# Patient Record
Sex: Female | Born: 1960
Health system: Southern US, Community
[De-identification: ages and names within clinical notes are randomized; demographics above are authoritative.]

## PROBLEM LIST (undated history)

## (undated) DIAGNOSIS — K635 Polyp of colon: Secondary | ICD-10-CM

## (undated) DIAGNOSIS — H269 Unspecified cataract: Secondary | ICD-10-CM

## (undated) DIAGNOSIS — M810 Age-related osteoporosis without current pathological fracture: Secondary | ICD-10-CM

## (undated) DIAGNOSIS — I10 Essential (primary) hypertension: Secondary | ICD-10-CM

## (undated) DIAGNOSIS — F32A Depression, unspecified: Secondary | ICD-10-CM

## (undated) DIAGNOSIS — K219 Gastro-esophageal reflux disease without esophagitis: Secondary | ICD-10-CM

## (undated) DIAGNOSIS — K900 Celiac disease: Secondary | ICD-10-CM

## (undated) DIAGNOSIS — M1990 Unspecified osteoarthritis, unspecified site: Secondary | ICD-10-CM

## (undated) DIAGNOSIS — F329 Major depressive disorder, single episode, unspecified: Secondary | ICD-10-CM

## (undated) DIAGNOSIS — R7303 Prediabetes: Secondary | ICD-10-CM

## (undated) DIAGNOSIS — F419 Anxiety disorder, unspecified: Secondary | ICD-10-CM

## (undated) DIAGNOSIS — G4730 Sleep apnea, unspecified: Secondary | ICD-10-CM

## (undated) HISTORY — DX: Polyp of colon: K63.5

## (undated) HISTORY — DX: Unspecified cataract: H26.9

## (undated) HISTORY — PX: ESOPHAGOGASTRODUODENOSCOPY: SHX1529

## (undated) HISTORY — PX: OTHER SURGICAL HISTORY: SHX169

## (undated) HISTORY — DX: Gastro-esophageal reflux disease without esophagitis: K21.9

## (undated) HISTORY — DX: Celiac disease: K90.0

## (undated) HISTORY — DX: Sleep apnea, unspecified: G47.30

## (undated) HISTORY — PX: EYE SURGERY: SHX253

## (undated) HISTORY — DX: Anxiety disorder, unspecified: F41.9

## (undated) HISTORY — PX: ABDOMINAL HYSTERECTOMY: SHX81

## (undated) HISTORY — PX: WRIST FRACTURE SURGERY: SHX121

## (undated) HISTORY — DX: Age-related osteoporosis without current pathological fracture: M81.0

---
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
## 1988-01-02 HISTORY — PX: BACK SURGERY: SHX140

## 1990-01-01 HISTORY — PX: ABDOMINAL HYSTERECTOMY: SHX81

## 2003-01-29 ENCOUNTER — Emergency Department (HOSPITAL_COMMUNITY): Admission: EM | Admit: 2003-01-29 | Discharge: 2003-01-29 | Payer: Self-pay | Admitting: Emergency Medicine

## 2003-02-22 ENCOUNTER — Ambulatory Visit (HOSPITAL_COMMUNITY): Admission: RE | Admit: 2003-02-22 | Discharge: 2003-02-23 | Payer: Self-pay | Admitting: Neurosurgery

## 2003-02-25 ENCOUNTER — Emergency Department (HOSPITAL_COMMUNITY): Admission: EM | Admit: 2003-02-25 | Discharge: 2003-02-25 | Payer: Self-pay | Admitting: Emergency Medicine

## 2015-04-28 DIAGNOSIS — B009 Herpesviral infection, unspecified: Secondary | ICD-10-CM | POA: Insufficient documentation

## 2015-04-28 DIAGNOSIS — N390 Urinary tract infection, site not specified: Secondary | ICD-10-CM | POA: Insufficient documentation

## 2015-04-28 DIAGNOSIS — I10 Essential (primary) hypertension: Secondary | ICD-10-CM | POA: Insufficient documentation

## 2017-04-08 ENCOUNTER — Other Ambulatory Visit: Payer: Self-pay | Admitting: Orthopedic Surgery

## 2017-05-03 ENCOUNTER — Encounter (HOSPITAL_COMMUNITY): Admission: RE | Payer: Self-pay | Source: Ambulatory Visit

## 2017-05-03 ENCOUNTER — Inpatient Hospital Stay (HOSPITAL_COMMUNITY): Admission: RE | Admit: 2017-05-03 | Payer: Self-pay | Source: Ambulatory Visit | Admitting: Orthopedic Surgery

## 2017-05-03 SURGERY — ARTHROPLASTY, KNEE, TOTAL
Anesthesia: Spinal | Site: Knee | Laterality: Right

## 2017-05-03 SURGERY — Surgical Case
Anesthesia: *Unknown

## 2017-09-10 DIAGNOSIS — R079 Chest pain, unspecified: Secondary | ICD-10-CM

## 2018-05-29 DIAGNOSIS — Z7189 Other specified counseling: Secondary | ICD-10-CM | POA: Diagnosis not present

## 2018-05-29 DIAGNOSIS — L089 Local infection of the skin and subcutaneous tissue, unspecified: Secondary | ICD-10-CM | POA: Diagnosis not present

## 2018-06-17 DIAGNOSIS — M79601 Pain in right arm: Secondary | ICD-10-CM | POA: Diagnosis not present

## 2018-06-17 DIAGNOSIS — S6991XA Unspecified injury of right wrist, hand and finger(s), initial encounter: Secondary | ICD-10-CM | POA: Diagnosis not present

## 2018-06-17 DIAGNOSIS — M79631 Pain in right forearm: Secondary | ICD-10-CM | POA: Diagnosis not present

## 2018-06-17 DIAGNOSIS — J029 Acute pharyngitis, unspecified: Secondary | ICD-10-CM | POA: Diagnosis not present

## 2018-06-17 DIAGNOSIS — S40021A Contusion of right upper arm, initial encounter: Secondary | ICD-10-CM | POA: Diagnosis not present

## 2018-06-19 DIAGNOSIS — M1711 Unilateral primary osteoarthritis, right knee: Secondary | ICD-10-CM | POA: Diagnosis not present

## 2018-06-19 DIAGNOSIS — M25562 Pain in left knee: Secondary | ICD-10-CM | POA: Diagnosis not present

## 2018-06-19 DIAGNOSIS — M25561 Pain in right knee: Secondary | ICD-10-CM | POA: Diagnosis not present

## 2018-06-19 DIAGNOSIS — M1712 Unilateral primary osteoarthritis, left knee: Secondary | ICD-10-CM | POA: Diagnosis not present

## 2018-06-24 DIAGNOSIS — S40021A Contusion of right upper arm, initial encounter: Secondary | ICD-10-CM | POA: Diagnosis not present

## 2018-06-24 DIAGNOSIS — J029 Acute pharyngitis, unspecified: Secondary | ICD-10-CM | POA: Diagnosis not present

## 2018-06-24 DIAGNOSIS — M79601 Pain in right arm: Secondary | ICD-10-CM | POA: Diagnosis not present

## 2018-07-03 ENCOUNTER — Other Ambulatory Visit: Payer: Self-pay | Admitting: Orthopedic Surgery

## 2018-07-08 DIAGNOSIS — F3341 Major depressive disorder, recurrent, in partial remission: Secondary | ICD-10-CM | POA: Diagnosis not present

## 2018-07-14 NOTE — Patient Instructions (Addendum)
YOU NEED TO HAVE A COVID 19 TEST ON 07-15-2018 AT 1105 AM. THIS TEST MUST BE DONE BEFORE SURGERY, COME TO [place] ENTRANCE. ONCE YOUR COVID TEST IS COMPLETED, PLEASE BEGIN THE QUARANTINE INSTRUCTIONS AS OUTLINED IN YOUR HANDOUT.                Connie Garrett    Your procedure is scheduled on: 07-18-2018   Report to [facility]  Entrance    Report to Admitting at 1255 PM     Call this number if you have problems the morning of surgery [phone]    Remember:  NO SOLID FOOD AFTER MIDNIGHT THE NIGHT PRIOR TO SURGERY. NOTHING BY MOUTH EXCEPT CLEAR LIQUIDS UNTIL 1220 PM. PLEASE FINISH ENSURE DRINK PER SURGEON ORDER 3 HOURS PRIOR TO SCHEDULED SURGERY TIME WHICH NEEDS TO BE COMPLETED AT 1220 PM.  CLEAR LIQUID DIET   Foods Allowed                                                                     Foods Excluded  Coffee and tea, regular and decaf                             liquids that you cannot  Plain Jell-O in any flavor                                             see through such as: Fruit ices (not with fruit pulp)                                     milk, soups, orange juice  Iced Popsicles                                    All solid food Carbonated beverages, regular and diet                                    Cranberry, grape and apple juices Sports drinks like Gatorade Lightly seasoned clear broth or consume(fat free) Sugar, honey syrup  Sample Menu Breakfast                                Lunch                                     Supper Cranberry juice                    Beef broth                            Chicken broth Jell-O  Grape juice                           Apple juice Coffee or tea                        Jell-O                                      Popsicle                                                Coffee or tea                        Coffee or  tea  _____________________________________________________________________     Take these medicines the morning of surgery with A SIP OF WATER: Bupropion (Wellbutrin), Desvenlafaxine (Pristiq), Escitalopram (Lexapro), and Omeprazole (Prilosec)  BRUSH YOUR TEETH MORNING OF SURGERY AND RINSE YOUR MOUTH OUT, NO CHEWING GUM CANDY OR MINTS.                                You may not have any metal on your body including hair pins and              piercings     Do not wear jewelry, make-up, lotions, powders or perfumes, deodorant               Do not wear nail polish.  Do not shave  48 hours prior to surgery.              Do not bring valuables to the hospital. [place].  Contacts, dentures or bridgework may not be worn into surgery.      _____________________________________________________________________             [facility] - Preparing for Surgery Before surgery, you can play an important role.  Because skin is not sterile, your skin needs to be as free of germs as possible.  You can reduce the number of germs on your skin by washing with CHG (chlorahexidine gluconate) soap before surgery.  CHG is an antiseptic cleaner which kills germs and bonds with the skin to continue killing germs even after washing. Please DO NOT use if you have an allergy to CHG or antibacterial soaps.  If your skin becomes reddened/irritated stop using the CHG and inform your nurse when you arrive at Short Stay. Do not shave (including legs and underarms) for at least 48 hours prior to the first CHG shower.  You may shave your face/neck. Please follow these instructions carefully:  1.  Shower with CHG Soap the night before surgery and the  morning of Surgery.  2.  If you choose to wash your hair, wash your hair first as usual with your  normal  shampoo.  3.  After you shampoo, rinse your hair and body thoroughly to remove the  shampoo.  4.  Use CHG as you would any other liquid soap.  You can apply chg directly  to the skin and wash                       Gently with a scrungie or clean washcloth.  5.  Apply the CHG Soap to your body ONLY FROM THE NECK DOWN.   Do not use on face/ open                           Wound or open sores. Avoid contact with eyes, ears mouth and genitals (private parts).                       Wash face,  Genitals (private parts) with your normal soap.             6.  Wash thoroughly, paying special attention to the area where your surgery  will be performed.  7.  Thoroughly rinse your body with warm water from the neck down.  8.  DO NOT shower/wash with your normal soap after using and rinsing off  the CHG Soap.                9.  Pat yourself dry with a clean towel.            10.  Wear clean pajamas.            11.  Place clean sheets on your bed the night of your first shower and do not  sleep with pets. Day of Surgery : Do not apply any lotions/deodorants the morning of surgery.  Please wear clean clothes to the hospital/surgery center.  FAILURE TO FOLLOW THESE INSTRUCTIONS MAY RESULT IN THE CANCELLATION OF YOUR SURGERY PATIENT SIGNATURE_________________________________  NURSE SIGNATURE__________________________________  ________________________________________________________________________   Connie Garrett  An incentive spirometer is a tool that can help keep your lungs clear and active. This tool measures how well you are filling your lungs with each breath. Taking long deep breaths may help reverse or decrease the chance of developing breathing (pulmonary) problems (especially infection) following:  A long period of time when you are unable to move or be active. BEFORE THE PROCEDURE   If the spirometer includes an indicator to show your best effort, your nurse or respiratory therapist will set it to a desired goal.  If possible, sit up straight or lean slightly forward. Try  not to slouch.  Hold the incentive spirometer in an upright position. INSTRUCTIONS FOR USE  1. Sit on the edge of your bed if possible, or sit up as far as you can in bed or on a chair. 2. Hold the incentive spirometer in an upright position. 3. Breathe out normally. 4. Place the mouthpiece in your mouth and seal your lips tightly around it. 5. Breathe in slowly and as deeply as possible, raising the piston or the ball toward the top of the column. 6. Hold your breath for 3-5 seconds or for as long as possible. Allow the piston or ball to fall to the bottom of the column. 7. Remove the mouthpiece from your mouth and breathe out normally. 8. Rest for a few seconds and repeat Steps 1 through 7 at least 10 times every 1-2 hours when you are awake. Take your time and take a few normal breaths between deep breaths. 9. The spirometer may include an indicator to show  your best effort. Use the indicator as a goal to work toward during each repetition. 10. After each set of 10 deep breaths, practice coughing to be sure your lungs are clear. If you have an incision (the cut made at the time of surgery), support your incision when coughing by placing a pillow or rolled up towels firmly against it. Once you are able to get out of bed, walk around indoors and cough well. You may stop using the incentive spirometer when instructed by your caregiver.  RISKS AND COMPLICATIONS  Take your time so you do not get dizzy or light-headed.  If you are in pain, you may need to take or ask for pain medication before doing incentive spirometry. It is harder to take a deep breath if you are having pain. AFTER USE  Rest and breathe slowly and easily.  It can be helpful to keep track of a log of your progress. Your caregiver can provide you with a simple table to help with this. If you are using the spirometer at home, follow these instructions: [place] IF:   You are having difficultly using the  spirometer.  You have trouble using the spirometer as often as instructed.  Your pain medication is not giving enough relief while using the spirometer.  You develop fever of 100.5 F (38.1 C) or higher. SEEK IMMEDIATE MEDICAL CARE IF:   You cough up bloody sputum that had not been present before.  You develop fever of 102 F (38.9 C) or greater.  You develop worsening pain at or near the incision site. MAKE SURE YOU:   Understand these instructions.  Will watch your condition.  Will get help right away if you are not doing well or get worse. Document Released: 04/30/2006 Document Revised: 03/12/2011 Document Reviewed: 07/01/2006 ExitCare Patient Information 2014 ExitCare, [place].   ________________________________________________________________________  WHAT IS A BLOOD TRANSFUSION? Blood Transfusion Information  A transfusion is the replacement of blood or some of its parts. Blood is made up of multiple cells which provide different functions.  Red blood cells carry oxygen and are used for blood loss replacement.  White blood cells fight against infection.  Platelets control bleeding.  Plasma helps clot blood.  Other blood products are available for specialized needs, such as hemophilia or other clotting disorders. BEFORE THE TRANSFUSION  Who gives blood for transfusions?   Healthy volunteers who are fully evaluated to make sure their blood is safe. This is blood bank blood. Transfusion therapy is the safest it has ever been in the practice of medicine. Before blood is taken from a donor, a complete history is taken to make sure that person has no history of diseases nor engages in risky social behavior (examples are intravenous drug use or sexual activity with multiple partners). The donor's travel history is screened to minimize risk of transmitting infections, such as malaria. The donated blood is tested for signs of infectious diseases, such as HIV and hepatitis.  The blood is then tested to be sure it is compatible with you in order to minimize the chance of a transfusion reaction. If you or a relative donates blood, this is often done in anticipation of surgery and is not appropriate for emergency situations. It takes many days to process the donated blood. RISKS AND COMPLICATIONS Although transfusion therapy is very safe and saves many lives, the main dangers of transfusion include:   Getting an infectious disease.  Developing a transfusion reaction. This is an allergic reaction to  something in the blood you were given. Every precaution is taken to prevent this. The decision to have a blood transfusion has been considered carefully by your caregiver before blood is given. Blood is not given unless the benefits outweigh the risks. AFTER THE TRANSFUSION  Right after receiving a blood transfusion, you will usually feel much better and more energetic. This is especially true if your red blood cells have gotten low (anemic). The transfusion raises the level of the red blood cells which carry oxygen, and this usually causes an energy increase.  The nurse administering the transfusion will monitor you carefully for complications. HOME CARE INSTRUCTIONS  No special instructions are needed after a transfusion. You may find your energy is better. Speak with your caregiver about any limitations on activity for underlying diseases you may have. SEEK MEDICAL CARE IF:   Your condition is not improving after your transfusion.  You develop redness or irritation at the intravenous (IV) site. SEEK IMMEDIATE MEDICAL CARE IF:  Any of the following symptoms occur over the next 12 hours:  Shaking chills.  You have a temperature by mouth above 102 F (38.9 C), not controlled by medicine.  Chest, back, or muscle pain.  People around you feel you are not acting correctly or are confused.  Shortness of breath or difficulty breathing.  Dizziness and fainting.  You  get a rash or develop hives.  You have a decrease in urine output.  Your urine turns a dark color or changes to pink, red, or brown. Any of the following symptoms occur over the next 10 days:  You have a temperature by mouth above 102 F (38.9 C), not controlled by medicine.  Shortness of breath.  Weakness after normal activity.  The white part of the eye turns yellow (jaundice).  You have a decrease in the amount of urine or are urinating less often.  Your urine turns a dark color or changes to pink, red, or brown. Document Released: 12/16/1999 Document Revised: 03/12/2011 Document Reviewed: 08/04/2007 [facility] Patient Information 2014 [place], [place].  _______________________________________________________________________

## 2018-07-15 ENCOUNTER — Other Ambulatory Visit (HOSPITAL_COMMUNITY)
Admission: RE | Admit: 2018-07-15 | Discharge: 2018-07-15 | Disposition: A | Discharge status: Home / Self Care | Payer: Medicare Other | Source: Ambulatory Visit

## 2018-07-15 ENCOUNTER — Ambulatory Visit (HOSPITAL_COMMUNITY)
Admission: RE | Admit: 2018-07-15 | Discharge: 2018-07-15 | Disposition: A | Discharge status: Home / Self Care | Payer: Medicare Other | Source: Ambulatory Visit | Attending: Orthopedic Surgery | Admitting: Orthopedic Surgery

## 2018-07-15 ENCOUNTER — Other Ambulatory Visit: Payer: Self-pay

## 2018-07-15 ENCOUNTER — Encounter (HOSPITAL_COMMUNITY): Payer: Self-pay

## 2018-07-15 ENCOUNTER — Encounter (HOSPITAL_COMMUNITY)
Admission: RE | Admit: 2018-07-15 | Discharge: 2018-07-15 | Disposition: A | Discharge status: Home / Self Care | Payer: Medicare Other | Source: Ambulatory Visit | Attending: Orthopedic Surgery | Admitting: Orthopedic Surgery

## 2018-07-15 DIAGNOSIS — Z01818 Encounter for other preprocedural examination: Secondary | ICD-10-CM | POA: Diagnosis not present

## 2018-07-15 DIAGNOSIS — M1711 Unilateral primary osteoarthritis, right knee: Secondary | ICD-10-CM | POA: Diagnosis not present

## 2018-07-15 DIAGNOSIS — Z96659 Presence of unspecified artificial knee joint: Secondary | ICD-10-CM | POA: Diagnosis not present

## 2018-07-15 DIAGNOSIS — Z01811 Encounter for preprocedural respiratory examination: Secondary | ICD-10-CM

## 2018-07-15 DIAGNOSIS — Z1159 Encounter for screening for other viral diseases: Secondary | ICD-10-CM | POA: Diagnosis not present

## 2018-07-15 HISTORY — DX: Essential (primary) hypertension: I10

## 2018-07-15 HISTORY — DX: Depression, unspecified: F32.A

## 2018-07-15 HISTORY — DX: Unspecified osteoarthritis, unspecified site: M19.90

## 2018-07-15 LAB — CBC WITH DIFFERENTIAL/PLATELET
Abs Immature Granulocytes: 0.02 10*3/uL (ref 0.00–0.07)
Basophils Absolute: 0 10*3/uL (ref 0.0–0.1)
Basophils Relative: 1 %
Eosinophils Absolute: 0.2 10*3/uL (ref 0.0–0.5)
Eosinophils Relative: 3 %
HCT: 38.6 % (ref 36.0–46.0)
Hemoglobin: 12.2 g/dL (ref 12.0–15.0)
Immature Granulocytes: 0 %
Lymphocytes Relative: 39 %
Lymphs Abs: 2.7 10*3/uL (ref 0.7–4.0)
MCH: 29.8 pg (ref 26.0–34.0)
MCHC: 31.6 g/dL (ref 30.0–36.0)
MCV: 94.1 fL (ref 80.0–100.0)
Monocytes Absolute: 0.7 10*3/uL (ref 0.1–1.0)
Monocytes Relative: 10 %
Neutro Abs: 3.4 10*3/uL (ref 1.7–7.7)
Neutrophils Relative %: 47 %
Platelets: 275 10*3/uL (ref 150–400)
RBC: 4.1 MIL/uL (ref 3.87–5.11)
RDW: 13.6 % (ref 11.5–15.5)
WBC: 7.1 10*3/uL (ref 4.0–10.5)
nRBC: 0 % (ref 0.0–0.2)

## 2018-07-15 LAB — COMPREHENSIVE METABOLIC PANEL
ALT: 15 U/L (ref 0–44)
AST: 16 U/L (ref 15–41)
Albumin: 4.2 g/dL (ref 3.5–5.0)
Alkaline Phosphatase: 67 U/L (ref 38–126)
Anion gap: 11 (ref 5–15)
BUN: 25 mg/dL — ABNORMAL HIGH (ref 6–20)
CO2: 27 mmol/L (ref 22–32)
Calcium: 9.6 mg/dL (ref 8.9–10.3)
Chloride: 102 mmol/L (ref 98–111)
Creatinine, Ser: 0.86 mg/dL (ref 0.44–1.00)
GFR calc Af Amer: >60 mL/min (ref >60)
GFR calc non Af Amer: >60 mL/min (ref >60)
Glucose, Bld: 110 mg/dL — ABNORMAL HIGH (ref 70–99)
Potassium: 4.7 mmol/L (ref 3.5–5.1)
Sodium: 140 mmol/L (ref 135–145)
Total Bilirubin: 0.8 mg/dL (ref 0.3–1.2)
Total Protein: 7.5 g/dL (ref 6.5–8.1)

## 2018-07-15 LAB — URINALYSIS, ROUTINE W REFLEX MICROSCOPIC
Bilirubin Urine: NEGATIVE
Glucose, UA: NEGATIVE mg/dL
Hgb urine dipstick: NEGATIVE
Ketones, ur: NEGATIVE mg/dL
Nitrite: NEGATIVE
Protein, ur: NEGATIVE mg/dL
Specific Gravity, Urine: 1.006 (ref 1.005–1.030)
pH: 7 (ref 5.0–8.0)

## 2018-07-15 LAB — ABO/RH: ABO/RH(D): A POS

## 2018-07-15 LAB — PROTIME-INR
INR: 1 (ref 0.8–1.2)
Prothrombin Time: 13.4 seconds (ref 11.4–15.2)

## 2018-07-15 LAB — SURGICAL PCR SCREEN
MRSA, PCR: NEGATIVE
Staphylococcus aureus: POSITIVE — AB

## 2018-07-15 LAB — APTT: aPTT: 28 seconds (ref 24–36)

## 2018-07-15 LAB — SARS CORONAVIRUS 2 (TAT 6-24 HRS): SARS Coronavirus 2: NEGATIVE

## 2018-07-15 NOTE — Progress Notes (Signed)
07-15-18 CMP and UA routed to Dr. Berenice Primas for review

## 2018-07-17 DIAGNOSIS — M25561 Pain in right knee: Secondary | ICD-10-CM | POA: Diagnosis not present

## 2018-07-17 DIAGNOSIS — Z6830 Body mass index (BMI) 30.0-30.9, adult: Secondary | ICD-10-CM | POA: Diagnosis not present

## 2018-07-17 DIAGNOSIS — Z01818 Encounter for other preprocedural examination: Secondary | ICD-10-CM | POA: Diagnosis not present

## 2018-07-17 MED ORDER — BUPIVACAINE LIPOSOME 1.3 % IJ SUSP
20.0000 mL | INTRAMUSCULAR | Status: DC
  Filled 2018-07-17: qty 20

## 2018-07-17 NOTE — Progress Notes (Signed)
Pt advised that surgery time for her 07-18-18 procedure is now 1:15 PM in lieu of 3:24 PM. Pt to arrived to admitting at 10:45 AM, and to consume ERAS drink by 10:15 AM. Pt verbalized understanding.

## 2018-07-17 NOTE — H&P (Addendum)
TOTAL KNEE ADMISSION H&P  Patient is being admitted for right total knee arthroplasty.  Subjective:  Chief Complaint:right knee pain.  HPI: Connie Garrett, 58 y.o. female, has a history of pain and functional disability in the right knee due to arthritis and has failed non-surgical conservative treatments for greater than 12 weeks to includeNSAID's and/or analgesics, corticosteriod injections, viscosupplementation injections and activity modification.  Onset of symptoms was gradual, starting 3 years ago with gradually worsening course since that time. The patient noted no past surgery on the right knee(s).  Patient currently rates pain in the right knee(s) at 7 out of 10 with activity. Patient has night pain, worsening of pain with activity and weight bearing, pain that interferes with activities of daily living and joint swelling.  Patient has evidence of subchondral sclerosis, periarticular osteophytes and joint space narrowing by imaging studies. This patient has had failure of all reasonable conservative care. There is no active infection.  There are no active problems to display for this patient.  Past Medical History:  Diagnosis Date  . Arthritis   . Depression   . Hypertension     Past Surgical History:  Procedure Laterality Date  . ABDOMINAL HYSTERECTOMY    . BACK SURGERY  1990  . EYE SURGERY      Current Facility-Administered Medications  Medication Dose Route Frequency Provider Last Rate Last Dose  . [START ON 07/18/2018] bupivacaine liposome (EXPAREL) 1.3 % injection 266 mg  20 mL Other On Call to OR Dorna Leitz, MD       Current Outpatient Medications  Medication Sig Dispense Refill Last Dose  . acetaminophen (TYLENOL) 325 MG tablet Take 650 mg by mouth every 6 (six) hours as needed (for pain.).     Marland Kitchen buPROPion (WELLBUTRIN) 100 MG tablet Take 100 mg by mouth 2 (two) times daily.     . celecoxib (CELEBREX) 200 MG capsule Take 200 mg by mouth daily with breakfast.     .  desvenlafaxine (PRISTIQ) 50 MG 24 hr tablet Take 100 mg by mouth daily.     Marland Kitchen escitalopram (LEXAPRO) 20 MG tablet Take 20 mg by mouth daily.     . metoprolol succinate (TOPROL-XL) 25 MG 24 hr tablet Take 25 mg by mouth daily.     . Multiple Vitamin (MULTIVITAMIN WITH MINERALS) TABS tablet Take 1 tablet by mouth daily.     . naproxen (NAPROSYN) 500 MG tablet Take 500 mg by mouth 2 (two) times a day.     Marland Kitchen omeprazole (PRILOSEC) 20 MG capsule Take 20 mg by mouth daily.     Marland Kitchen topiramate (TOPAMAX) 25 MG tablet Take 25 mg by mouth 2 (two) times daily.      No Known Allergies  Social History   Tobacco Use  . Smoking status: Never Smoker  . Smokeless tobacco: Never Used  Substance Use Topics  . Alcohol use: Never    Frequency: Never    No family history on file.   ROS ROS: I have reviewed the patient's review of systems thoroughly and there are no positive responses as relates to the HPI. Objective:  Physical Exam  Vital signs in last 24 hours:    Vitals:   07/18/18 1034  BP: (!) 148/77  Pulse: 91  Resp: 16  Temp: 98.8 F (37.1 C)  SpO2: 99%   Well-developed well-nourished patient in no acute distress. Alert and oriented x3 HEENT:within normal limits Cardiac: Regular rate and rhythm Pulmonary: Lungs clear to auscultation Abdomen: Soft and  nontender.  Normal active bowel sounds  Musculoskeletal: (right knee: Painful range of motion.  Limited range of motion.  No stability.  Trace effusion.  Mild varus malalignment. Labs: Recent Results (from the past 2160 hour(s))  Urinalysis, Routine w reflex microscopic     Status: Abnormal   Collection Time: 07/15/18 10:03 AM  Result Value Ref Range   Color, Urine YELLOW YELLOW   APPearance HAZY (A) CLEAR   Specific Gravity, Urine 1.006 1.005 - 1.030   pH 7.0 5.0 - 8.0   Glucose, UA NEGATIVE NEGATIVE mg/dL   Hgb urine dipstick NEGATIVE NEGATIVE   Bilirubin Urine NEGATIVE NEGATIVE   Ketones, ur NEGATIVE NEGATIVE mg/dL   Protein, ur  NEGATIVE NEGATIVE mg/dL   Nitrite NEGATIVE NEGATIVE   Leukocytes,Ua LARGE (A) NEGATIVE   RBC / HPF 6-10 0 - 5 RBC/hpf   WBC, UA 21-50 0 - 5 WBC/hpf   Bacteria, UA FEW (A) NONE SEEN   Squamous Epithelial / LPF 6-10 0 - 5   Mucus PRESENT     Comment: Performed at [facility], [place] [address] [place], [place] [address]  Surgical pcr screen     Status: Abnormal   Collection Time: 07/15/18 10:04 AM   Specimen: Nasal Swab  Result Value Ref Range   MRSA, PCR NEGATIVE NEGATIVE   Staphylococcus aureus POSITIVE (A) NEGATIVE    Comment: (NOTE) The Xpert SA Assay (FDA approved for NASAL specimens in patients 45 years of age and older), is one component of a comprehensive surveillance program. It is not intended to diagnose infection nor to guide or monitor treatment. Performed at [facility], [place] [address] [place], [place] [address]   APTT     Status: None   Collection Time: 07/15/18 10:47 AM  Result Value Ref Range   aPTT 28 24 - 36 seconds    Comment: Performed at [facility], [place] [address] [place], [place] [address]  CBC WITH DIFFERENTIAL     Status: None   Collection Time: 07/15/18 10:47 AM  Result Value Ref Range   WBC 7.1 4.0 - 10.5 K/uL   RBC 4.10 3.87 - 5.11 MIL/uL   Hemoglobin 12.2 12.0 - 15.0 g/dL   HCT 38.6 36.0 - 46.0 %   MCV 94.1 80.0 - 100.0 fL   MCH 29.8 26.0 - 34.0 pg   MCHC 31.6 30.0 - 36.0 g/dL   RDW 13.6 11.5 - 15.5 %   Platelets 275 150 - 400 K/uL   nRBC 0.0 0.0 - 0.2 %   Neutrophils Relative % 47 %   Neutro Abs 3.4 1.7 - 7.7 K/uL   Lymphocytes Relative 39 %   Lymphs Abs 2.7 0.7 - 4.0 K/uL   Monocytes Relative 10 %   Monocytes Absolute 0.7 0.1 - 1.0 K/uL   Eosinophils Relative 3 %   Eosinophils Absolute 0.2 0.0 - 0.5 K/uL   Basophils Relative 1 %   Basophils Absolute 0.0 0.0 - 0.1 K/uL   Immature Granulocytes 0 %   Abs Immature Granulocytes 0.02 0.00 - 0.07 K/uL    Comment: Performed at [facility], [place] [address], [place], [place] [address]  Comprehensive metabolic panel     Status: Abnormal   Collection Time: 07/15/18 10:47 AM  Result Value Ref Range   Sodium 140 135 - 145 mmol/L   Potassium 4.7 3.5 - 5.1 mmol/L   Chloride 102 98 - 111 mmol/L   CO2 27 22 -  32 mmol/L   Glucose, Bld 110 (H) 70 - 99 mg/dL   BUN 25 (H) 6 - 20 mg/dL   Creatinine, Ser 0.86 0.44 - 1.00 mg/dL   Calcium 9.6 8.9 - 10.3 mg/dL   Total Protein 7.5 6.5 - 8.1 g/dL   Albumin 4.2 3.5 - 5.0 g/dL   AST 16 15 - 41 U/L   ALT 15 0 - 44 U/L   Alkaline Phosphatase 67 38 - 126 U/L   Total Bilirubin 0.8 0.3 - 1.2 mg/dL   GFR calc non Af Amer >60 >60 mL/min   GFR calc Af Amer >60 >60 mL/min   Anion gap 11 5 - 15    Comment: Performed at [facility], [place] [address], [place], [place] [address]  Protime-INR     Status: None   Collection Time: 07/15/18 10:47 AM  Result Value Ref Range   Prothrombin Time 13.4 11.4 - 15.2 seconds   INR 1.0 0.8 - 1.2    Comment: (NOTE) INR goal varies based on device and disease states. Performed at [facility], [place] [address] [place], [place] [address]   Type and screen Order type and screen if day of surgery is less than 15 days from draw of preadmission visit or order morning of surgery if day of surgery is greater than 6 days from preadmission visit.     Status: None   Collection Time: 07/15/18 10:47 AM  Result Value Ref Range   ABO/RH(D) A POS    Antibody Screen NEG    Sample Expiration 07/29/2018,2359    Extend sample reason      NO TRANSFUSIONS OR PREGNANCY IN THE PAST 3 MONTHS Performed at [place] [address] [place], [place] [address]   ABO/Rh     Status: None   Collection Time: 07/15/18 10:47 AM  Result Value Ref Range   ABO/RH(D)      A POS Performed at [facility], [place] [address] [place], [place] [address]   SARS Coronavirus 2 (Performed in [facility] hospital lab)     Status: None   Collection Time: 07/15/18 12:06 PM   Specimen: Nasal Swab  Result Value Ref Range   SARS Coronavirus 2 NEGATIVE NEGATIVE    Comment: (NOTE) SARS-CoV-2 target nucleic acids are NOT DETECTED. The SARS-CoV-2 RNA is generally detectable in upper and lower respiratory specimens during the acute phase of infection. Negative results do not preclude SARS-CoV-2 infection, do not rule out co-infections with other pathogens, and should not be used as the sole basis for treatment or other patient management decisions. Negative results must be combined with clinical observations, patient history, and epidemiological information. The expected result is Negative. Fact Sheet for Patients: SugarRoll.be Fact Sheet for Healthcare Providers: https://www.woods-mathews.com/ This test is not yet approved or cleared by the [place] FDA and  has been authorized for detection and/or diagnosis of SARS-CoV-2 by FDA under an Emergency Use Authorization (EUA). This EUA will remain  in effect (meaning this test can be used) for the duration of the COVID-19 declaration under Section 56 4(b)(1) of the Act, 21 U.S.C. section 360bbb-3(b)(1), unless the authorization is terminated or revoked sooner. Performed at [facility], [place] [address] [place], [place] [address]     Estimated body mass index is 31.05 kg/m as calculated from the following:   Height as of 07/15/18: 5' 5"  (1.651 m).   Weight as of 07/15/18: 84.6 kg.  Imaging Review Plain radiographs demonstrate severe degenerative joint disease of the right knee(s). The overall alignment ismild varus. The bone quality appears to be fair for age and reported activity level.      Assessment/Plan:  End stage arthritis, right knee   The patient history, physical examination, clinical judgment of the provider and imaging studies are consistent with end stage  degenerative joint disease of the right knee(s) and total knee arthroplasty is deemed medically necessary. The treatment options including medical management, injection therapy arthroscopy and arthroplasty were discussed at length. The risks and benefits of total knee arthroplasty were presented and reviewed. The risks due to aseptic loosening, infection, stiffness, patella tracking problems, thromboembolic complications and other imponderables were discussed. The patient acknowledged the explanation, agreed to proceed with the plan and consent was signed. Patient is being admitted for inpatient treatment for surgery, pain control, PT, OT, prophylactic antibiotics, VTE prophylaxis, progressive ambulation and ADL's and discharge planning. The patient is planning to be discharged home with home health services     Patient's anticipated LOS is less than 2 midnights, meeting these requirements: - Younger than 91 - Lives within 1 hour of care - Has a competent adult at home to recover with post-op recover - NO history of  - Chronic pain requiring opiods  - Diabetes  - Coronary Artery Disease  - Heart failure  - Heart attack  - Stroke  - DVT/VTE  - Cardiac arrhythmia  - Respiratory Failure/COPD  - Renal failure  - Anemia  - Advanced Liver disease

## 2018-07-18 ENCOUNTER — Ambulatory Visit (HOSPITAL_COMMUNITY)
Admission: RE | Admit: 2018-07-18 | Discharge: 2018-07-19 | Disposition: A | Discharge status: Home Health | Payer: Medicare Other | Attending: Orthopedic Surgery | Admitting: Orthopedic Surgery

## 2018-07-18 ENCOUNTER — Encounter (HOSPITAL_COMMUNITY)
Admission: RE | Disposition: A | Discharge status: Home Health | Payer: Self-pay | Source: Home / Self Care | Attending: Orthopedic Surgery

## 2018-07-18 ENCOUNTER — Other Ambulatory Visit: Payer: Self-pay

## 2018-07-18 ENCOUNTER — Ambulatory Visit (HOSPITAL_COMMUNITY): Payer: Medicare Other | Admitting: Emergency Medicine

## 2018-07-18 ENCOUNTER — Ambulatory Visit (HOSPITAL_COMMUNITY): Payer: Medicare Other | Admitting: Certified Registered Nurse Anesthetist

## 2018-07-18 ENCOUNTER — Encounter (HOSPITAL_COMMUNITY): Payer: Self-pay | Admitting: Certified Registered Nurse Anesthetist

## 2018-07-18 DIAGNOSIS — M25761 Osteophyte, right knee: Secondary | ICD-10-CM | POA: Diagnosis not present

## 2018-07-18 DIAGNOSIS — I10 Essential (primary) hypertension: Secondary | ICD-10-CM | POA: Diagnosis not present

## 2018-07-18 DIAGNOSIS — Z96651 Presence of right artificial knee joint: Secondary | ICD-10-CM | POA: Diagnosis not present

## 2018-07-18 DIAGNOSIS — M1711 Unilateral primary osteoarthritis, right knee: Secondary | ICD-10-CM | POA: Diagnosis present

## 2018-07-18 DIAGNOSIS — Z79899 Other long term (current) drug therapy: Secondary | ICD-10-CM | POA: Insufficient documentation

## 2018-07-18 DIAGNOSIS — Z791 Long term (current) use of non-steroidal anti-inflammatories (NSAID): Secondary | ICD-10-CM | POA: Insufficient documentation

## 2018-07-18 DIAGNOSIS — F329 Major depressive disorder, single episode, unspecified: Secondary | ICD-10-CM | POA: Diagnosis not present

## 2018-07-18 DIAGNOSIS — G8918 Other acute postprocedural pain: Secondary | ICD-10-CM | POA: Diagnosis not present

## 2018-07-18 DIAGNOSIS — M25561 Pain in right knee: Secondary | ICD-10-CM | POA: Diagnosis present

## 2018-07-18 HISTORY — PX: TOTAL KNEE ARTHROPLASTY: SHX125

## 2018-07-18 LAB — TYPE AND SCREEN
ABO/RH(D): A POS
Antibody Screen: NEGATIVE

## 2018-07-18 SURGERY — ARTHROPLASTY, KNEE, TOTAL
Anesthesia: Spinal | Site: Knee | Laterality: Right

## 2018-07-18 MED ORDER — DEXAMETHASONE SODIUM PHOSPHATE 10 MG/ML IJ SOLN
10.0000 mg | Freq: Two times a day (BID) | INTRAMUSCULAR | Status: DC
  Administered 2018-07-19: 11:00:00 10 mg via INTRAVENOUS
  Filled 2018-07-18: qty 1

## 2018-07-18 MED ORDER — BUPIVACAINE-EPINEPHRINE 0.5% -1:200000 IJ SOLN
INTRAMUSCULAR | Status: DC | PRN
  Administered 2018-07-18: 30 mL

## 2018-07-18 MED ORDER — BUPROPION HCL 100 MG PO TABS
100.0000 mg | ORAL_TABLET | Freq: Two times a day (BID) | ORAL | Status: DC
  Administered 2018-07-18 – 2018-07-19 (×2): 100 mg via ORAL
  Filled 2018-07-18 (×2): qty 1

## 2018-07-18 MED ORDER — GABAPENTIN 300 MG PO CAPS
300.0000 mg | ORAL_CAPSULE | Freq: Two times a day (BID) | ORAL | Status: DC
  Administered 2018-07-18 – 2018-07-19 (×2): 300 mg via ORAL
  Filled 2018-07-18 (×2): qty 1

## 2018-07-18 MED ORDER — CEFAZOLIN SODIUM-DEXTROSE 2-4 GM/100ML-% IV SOLN
2.0000 g | Freq: Four times a day (QID) | INTRAVENOUS | Status: AC
  Administered 2018-07-18 – 2018-07-19 (×2): 2 g via INTRAVENOUS
  Filled 2018-07-18 (×2): qty 100

## 2018-07-18 MED ORDER — TRANEXAMIC ACID-NACL 1000-0.7 MG/100ML-% IV SOLN
1000.0000 mg | Freq: Once | INTRAVENOUS | Status: AC
  Administered 2018-07-18: 1000 mg via INTRAVENOUS
  Filled 2018-07-18: qty 100

## 2018-07-18 MED ORDER — PROPOFOL 10 MG/ML IV BOLUS
INTRAVENOUS | Status: AC
  Filled 2018-07-18: qty 60

## 2018-07-18 MED ORDER — ONDANSETRON HCL 4 MG/2ML IJ SOLN
INTRAMUSCULAR | Status: AC
  Filled 2018-07-18: qty 2

## 2018-07-18 MED ORDER — DEXAMETHASONE SODIUM PHOSPHATE 10 MG/ML IJ SOLN
INTRAMUSCULAR | Status: AC
  Filled 2018-07-18: qty 1

## 2018-07-18 MED ORDER — POLYETHYLENE GLYCOL 3350 17 G PO PACK: 17.0000 g | PACK | Freq: Every day | ORAL | Status: DC | PRN

## 2018-07-18 MED ORDER — ROPIVACAINE HCL 7.5 MG/ML IJ SOLN
INTRAMUSCULAR | Status: DC | PRN
  Administered 2018-07-18: 20 mL via PERINEURAL

## 2018-07-18 MED ORDER — DOCUSATE SODIUM 100 MG PO CAPS
100.0000 mg | ORAL_CAPSULE | Freq: Two times a day (BID) | ORAL | Status: DC
  Administered 2018-07-18 – 2018-07-19 (×2): 100 mg via ORAL
  Filled 2018-07-18 (×2): qty 1

## 2018-07-18 MED ORDER — PHENYLEPHRINE 40 MCG/ML (10ML) SYRINGE FOR IV PUSH (FOR BLOOD PRESSURE SUPPORT)
PREFILLED_SYRINGE | INTRAVENOUS | Status: AC
  Filled 2018-07-18: qty 10

## 2018-07-18 MED ORDER — MAGNESIUM CITRATE PO SOLN: 1.0000 | Freq: Once | ORAL | Status: DC | PRN

## 2018-07-18 MED ORDER — PROPOFOL 500 MG/50ML IV EMUL
INTRAVENOUS | Status: DC | PRN
  Administered 2018-07-18: 100 ug/kg/min via INTRAVENOUS

## 2018-07-18 MED ORDER — MIDAZOLAM HCL 2 MG/2ML IJ SOLN
INTRAMUSCULAR | Status: AC
  Administered 2018-07-18: 2 mg
  Filled 2018-07-18: qty 2

## 2018-07-18 MED ORDER — SODIUM CHLORIDE 0.9% FLUSH
INTRAVENOUS | Status: DC | PRN
  Administered 2018-07-18: 50 mL

## 2018-07-18 MED ORDER — CEFAZOLIN SODIUM-DEXTROSE 2-4 GM/100ML-% IV SOLN
2.0000 g | INTRAVENOUS | Status: AC
  Administered 2018-07-18: 2 g via INTRAVENOUS
  Filled 2018-07-18: qty 100

## 2018-07-18 MED ORDER — CLONIDINE HCL (ANALGESIA) 100 MCG/ML EP SOLN
EPIDURAL | Status: DC | PRN
  Administered 2018-07-18: 80 ug

## 2018-07-18 MED ORDER — ONDANSETRON HCL 4 MG/2ML IJ SOLN: 4.0000 mg | Freq: Four times a day (QID) | INTRAMUSCULAR | Status: DC | PRN

## 2018-07-18 MED ORDER — POVIDONE-IODINE 10 % EX SWAB
2.0000 "application " | Freq: Once | CUTANEOUS | Status: AC
  Administered 2018-07-18: 2 via TOPICAL

## 2018-07-18 MED ORDER — SODIUM CHLORIDE 0.9 % IV SOLN
INTRAVENOUS | Status: DC
  Administered 2018-07-18 – 2018-07-19 (×2): via INTRAVENOUS

## 2018-07-18 MED ORDER — SODIUM CHLORIDE 0.9 % IV SOLN
INTRAVENOUS | Status: DC | PRN
  Administered 2018-07-18: 13:00:00 30 ug/min via INTRAVENOUS

## 2018-07-18 MED ORDER — 0.9 % SODIUM CHLORIDE (POUR BTL) OPTIME
TOPICAL | Status: DC | PRN
  Administered 2018-07-18: 13:00:00 1000 mL

## 2018-07-18 MED ORDER — HYDROMORPHONE HCL 1 MG/ML IJ SOLN: 0.2500 mg | INTRAMUSCULAR | Status: DC | PRN

## 2018-07-18 MED ORDER — ONDANSETRON HCL 4 MG PO TABS: 4.0000 mg | ORAL_TABLET | Freq: Four times a day (QID) | ORAL | Status: DC | PRN

## 2018-07-18 MED ORDER — SODIUM CHLORIDE 0.9 % IR SOLN
Status: DC | PRN
  Administered 2018-07-18: 1000 mL

## 2018-07-18 MED ORDER — CHLORHEXIDINE GLUCONATE 4 % EX LIQD: 60.0000 mL | Freq: Once | CUTANEOUS | Status: DC

## 2018-07-18 MED ORDER — OXYCODONE-ACETAMINOPHEN 5-325 MG PO TABS: 1.0000 | ORAL_TABLET | Freq: Four times a day (QID) | ORAL | 0 refills | Status: DC | PRN

## 2018-07-18 MED ORDER — PANTOPRAZOLE SODIUM 40 MG PO TBEC
40.0000 mg | DELAYED_RELEASE_TABLET | Freq: Every day | ORAL | Status: DC
  Administered 2018-07-19: 40 mg via ORAL
  Filled 2018-07-18: qty 1

## 2018-07-18 MED ORDER — DOCUSATE SODIUM 100 MG PO CAPS: 100.0000 mg | ORAL_CAPSULE | Freq: Two times a day (BID) | ORAL | 0 refills | Status: DC

## 2018-07-18 MED ORDER — METHOCARBAMOL 500 MG PO TABS
500.0000 mg | ORAL_TABLET | Freq: Four times a day (QID) | ORAL | Status: DC | PRN
  Administered 2018-07-18 – 2018-07-19 (×4): 500 mg via ORAL
  Filled 2018-07-18 (×4): qty 1

## 2018-07-18 MED ORDER — WATER FOR IRRIGATION, STERILE IR SOLN
Status: DC | PRN
  Administered 2018-07-18 (×2): 1000 mL

## 2018-07-18 MED ORDER — PROMETHAZINE HCL 25 MG/ML IJ SOLN: 6.2500 mg | INTRAMUSCULAR | Status: DC | PRN

## 2018-07-18 MED ORDER — TRANEXAMIC ACID-NACL 1000-0.7 MG/100ML-% IV SOLN
1000.0000 mg | INTRAVENOUS | Status: DC
  Filled 2018-07-18: qty 100

## 2018-07-18 MED ORDER — LACTATED RINGERS IV SOLN
INTRAVENOUS | Status: DC
  Administered 2018-07-18 (×2): via INTRAVENOUS

## 2018-07-18 MED ORDER — PROPOFOL 10 MG/ML IV BOLUS
INTRAVENOUS | Status: AC
  Filled 2018-07-18: qty 20

## 2018-07-18 MED ORDER — HYDROMORPHONE HCL 1 MG/ML IJ SOLN: 0.5000 mg | INTRAMUSCULAR | Status: DC | PRN

## 2018-07-18 MED ORDER — CELECOXIB 200 MG PO CAPS
200.0000 mg | ORAL_CAPSULE | Freq: Two times a day (BID) | ORAL | Status: DC
  Administered 2018-07-18 – 2018-07-19 (×2): 200 mg via ORAL
  Filled 2018-07-18 (×2): qty 1

## 2018-07-18 MED ORDER — BISACODYL 5 MG PO TBEC: 5.0000 mg | DELAYED_RELEASE_TABLET | Freq: Every day | ORAL | Status: DC | PRN

## 2018-07-18 MED ORDER — ASPIRIN EC 325 MG PO TBEC
325.0000 mg | DELAYED_RELEASE_TABLET | Freq: Two times a day (BID) | ORAL | Status: DC
  Administered 2018-07-19: 11:00:00 325 mg via ORAL
  Filled 2018-07-18: qty 1

## 2018-07-18 MED ORDER — ONDANSETRON HCL 4 MG/2ML IJ SOLN
INTRAMUSCULAR | Status: DC | PRN
  Administered 2018-07-18: 4 mg via INTRAVENOUS

## 2018-07-18 MED ORDER — ESCITALOPRAM OXALATE 20 MG PO TABS
20.0000 mg | ORAL_TABLET | Freq: Every day | ORAL | Status: DC
  Administered 2018-07-19: 20 mg via ORAL
  Filled 2018-07-18: qty 1

## 2018-07-18 MED ORDER — DESVENLAFAXINE SUCCINATE ER 50 MG PO TB24
100.0000 mg | ORAL_TABLET | Freq: Every day | ORAL | Status: DC
  Administered 2018-07-19: 100 mg via ORAL
  Filled 2018-07-18: qty 2

## 2018-07-18 MED ORDER — DIPHENHYDRAMINE HCL 12.5 MG/5ML PO ELIX: 12.5000 mg | ORAL_SOLUTION | ORAL | Status: DC | PRN

## 2018-07-18 MED ORDER — TIZANIDINE HCL 2 MG PO TABS: 2.0000 mg | ORAL_TABLET | Freq: Three times a day (TID) | ORAL | 0 refills | Status: DC | PRN

## 2018-07-18 MED ORDER — ASPIRIN EC 325 MG PO TBEC: 325.0000 mg | DELAYED_RELEASE_TABLET | Freq: Two times a day (BID) | ORAL | 0 refills | Status: DC

## 2018-07-18 MED ORDER — PROPOFOL 10 MG/ML IV BOLUS
INTRAVENOUS | Status: DC | PRN
  Administered 2018-07-18 (×2): 30 mg via INTRAVENOUS

## 2018-07-18 MED ORDER — METHOCARBAMOL 1000 MG/10ML IJ SOLN
500.0000 mg | Freq: Four times a day (QID) | INTRAVENOUS | Status: DC | PRN
  Filled 2018-07-18: qty 5

## 2018-07-18 MED ORDER — BUPIVACAINE LIPOSOME 1.3 % IJ SUSP
INTRAMUSCULAR | Status: DC | PRN
  Administered 2018-07-18: 20 mL

## 2018-07-18 MED ORDER — MEPERIDINE HCL 50 MG/ML IJ SOLN: 6.2500 mg | INTRAMUSCULAR | Status: DC | PRN

## 2018-07-18 MED ORDER — DEXAMETHASONE SODIUM PHOSPHATE 10 MG/ML IJ SOLN
INTRAMUSCULAR | Status: DC | PRN
  Administered 2018-07-18: 5 mg via INTRAVENOUS

## 2018-07-18 MED ORDER — FENTANYL CITRATE (PF) 100 MCG/2ML IJ SOLN
INTRAMUSCULAR | Status: AC
  Administered 2018-07-18: 50 ug
  Filled 2018-07-18: qty 2

## 2018-07-18 MED ORDER — OXYCODONE HCL 5 MG PO TABS
5.0000 mg | ORAL_TABLET | ORAL | Status: DC | PRN
  Administered 2018-07-18 (×2): 5 mg via ORAL
  Administered 2018-07-19 (×3): 10 mg via ORAL
  Filled 2018-07-18: qty 2
  Filled 2018-07-18: qty 1
  Filled 2018-07-18 (×2): qty 2
  Filled 2018-07-18: qty 1

## 2018-07-18 MED ORDER — ALUM & MAG HYDROXIDE-SIMETH 200-200-20 MG/5ML PO SUSP: 30.0000 mL | ORAL | Status: DC | PRN

## 2018-07-18 MED ORDER — ACETAMINOPHEN 325 MG PO TABS: 325.0000 mg | ORAL_TABLET | Freq: Four times a day (QID) | ORAL | Status: DC | PRN

## 2018-07-18 SURGICAL SUPPLY — 54 items
APL SKNCLS STERI-STRIP NONHPOA (GAUZE/BANDAGES/DRESSINGS) ×1
ATTUNE PSFEM RTSZ5 NARCEM KNEE (Femur) ×2 IMPLANT
ATTUNE PSRP INSR SZ5 8 KNEE (Insert) ×1 IMPLANT
ATTUNE PSRP INSR SZ5 8MM KNEE (Insert) ×1 IMPLANT
BAG SPEC THK2 15X12 ZIP CLS (MISCELLANEOUS) ×1
BAG ZIPLOCK 12X15 (MISCELLANEOUS) ×3 IMPLANT
BASEPLATE TIBIAL ROTATING SZ 4 (Knees) ×2 IMPLANT
BENZOIN TINCTURE PRP APPL 2/3 (GAUZE/BANDAGES/DRESSINGS) ×3 IMPLANT
BLADE SAGITTAL 25.0X1.19X90 (BLADE) ×2 IMPLANT
BLADE SAGITTAL 25.0X1.19X90MM (BLADE) ×1
BLADE SAW SGTL 11.0X1.19X90.0M (BLADE) ×3 IMPLANT
BLADE SURG SZ10 CARB STEEL (BLADE) ×6 IMPLANT
BNDG ELASTIC 6X5.8 VLCR STR LF (GAUZE/BANDAGES/DRESSINGS) ×2 IMPLANT
BOOTIES KNEE HIGH SLOAN (MISCELLANEOUS) ×3 IMPLANT
BOWL SMART MIX CTS (DISPOSABLE) ×3 IMPLANT
BSPLAT TIB 4 CMNT ROT PLAT STR (Knees) ×1 IMPLANT
CEMENT HV SMART SET (Cement) ×6 IMPLANT
CLOSURE WOUND 1/2 X4 (GAUZE/BANDAGES/DRESSINGS)
COVER SURGICAL LIGHT HANDLE (MISCELLANEOUS) ×3 IMPLANT
COVER WAND RF STERILE (DRAPES) IMPLANT
CUFF TOURN SGL QUICK 34 (TOURNIQUET CUFF) ×3
CUFF TRNQT CYL 34X4.125X (TOURNIQUET CUFF) ×1 IMPLANT
DECANTER SPIKE VIAL GLASS SM (MISCELLANEOUS) ×6 IMPLANT
DRAPE U-SHAPE 47X51 STRL (DRAPES) ×3 IMPLANT
DRSG AQUACEL AG ADV 3.5X10 (GAUZE/BANDAGES/DRESSINGS) ×3 IMPLANT
DURAPREP 26ML APPLICATOR (WOUND CARE) ×3 IMPLANT
ELECT REM PT RETURN 15FT ADLT (MISCELLANEOUS) ×3 IMPLANT
GLOVE BIOGEL PI IND STRL 8 (GLOVE) ×2 IMPLANT
GLOVE BIOGEL PI INDICATOR 8 (GLOVE) ×4
GLOVE ECLIPSE 7.5 STRL STRAW (GLOVE) ×6 IMPLANT
GOWN STRL REUS W/TWL XL LVL3 (GOWN DISPOSABLE) ×6 IMPLANT
HANDPIECE INTERPULSE COAX TIP (DISPOSABLE) ×3
HOLDER FOLEY CATH W/STRAP (MISCELLANEOUS) IMPLANT
HOOD PEEL AWAY FLYTE STAYCOOL (MISCELLANEOUS) ×9 IMPLANT
KIT TURNOVER KIT A (KITS) IMPLANT
MANIFOLD NEPTUNE II (INSTRUMENTS) ×3 IMPLANT
NEEDLE HYPO 22GX1.5 SAFETY (NEEDLE) ×3 IMPLANT
NS IRRIG 1000ML POUR BTL (IV SOLUTION) ×3 IMPLANT
PACK ICE MAXI GEL EZY WRAP (MISCELLANEOUS) ×3 IMPLANT
PACK TOTAL KNEE CUSTOM (KITS) ×3 IMPLANT
PADDING CAST COTTON 6X4 STRL (CAST SUPPLIES) ×3 IMPLANT
PATELLA MEDIAL ATTUN 35MM KNEE (Knees) ×2 IMPLANT
PIN STEINMAN FIXATION KNEE (PIN) ×2 IMPLANT
PIN THREADED HEADED SIGMA (PIN) ×2 IMPLANT
PROTECTOR NERVE ULNAR (MISCELLANEOUS) ×3 IMPLANT
SET HNDPC FAN SPRY TIP SCT (DISPOSABLE) ×1 IMPLANT
STRIP CLOSURE SKIN 1/2X4 (GAUZE/BANDAGES/DRESSINGS) IMPLANT
SUT MNCRL AB 3-0 PS2 18 (SUTURE) ×3 IMPLANT
SUT VIC AB 0 CT1 36 (SUTURE) ×3 IMPLANT
SUT VIC AB 1 CT1 36 (SUTURE) ×6 IMPLANT
SYR CONTROL 10ML LL (SYRINGE) ×6 IMPLANT
TRAY FOLEY MTR SLVR 16FR STAT (SET/KITS/TRAYS/PACK) ×3 IMPLANT
WATER STERILE IRR 1000ML POUR (IV SOLUTION) ×6 IMPLANT
YANKAUER SUCT BULB TIP 10FT TU (MISCELLANEOUS) ×3 IMPLANT

## 2018-07-18 NOTE — Discharge Instructions (Signed)

## 2018-07-18 NOTE — Op Note (Signed)
PATIENT ID:      Connie Garrett  MRN:     [id number] DOB/AGE:    [birth year] / 56 y.o.       OPERATIVE REPORT    DATE OF PROCEDURE:  07/18/2018       PREOPERATIVE DIAGNOSIS:   RIGHT KNEE OSTEOARTHRITIS      Estimated body mass index is 31.05 kg/m as calculated from the following:   Height as of 07/15/18: 5' 5"  (1.651 m).   Weight as of 07/15/18: 84.6 kg.                                                        POSTOPERATIVE DIAGNOSIS:   RIGHT KNEE OSTEOARTHRITIS                                                                      PROCEDURE:  Procedure(s): TOTAL KNEE ARTHROPLASTY Using DepuyAttune RP implants #5N Femur, #4Tibia, 8 mm Attune RP bearing, 35 Patella     SURGEON: [name]    ASSISTANT:   Gaspar Skeeters PA-C   (Present and scrubbed throughout the case, critical for assistance with exposure, retraction, instrumentation, and closure.)         ANESTHESIA: spinal, 20cc Exparel, 50cc 0.25% Marcaine  EBL: minimal cc  FLUID REPLACEMENT: per anesthesia cc crystaloid  Tourniquet Time: None  Drains: None  Tranexamic Acid: 1gm IV, 2gm topical  Exparel: 510CH   COMPLICATIONS:  None         INDICATIONS FOR PROCEDURE: The patient has  RIGHT KNEE OSTEOARTHRITIS, mild varus deformities, XR shows bone on bone arthritis, lateral subluxation of tibia. Patient has failed all conservative measures including anti-inflammatory medicines, narcotics, attempts at exercise and weight loss, cortisone injections and viscosupplementation.  Risks and benefits of surgery have been discussed, questions answered.   DESCRIPTION OF PROCEDURE: The patient identified by armband, received  IV antibiotics, in the holding area at [facility]. Patient taken to the operating room, appropriate anesthetic monitors were attached, and spinal anesthesia was  induced. IV Tranexamic acid was given.Tourniquet applied high to the operative thigh. Lateral post and foot positioner applied to the table, the lower extremity  was then prepped and draped in usual sterile fashion from the toes to the tourniquet. Time-out procedure was performed. The skin and subcutaneous tissue along the incision was injected with 20 cc of a mixture of Exparel and Marcaine solution, using a 20-gauge by 1-1/2 inch needle. We began the operation, with the knee flexed 130 degrees, by making the anterior midline incision starting at handbreadth above the patella going over the patella 1 cm medial to and 4 cm distal to the tibial tubercle. Small bleeders in the skin and the subcutaneous tissue identified and cauterized. Transverse retinaculum was incised and reflected medially and a medial parapatellar arthrotomy was accomplished. the patella was everted and theprepatellar fat pad resected. The superficial medial collateral ligament was then elevated from anterior to posterior along the proximal flare of the tibia and anterior half of the menisci resected. The knee was hyperflexed exposing bone on bone  arthritis. Peripheral and notch osteophytes as well as the cruciate ligaments were then resected. We continued to work our way around posteriorly along the proximal tibia, and externally rotated the tibia subluxing it out from underneath the femur. A McHale retractor was placed through the notch and a lateral Hohmann retractor placed, and we then drilled through the proximal tibia in line with the axis of the tibia followed by an intramedullary guide rod and 2-degree posterior slope cutting guide. The tibial cutting guide, 4 degree posterior sloped, was pinned into place allowing resection of 2 mm of bone medially and 10 mm of bone laterally. Satisfied with the tibial resection, we then entered the distal femur 2 mm anterior to the PCL origin with the intramedullary guide rod and applied the distal femoral cutting guide set at 9 mm, with 5 degrees of valgus. This was pinned along the epicondylar axis. At this point, the distal femoral cut was accomplished without  difficulty. We then sized for a #5N femoral component and pinned the guide in 3 degrees of external rotation. The chamfer cutting guide was pinned into place. The anterior, posterior, and chamfer cuts were accomplished without difficulty followed by the Attune RP box cutting guide and the box cut. We also removed posterior osteophytes from the posterior femoral condyles. The posterior capsule was injected with Exparel solution. The knee was brought into full extension. We checked our extension gap and fit a 8 mm bearing. Distracting in extension with a lamina spreader,  bleeders in the posterior capsule, Posterior medial and posterior lateral right down and cauterized.  The transexamic acid-soaked sponge was then placed in the gap of the knee and extension. The knee was flexed 30. The posterior patella cut was accomplished with the 9.5 mm Attune cutting guide, sized for a 33m dome, and the fixation pegs drilled.The knee was then once again hyperflexed exposing the proximal tibia. We sized for a # 4 tibial base plate, applied the smokestack and the conical reamer followed by the the Delta fin keel punch. We then hammered into place the Attune RP trial femoral component, drilled the lugs, inserted a  8 mm trial bearing, trial patellar button, and took the knee through range of motion from 0-130 degrees. Medial and lateral ligamentous stability was checked. No thumb pressure was required for patellar Tracking. The tourniquet was less than 1 hr. All trial components were removed, mating surfaces irrigated with pulse lavage, and dried with suction and sponges. 10 cc of the Exparel solution was applied to the cancellus bone of the patella distal femur and proximal tibia.  After waiting 30 seconds, the bony surfaces were again, dried with sponges. A double batch of DePuy HV cement was mixed and applied to all bony metallic mating surfaces except for the posterior condyles of the femur itself. In order, we hammered into  place the tibial tray and removed excess cement, the femoral component and removed excess cement. The final Attune RP bearing was inserted, and the knee brought to full extension with compression. The patellar button was clamped into place, and excess cement removed. The knee was held at 30 flexion with compression, while the cement cured. The wound was irrigated out with normal saline solution pulse lavage. The rest of the Exparel was injected into the parapatellar arthrotomy, subcutaneous tissues, and periosteal tissues. The parapatellar arthrotomy was closed with running #1 Vicryl suture. The subcutaneous tissue with 0 and 2-0 undyed Vicryl suture, and the skin with running 3-0 SQ vicryl. An Aquacil and  Ace wrap were applied. The patient was taken to recovery room without difficulty.   [name] 07/18/2018, 3:44 PM

## 2018-07-18 NOTE — Anesthesia Preprocedure Evaluation (Signed)
Anesthesia Evaluation  Patient identified by MRN, date of birth, ID band Patient awake    Reviewed: Allergy & Precautions, NPO status , Patient's Chart, lab work & pertinent test results, reviewed documented beta blocker date and time   Airway Mallampati: II  TM Distance: >3 FB Neck ROM: Full    Dental no notable dental hx.    Pulmonary neg pulmonary ROS,    Pulmonary exam normal breath sounds clear to auscultation       Cardiovascular hypertension, Pt. on medications and Pt. on home beta blockers Normal cardiovascular exam Rhythm:Regular Rate:Normal     Neuro/Psych negative neurological ROS  negative psych ROS   GI/Hepatic Neg liver ROS, Medicated,  Endo/Other  negative endocrine ROS  Renal/GU negative Renal ROS     Musculoskeletal  (+) Arthritis ,   Abdominal   Peds  Hematology negative hematology ROS (+)   Anesthesia Other Findings   Reproductive/Obstetrics                             Anesthesia Physical Anesthesia Plan  ASA: II  Anesthesia Plan: Spinal   Post-op Pain Management:  Regional for Post-op pain   Induction: Intravenous  PONV Risk Score and Plan: 3 and Ondansetron, Dexamethasone, Midazolam, Treatment may vary due to age or medical condition and Propofol infusion  Airway Management Planned:   Additional Equipment:   Intra-op Plan:   Post-operative Plan:   Informed Consent: I have reviewed the patients History and Physical, chart, labs and discussed the procedure including the risks, benefits and alternatives for the proposed anesthesia with the patient or authorized representative who has indicated his/her understanding and acceptance.     Dental advisory given  Plan Discussed with: CRNA  Anesthesia Plan Comments:         Anesthesia Quick Evaluation

## 2018-07-18 NOTE — Progress Notes (Signed)
Assisted Dr. Germeroth with right, ultrasound guided, adductor canal block. Side rails up, monitors on throughout procedure. See vital signs in flow sheet. Tolerated Procedure well. 

## 2018-07-18 NOTE — Anesthesia Procedure Notes (Signed)
Anesthesia Regional Block: Adductor canal block   Pre-Anesthetic Checklist: ,, timeout performed, Correct Patient, Correct Site, Correct Laterality, Correct Procedure, Correct Position, site marked, Risks and benefits discussed,  Surgical consent,  Pre-op evaluation,  At surgeon's request and post-op pain management  Laterality: Right  Prep: chloraprep       Needles:  Injection technique: Single-shot  Needle Type: Stimiplex     Needle Length: 9cm  Needle Gauge: 21     Additional Needles:   Procedures:,,,, ultrasound used (permanent image in chart),,,,  Narrative:  Start time: 07/18/2018 12:00 PM End time: 07/18/2018 12:06 PM Injection made incrementally with aspirations every 5 mL.  Performed by: Personally  Anesthesiologist: Nolon Nations, MD  Additional Notes: BP cuff, EKG monitors applied. Sedation begun. Artery and nerve location verified with U/S and anesthetic injected incrementally, slowly, and after negative aspirations under direct u/s guidance. Good fascial /perineural spread. Tolerated well.

## 2018-07-18 NOTE — Interval H&P Note (Signed)
History and Physical Interval Note:  07/18/2018 11:37 AM  Connie Garrett  has presented today for surgery, with the diagnosis of RIGHT KNEE OSTEOARTHRITIS.  The various methods of treatment have been discussed with the patient and family. After consideration of risks, benefits and other options for treatment, the patient has consented to  Procedure(s): TOTAL KNEE ARTHROPLASTY (Right) as a surgical intervention.  The patient's history has been reviewed, patient examined, no change in status, stable for surgery.  I have reviewed the patient's chart and labs.  Questions were answered to the patient's satisfaction.     [name]

## 2018-07-18 NOTE — Transfer of Care (Signed)
Immediate Anesthesia Transfer of Care Note  Patient: Kariss Longmire  Procedure(s) Performed: TOTAL KNEE ARTHROPLASTY (Right Knee)  Patient Location: PACU  Anesthesia Type:Spinal  Level of Consciousness: awake and patient cooperative  Airway & Oxygen Therapy: Patient Spontanous Breathing and Patient connected to face mask  Post-op Assessment: Report given to RN and Post -op Vital signs reviewed and stable  Post vital signs: Reviewed and stable  Last Vitals:  Vitals Value Taken Time  BP 97/70 07/18/18 1435  Temp    Pulse 93 07/18/18 1437  Resp 22 07/18/18 1437  SpO2 97 % 07/18/18 1437  Vitals shown include unvalidated device data.  Last Pain:  Vitals:   07/18/18 1034  TempSrc: Oral         Complications: No apparent anesthesia complications

## 2018-07-18 NOTE — Anesthesia Postprocedure Evaluation (Signed)
Anesthesia Post Note  Patient: Connie Garrett  Procedure(s) Performed: TOTAL KNEE ARTHROPLASTY (Right Knee)     Patient location during evaluation: PACU Anesthesia Type: Spinal Level of consciousness: awake and alert Pain management: pain level controlled Vital Signs Assessment: post-procedure vital signs reviewed and stable Respiratory status: spontaneous breathing Cardiovascular status: stable Anesthetic complications: no    Last Vitals:  Vitals:   07/18/18 1600 07/18/18 1615  BP: (!) 96/54 (!) 97/57  Pulse: 77 82  Resp: 14 13  Temp:  36.5 C  SpO2: 99% 100%    Last Pain:  Vitals:   07/18/18 1545  TempSrc:   PainSc: 0-No pain                 Nolon Nations

## 2018-07-18 NOTE — Anesthesia Procedure Notes (Signed)
Spinal  Patient location during procedure: OR Start time: 07/18/2018 12:50 PM End time: 07/18/2018 12:54 PM Staffing Anesthesiologist: Nolon Nations, MD Performed: anesthesiologist  Preanesthetic Checklist Completed: patient identified, site marked, surgical consent, pre-op evaluation, timeout performed, IV checked, risks and benefits discussed and monitors and equipment checked Spinal Block Patient position: sitting Prep: DuraPrep Patient monitoring: heart rate, cardiac monitor, continuous pulse ox and blood pressure Approach: right paramedian Location: L3-4 Injection technique: single-shot Needle Needle type: Sprotte  Needle gauge: 24 G Needle length: 9 cm Assessment Sensory level: T4 Additional Notes Attempt initially by CRNA, midline.

## 2018-07-19 DIAGNOSIS — Z791 Long term (current) use of non-steroidal anti-inflammatories (NSAID): Secondary | ICD-10-CM | POA: Diagnosis not present

## 2018-07-19 DIAGNOSIS — M1711 Unilateral primary osteoarthritis, right knee: Secondary | ICD-10-CM | POA: Diagnosis not present

## 2018-07-19 DIAGNOSIS — F329 Major depressive disorder, single episode, unspecified: Secondary | ICD-10-CM | POA: Diagnosis not present

## 2018-07-19 DIAGNOSIS — M25761 Osteophyte, right knee: Secondary | ICD-10-CM | POA: Diagnosis not present

## 2018-07-19 DIAGNOSIS — I10 Essential (primary) hypertension: Secondary | ICD-10-CM | POA: Diagnosis not present

## 2018-07-19 DIAGNOSIS — Z96651 Presence of right artificial knee joint: Secondary | ICD-10-CM | POA: Diagnosis not present

## 2018-07-19 LAB — CBC
HCT: 31.2 % — ABNORMAL LOW (ref 36.0–46.0)
Hemoglobin: 9.7 g/dL — ABNORMAL LOW (ref 12.0–15.0)
MCH: 29.2 pg (ref 26.0–34.0)
MCHC: 31.1 g/dL (ref 30.0–36.0)
MCV: 94 fL (ref 80.0–100.0)
Platelets: 233 10*3/uL (ref 150–400)
RBC: 3.32 MIL/uL — ABNORMAL LOW (ref 3.87–5.11)
RDW: 13.5 % (ref 11.5–15.5)
WBC: 12.6 10*3/uL — ABNORMAL HIGH (ref 4.0–10.5)
nRBC: 0 % (ref 0.0–0.2)

## 2018-07-19 MED ORDER — TIZANIDINE HCL 2 MG PO TABS: 2.0000 mg | ORAL_TABLET | Freq: Three times a day (TID) | ORAL | 0 refills | Status: DC | PRN

## 2018-07-19 MED ORDER — ASPIRIN EC 325 MG PO TBEC: 325.0000 mg | DELAYED_RELEASE_TABLET | Freq: Two times a day (BID) | ORAL | 0 refills | Status: DC

## 2018-07-19 MED ORDER — DOCUSATE SODIUM 100 MG PO CAPS: 100.0000 mg | ORAL_CAPSULE | Freq: Two times a day (BID) | ORAL | 0 refills | Status: DC

## 2018-07-19 MED ORDER — OXYCODONE-ACETAMINOPHEN 5-325 MG PO TABS: 1.0000 | ORAL_TABLET | Freq: Four times a day (QID) | ORAL | 0 refills | Status: DC | PRN

## 2018-07-19 NOTE — Progress Notes (Addendum)
   PATIENT ID: Rudi Coco   1 Day Post-Op Procedure(s) (LRB): TOTAL KNEE ARTHROPLASTY (Right)  Subjective: Doing well, min discomfort, hopes to go home today.   Objective:  Vitals:   07/19/18 0108 07/19/18 0548  BP: (!) 91/54 139/68  Pulse: 78 80  Resp: 20 16  Temp: 98.3 F (36.8 C) 98.2 F (36.8 C)  SpO2: 96% 97%     R knee dressing c/d/i Wiggles toes, distally NVI  Labs:  Recent Labs    07/19/18 0429  HGB 9.7*   Recent Labs    07/19/18 0429  WBC 12.6*  RBC 3.32*  HCT 31.2*  PLT 233  No results for input(s): NA, K, CL, CO2, BUN, CREATININE, GLUCOSE, CALCIUM in the last 72 hours.  Assessment and Plan: 1 day s/p R TKA ABLA-9.7, expected PO, asymptomatic Up with PT. CPM Dc home when cleared by PT  VTE proph: asa, scds

## 2018-07-19 NOTE — Evaluation (Signed)
Physical Therapy Evaluation Patient Details Name: Connie Garrett MRN: [id number] DOB: [birth year] Today's Date: 07/19/2018   History of Present Illness  58 yo female s/p R TKA 7/17  Clinical Impression  On eval, pt was Min guard assist for mobility. She walked ~60 feet with a RW. Distance was limited by pain-moderately rated pain with activity. Discussed d/c plan-pt plans to return home with HHPT f/u. She would like to be able to go today. Will return for a 2nd session to continue working towards meeting PT goals.     Follow Up Recommendations Follow surgeon's recommendation for DC plan and follow-up therapies    Equipment Recommendations  None recommended by PT    Recommendations for Other Services       Precautions / Restrictions Precautions Precautions: Fall;Knee Restrictions Weight Bearing Restrictions: No Other Position/Activity Restrictions: WBAT      Mobility  Bed Mobility Overal bed mobility: Needs Assistance Bed Mobility: Supine to Sit     Supine to sit: Min guard;HOB elevated     General bed mobility comments: Increased time. Close guard for safety.  Transfers Overall transfer level: Needs assistance Equipment used: Rolling walker (2 wheeled) Transfers: Sit to/from Stand Sit to Stand: Min guard         General transfer comment: Close guard for safety. VCs safety, technique, hand/LE placement.  Ambulation/Gait Ambulation/Gait assistance: Min guard Gait Distance (Feet): 60 Feet Assistive device: Rolling walker (2 wheeled) Gait Pattern/deviations: Step-to pattern;Step-through pattern;Decreased stride length     General Gait Details: Close guard for safety. Slow gait speed. Distance limited by pain.  Stairs            Wheelchair Mobility    Modified Rankin (Stroke Patients Only)       Balance Overall balance assessment: Needs assistance         Standing balance support: Bilateral upper extremity supported Standing balance-Leahy  Scale: Poor                               Pertinent Vitals/Pain Pain Assessment: 0-10 Pain Score: 8  Pain Location: R knee Pain Descriptors / Indicators: Aching;Sore Pain Intervention(s): Monitored during session;Ice applied;Limited activity within patient's tolerance    Home Living Family/patient expects to be discharged to:: Private residence Living Arrangements: Children;Non-relatives/Friends Available Help at Discharge: Family Type of Home: Apartment Home Access: Stairs to enter Entrance Stairs-Rails: Right;Left;Can reach both Entrance Stairs-Number of Steps: 4 Home Layout: One level Home Equipment: Environmental consultant - 2 wheels;Bedside commode      Prior Function Level of Independence: Independent               Hand Dominance        Extremity/Trunk Assessment   Upper Extremity Assessment Upper Extremity Assessment: Overall WFL for tasks assessed    Lower Extremity Assessment Lower Extremity Assessment: (post op weakness 2* TKA)    Cervical / Trunk Assessment Cervical / Trunk Assessment: Normal  Communication   Communication: No difficulties  Cognition Arousal/Alertness: Awake/alert Behavior During Therapy: WFL for tasks assessed/performed;Flat affect Overall Cognitive Status: Within Functional Limits for tasks assessed                                        General Comments      Exercises Total Joint Exercises Ankle Circles/Pumps: AROM;Both;10 reps;Supine Quad Sets: AROM;Both;10 reps;Supine Hip ABduction/ADduction: AROM;AAROM;Right;10 reps;Supine  Straight Leg Raises: AROM;AAROM;Right;10 reps;Supine Knee Flexion: AAROM;Right;10 reps;Seated Goniometric ROM: ~10-70 degrees   Assessment/Plan    PT Assessment Patient needs continued PT services  PT Problem List Decreased strength;Decreased range of motion;Decreased mobility;Decreased balance;Decreased knowledge of use of DME;Decreased activity tolerance;Pain       PT Treatment  Interventions DME instruction;Gait training;Therapeutic activities;Therapeutic exercise;Patient/family education;Balance training;Stair training;Functional mobility training    PT Goals (Current goals can be found in the Care Plan section)  Acute Rehab PT Goals Patient Stated Goal: home PT Goal Formulation: With patient Time For Goal Achievement: 08/02/18 Potential to Achieve Goals: Good    Frequency 7X/week   Barriers to discharge        Co-evaluation               AM-PAC PT "6 Clicks" Mobility  Outcome Measure Help needed turning from your back to your side while in a flat bed without using bedrails?: A Little Help needed moving from lying on your back to sitting on the side of a flat bed without using bedrails?: A Little Help needed moving to and from a bed to a chair (including a wheelchair)?: A Little Help needed standing up from a chair using your arms (e.g., wheelchair or bedside chair)?: A Little Help needed to walk in hospital room?: A Little Help needed climbing 3-5 steps with a railing? : A Little 6 Click Score: 18    End of Session Equipment Utilized During Treatment: Gait belt Activity Tolerance: Patient tolerated treatment well Patient left: in chair;with call bell/phone within reach   PT Visit Diagnosis: Unsteadiness on feet (R26.81);Pain;Other abnormalities of gait and mobility (R26.89) Pain - Right/Left: Right Pain - part of body: Knee    Time: 4696-2952 PT Time Calculation (min) (ACUTE ONLY): 23 min   Charges:   PT Evaluation $PT Eval Low Complexity: 1 Low PT Treatments $Gait Training: 8-22 mins         Weston [name], PT Acute Rehabilitation Services Pager: [phone] Office: [phone]

## 2018-07-19 NOTE — TOC Initial Note (Signed)
Transition of Care [facility]) - Initial/Assessment Note    Patient Details  Name: Connie Garrett MRN: [id number] Date of Birth: 1960-09-09  Transition of Care [facility]) CM/SW Contact:    Joaquin Courts, RN Phone Number: 07/19/2018, 11:38 AM  Clinical Narrative:       CM spoke with patient at bedside. [facility] with [facility] for HHPT. Patient reports she has rolling walker and 3-in-1 at home.             Expected Discharge Plan: [place] Barriers to Discharge: No Barriers Identified   Patient Goals and CMS Choice Patient states their goals for this hospitalization and ongoing recovery are:: to go home CMS Medicare.gov Compare Post Acute Care list provided to:: Patient Choice offered to / list presented to : Patient  Expected Discharge Plan and Services Expected Discharge Plan: [place]   Discharge Planning Services: CM Consult   Living arrangements for the past 2 months: Single Family Home Expected Discharge Date: 07/19/18               DME Arranged: N/A DME Agency: NA       HH Arranged: PT [place] Agency: [place] ([place]) Date [place]: 07/19/18 Time [place]: 1138 Representative spoke with at [place]: Corene Cornea  Prior Living Arrangements/Services Living arrangements for the past 2 months: [place] Lives with:: Adult Children Patient language and need for interpreter reviewed:: Yes Do you feel safe going back to the place where you live?: Yes      Need for Family Participation in Patient Care: Yes (Comment) Care giver support system in place?: Yes (comment)   Criminal Activity/Legal Involvement Pertinent to Current Situation/Hospitalization: No - Comment as needed  Activities of Daily Living Home Assistive Devices/Equipment: Eyeglasses, Bedside commode/3-in-1, Dentures (specify type) ADL Screening (condition at time of admission) Patient's cognitive ability  adequate to safely complete daily activities?: Yes Is the patient deaf or have difficulty hearing?: No Does the patient have difficulty seeing, even when wearing glasses/contacts?: No Does the patient have difficulty concentrating, remembering, or making decisions?: No Patient able to express need for assistance with ADLs?: Yes Does the patient have difficulty dressing or bathing?: No Independently performs ADLs?: Yes (appropriate for developmental age) Does the patient have difficulty walking or climbing stairs?: Yes Weakness of Legs: None Weakness of Arms/Hands: None  Permission Sought/Granted                  Emotional Assessment Appearance:: Appears stated age Attitude/Demeanor/Rapport: Engaged Affect (typically observed): Accepting Orientation: : Oriented to Place, Oriented to  Time, Oriented to Situation, Oriented to Self   Psych Involvement: No (comment)  Admission diagnosis:  RIGHT KNEE OSTEOARTHRITIS Patient Active Problem List   Diagnosis Date Noted  . Primary osteoarthritis of right knee 07/18/2018   PCP:  Marco Collie, MD Pharmacy:   [facility] [place] [address] Phone: [phone] Fax: [phone]     Social Determinants of Health (SDOH) Interventions    Readmission Risk Interventions No flowsheet data found.

## 2018-07-19 NOTE — Progress Notes (Signed)
    Home health agencies that serve [phone].        [place] Quality of Patient Care Rating Patient Survey Summary Rating  [facility] [phone] 3 out of 5 stars 4 out of [place] [phone] 4 out of 5 stars 4 out of [place] [phone] 4 out of 5 stars 4 out of 5 stars  [place] [phone] 4 out of 5 stars 4 out of [place] [phone] 4 out of 5 stars 4 out of [place] [phone] 4 out of 5 stars 4 out of [place] [phone] 3 out of 5 stars 4 out of 5 stars  [facility] [phone] 4 out of 5 stars Not [place] [phone] 3 out of 5 stars 4 out of 5 stars  [facility] [phone] 3  out of 5 stars 3 out of [place] [phone] 4  out of 5 stars 3 out of [place] [phone] 4 out of 5 stars 5 out of [place] [phone] 4  out of 5 stars 3 out of [place] number Footnote as displayed on [place]  1 This agency provides services under a federal waiver program to non-traditional, chronic long term population.  2 This agency provides services to a special needs population.  3 Not Available.  4 The number of patient episodes for this measure is too small to report.  5 This measure currently does not have data or provider has been certified/recertified for less than 6 months.  6 The national average for this measure is not provided because of state-to-state differences in data collection.  7 Medicare is not displaying rates for this measure for any home health agency, because of an issue with the data.  8 There were problems  with the data and they are being corrected.  9 Zero, or very few, patients met the survey's rules for inclusion. The scores shown, if any, reflect a very small number of surveys and may not accurately tell how an agency is doing.  10 Survey results are based on less than 12 months of data.  11 Fewer than 70 patients completed the survey. Use the scores shown, if any, with caution as the number of surveys may be too low to accurately tell how an agency is doing.  12 No survey results are available for this period.  13 Data suppressed by CMS for one or more quarters.

## 2018-07-19 NOTE — Progress Notes (Signed)
Physical Therapy Treatment Patient Details Name: Connie Garrett MRN: [id number] DOB: 08-24-1960 Today's Date: 07/19/2018    History of Present Illness 58 yo female s/p R TKA 7/17    PT Comments    Progressing with mobility. Reviewed/practiced gait and stair training. All education completed. Okay to d/c from PT standpoint.    Follow Up Recommendations  Follow surgeon's recommendation for DC plan and follow-up therapies(HHPT)     Equipment Recommendations  None recommended by PT    Recommendations for Other Services       Precautions / Restrictions Precautions Precautions: Fall;Knee Restrictions Weight Bearing Restrictions: No Other Position/Activity Restrictions: WBAT    Mobility  Bed Mobility               General bed mobility comments: oob in recliner  Transfers Overall transfer level: Needs assistance Equipment used: Rolling walker (2 wheeled) Transfers: Sit to/from Stand Sit to Stand: Min guard         General transfer comment: Close guard for safety. VCs safety, technique, hand/LE placement.  Ambulation/Gait Ambulation/Gait assistance: Min guard Gait Distance (Feet): 110 Feet Assistive device: Rolling walker (2 wheeled) Gait Pattern/deviations: Step-through pattern;Decreased stride length     General Gait Details: Close guard for safety. Slow gait speed.   Stairs Stairs: Yes Stairs assistance: Min guard Stair Management: Forwards;Two rails;Step to pattern Number of Stairs: 2 General stair comments: up and over portable steps. VCs safety, technique, sequence.   Wheelchair Mobility    Modified Rankin (Stroke Patients Only)       Balance                                            Cognition Arousal/Alertness: Awake/alert Behavior During Therapy: WFL for tasks assessed/performed Overall Cognitive Status: Within Functional Limits for tasks assessed                                        Exercises       General Comments        Pertinent Vitals/Pain Pain Assessment: 0-10 Pain Score: 7  Pain Location: R knee Pain Descriptors / Indicators: Aching;Sore Pain Intervention(s): Monitored during session    Home Living                      Prior Function            PT Goals (current goals can now be found in the care plan section) Progress towards PT goals: Progressing toward goals    Frequency    7X/week      PT Plan Current plan remains appropriate    Co-evaluation              AM-PAC PT "6 Clicks" Mobility   Outcome Measure  Help needed turning from your back to your side while in a flat bed without using bedrails?: A Little Help needed moving from lying on your back to sitting on the side of a flat bed without using bedrails?: A Little Help needed moving to and from a bed to a chair (including a wheelchair)?: A Little Help needed standing up from a chair using your arms (e.g., wheelchair or bedside chair)?: A Little Help needed to walk in hospital room?: A Little Help needed climbing 3-5 steps with  a railing? : A Little 6 Click Score: 18    End of Session Equipment Utilized During Treatment: Gait belt Activity Tolerance: Patient tolerated treatment well Patient left: in chair;with call bell/phone within reach   PT Visit Diagnosis: Unsteadiness on feet (R26.81);Pain;Other abnormalities of gait and mobility (R26.89) Pain - Right/Left: Right Pain - part of body: Knee     Time: 0813-8871 PT Time Calculation (min) (ACUTE ONLY): 15 min  Charges:  $Gait Training: 8-22 mins                        [place]on [name], [place] Pager: [phone] Office: [phone]

## 2018-07-19 NOTE — Discharge Summary (Signed)
Patient ID: Connie Garrett MRN: [id number] DOB/AGE: 1960-09-08 58 y.o.  Admit date: 07/18/2018 Discharge date: 07/19/2018  Admission Diagnoses:  Principal Problem:   Primary osteoarthritis of right knee   Discharge Diagnoses:  Same  Past Medical History:  Diagnosis Date  . Arthritis   . Depression   . Hypertension     Surgeries: Procedure(s): TOTAL KNEE ARTHROPLASTY on 07/18/2018   Consultants:   Discharged Condition: Improved  Hospital Course: Connie Garrett is an 58 y.o. female who was admitted 07/18/2018 for operative treatment ofPrimary osteoarthritis of right knee. Patient has severe unremitting pain that affects sleep, daily activities, and work/hobbies. After pre-op clearance the patient was taken to the operating room on 07/18/2018 and underwent  Procedure(s): TOTAL KNEE ARTHROPLASTY.    Patient was given perioperative antibiotics:  Anti-infectives (From admission, onward)   Start     Dose/Rate Route Frequency Ordered Stop   07/18/18 1830  ceFAZolin (ANCEF) IVPB 2g/100 mL premix     2 g 200 mL/hr over 30 Minutes Intravenous Every 6 hours 07/18/18 1634 07/19/18 0248   07/18/18 1030  ceFAZolin (ANCEF) IVPB 2g/100 mL premix     2 g 200 mL/hr over 30 Minutes Intravenous On call to O.R. 07/18/18 1016 07/18/18 1301       Patient was given sequential compression devices, early ambulation, and chemoprophylaxis to prevent DVT.  Patient benefited maximally from hospital stay and there were no complications.    Recent vital signs:  Patient Vitals for the past 24 hrs:  BP Temp Temp src Pulse Resp SpO2 Height Weight  07/19/18 0548 139/68 98.2 F (36.8 C) Oral 80 16 97 % - -  07/19/18 0108 (!) 91/54 98.3 F (36.8 C) Oral 78 20 96 % - -  07/18/18 2241 (!) 94/49 98.5 F (36.9 C) Oral 84 20 97 % - -  07/18/18 1940 (!) 102/49 98.3 F (36.8 C) Oral 90 20 97 % - -  07/18/18 1837 (!) 90/52 98.2 F (36.8 C) Oral 85 16 100 % - -  07/18/18 1755 (!) 95/52 98.9 F (37.2 C) Oral  86 16 98 % - -  07/18/18 1641 - - - - - - 5\' 5"  (1.651 m) 84.6 kg  07/18/18 1640 (!) 111/58 98.1 F (36.7 C) Oral 80 14 100 % - -  07/18/18 1615 (!) 97/57 97.7 F (36.5 C) - 82 13 100 % - -  07/18/18 1600 (!) 96/54 - - 77 14 99 % - -  07/18/18 1545 96/61 - - 77 16 100 % - -  07/18/18 1530 (!) 97/57 - - 83 12 99 % - -  07/18/18 1515 (!) 101/57 - - 81 13 99 % - -  07/18/18 1500 (!) 100/44 - - 86 12 97 % - -  07/18/18 1445 105/64 - - 85 16 100 % - -  07/18/18 1435 97/70 (!) 97.5 F (36.4 C) - 87 14 100 % - -  07/18/18 1216 - - - 81 13 99 % - -  07/18/18 1214 - - - 84 12 99 % - -  07/18/18 1212 - - - 80 13 99 % - -  07/18/18 1210 - - - 81 10 97 % - -  07/18/18 1208 120/60 - - 86 13 97 % - -  07/18/18 1206 - - - 79 14 99 % - -  07/18/18 1204 - - - 82 14 94 % - -  07/18/18 1202 - - - 79 15 98 % - -  07/18/18 1200 - - - 86 16 100 % - -  07/18/18 1158 - - - 83 14 100 % - -  07/18/18 1034 (!) 148/77 98.8 F (37.1 C) Oral 91 16 99 % - -     Recent laboratory studies:  Recent Labs    07/19/18 0429  WBC 12.6*  HGB 9.7*  HCT 31.2*  PLT 233     Discharge Medications:   Allergies as of 07/19/2018   No Known Allergies     Medication List    STOP taking these medications   acetaminophen 325 MG tablet Commonly known as: TYLENOL   naproxen 500 MG tablet Commonly known as: NAPROSYN   topiramate 25 MG tablet Commonly known as: TOPAMAX     TAKE these medications   aspirin EC 325 MG tablet Take 1 tablet (325 mg total) by mouth 2 (two) times daily after a meal. Take x 1 month post op to decrease risk of blood clots.   buPROPion 100 MG tablet Commonly known as: WELLBUTRIN Take 100 mg by mouth 2 (two) times daily.   celecoxib 200 MG capsule Commonly known as: CELEBREX Take 200 mg by mouth daily with breakfast.   desvenlafaxine 50 MG 24 hr tablet Commonly known as: PRISTIQ Take 100 mg by mouth daily.   docusate sodium 100 MG capsule Commonly known as: Colace Take 1  capsule (100 mg total) by mouth 2 (two) times daily.   escitalopram 20 MG tablet Commonly known as: LEXAPRO Take 20 mg by mouth daily.   metoprolol succinate 25 MG 24 hr tablet Commonly known as: TOPROL-XL Take 25 mg by mouth daily.   multivitamin with minerals Tabs tablet Take 1 tablet by mouth daily.   omeprazole 20 MG capsule Commonly known as: PRILOSEC Take 20 mg by mouth daily.   oxyCODONE-acetaminophen 5-325 MG tablet Commonly known as: PERCOCET/ROXICET Take 1-2 tablets by mouth every 6 (six) hours as needed for severe pain.   tiZANidine 2 MG tablet Commonly known as: ZANAFLEX Take 1 tablet (2 mg total) by mouth every 8 (eight) hours as needed for muscle spasms.       Diagnostic Studies: Dg Chest 2 View  Result Date: 07/15/2018 CLINICAL DATA:  Preoperative evaluation for upcoming knee replacement EXAM: CHEST - 2 VIEW COMPARISON:  02/10/2018 FINDINGS: The heart size and mediastinal contours are within normal limits. Both lungs are clear. The visualized skeletal structures show degenerative changes of the thoracic spine. IMPRESSION: No active cardiopulmonary disease. Electronically Signed   By: Inez Catalina M.D.   On: 07/15/2018 16:02    Disposition: Discharge disposition: 01-Home or Self Care       Discharge Instructions    Call MD / Call 911   Complete by: As directed    If you experience chest pain or shortness of breath, CALL 911 and be transported to the hospital emergency room.  If you develope a fever above 101 F, pus (white drainage) or increased drainage or redness at the wound, or calf pain, call your surgeon's office.   Constipation Prevention   Complete by: As directed    Drink plenty of fluids.  Prune juice may be helpful.  You may use a stool softener, such as Colace (over the counter) 100 mg twice a day.  Use MiraLax (over the counter) for constipation as needed.   Diet - low sodium heart healthy   Complete by: As directed    Increase activity  slowly as tolerated   Complete by: As directed  Follow-up Information    Dorna Leitz, MD. Schedule an appointment as soon as possible for a visit in 2 weeks.   Specialty: Orthopedic Surgery Contact information: [place] [address] [phone]            Signed: Grier Mitts 07/19/2018, 8:54 AM

## 2018-07-21 DIAGNOSIS — Z79891 Long term (current) use of opiate analgesic: Secondary | ICD-10-CM | POA: Diagnosis not present

## 2018-07-21 DIAGNOSIS — I10 Essential (primary) hypertension: Secondary | ICD-10-CM | POA: Diagnosis not present

## 2018-07-21 DIAGNOSIS — Z9181 History of falling: Secondary | ICD-10-CM | POA: Diagnosis not present

## 2018-07-21 DIAGNOSIS — Z7982 Long term (current) use of aspirin: Secondary | ICD-10-CM | POA: Diagnosis not present

## 2018-07-21 DIAGNOSIS — F329 Major depressive disorder, single episode, unspecified: Secondary | ICD-10-CM | POA: Diagnosis not present

## 2018-07-21 DIAGNOSIS — Z471 Aftercare following joint replacement surgery: Secondary | ICD-10-CM | POA: Diagnosis not present

## 2018-07-21 DIAGNOSIS — Z96651 Presence of right artificial knee joint: Secondary | ICD-10-CM | POA: Diagnosis not present

## 2018-07-21 DIAGNOSIS — Z79899 Other long term (current) drug therapy: Secondary | ICD-10-CM | POA: Diagnosis not present

## 2018-07-21 DIAGNOSIS — Z791 Long term (current) use of non-steroidal anti-inflammatories (NSAID): Secondary | ICD-10-CM | POA: Diagnosis not present

## 2018-07-22 ENCOUNTER — Encounter (HOSPITAL_COMMUNITY): Payer: Self-pay | Admitting: Orthopedic Surgery

## 2018-07-23 DIAGNOSIS — Z7982 Long term (current) use of aspirin: Secondary | ICD-10-CM | POA: Diagnosis not present

## 2018-07-23 DIAGNOSIS — Z79899 Other long term (current) drug therapy: Secondary | ICD-10-CM | POA: Diagnosis not present

## 2018-07-23 DIAGNOSIS — Z96651 Presence of right artificial knee joint: Secondary | ICD-10-CM | POA: Diagnosis not present

## 2018-07-23 DIAGNOSIS — F329 Major depressive disorder, single episode, unspecified: Secondary | ICD-10-CM | POA: Diagnosis not present

## 2018-07-23 DIAGNOSIS — Z471 Aftercare following joint replacement surgery: Secondary | ICD-10-CM | POA: Diagnosis not present

## 2018-07-23 DIAGNOSIS — I10 Essential (primary) hypertension: Secondary | ICD-10-CM | POA: Diagnosis not present

## 2018-07-25 DIAGNOSIS — I10 Essential (primary) hypertension: Secondary | ICD-10-CM | POA: Diagnosis not present

## 2018-07-25 DIAGNOSIS — F329 Major depressive disorder, single episode, unspecified: Secondary | ICD-10-CM | POA: Diagnosis not present

## 2018-07-25 DIAGNOSIS — Z96651 Presence of right artificial knee joint: Secondary | ICD-10-CM | POA: Diagnosis not present

## 2018-07-25 DIAGNOSIS — Z79899 Other long term (current) drug therapy: Secondary | ICD-10-CM | POA: Diagnosis not present

## 2018-07-25 DIAGNOSIS — Z7982 Long term (current) use of aspirin: Secondary | ICD-10-CM | POA: Diagnosis not present

## 2018-07-25 DIAGNOSIS — Z471 Aftercare following joint replacement surgery: Secondary | ICD-10-CM | POA: Diagnosis not present

## 2018-07-28 DIAGNOSIS — Z471 Aftercare following joint replacement surgery: Secondary | ICD-10-CM | POA: Diagnosis not present

## 2018-07-28 DIAGNOSIS — M1711 Unilateral primary osteoarthritis, right knee: Secondary | ICD-10-CM | POA: Diagnosis not present

## 2018-07-30 DIAGNOSIS — Z96651 Presence of right artificial knee joint: Secondary | ICD-10-CM | POA: Diagnosis not present

## 2018-07-30 DIAGNOSIS — I10 Essential (primary) hypertension: Secondary | ICD-10-CM | POA: Diagnosis not present

## 2018-07-30 DIAGNOSIS — F329 Major depressive disorder, single episode, unspecified: Secondary | ICD-10-CM | POA: Diagnosis not present

## 2018-07-30 DIAGNOSIS — Z7982 Long term (current) use of aspirin: Secondary | ICD-10-CM | POA: Diagnosis not present

## 2018-07-30 DIAGNOSIS — Z79899 Other long term (current) drug therapy: Secondary | ICD-10-CM | POA: Diagnosis not present

## 2018-07-30 DIAGNOSIS — Z471 Aftercare following joint replacement surgery: Secondary | ICD-10-CM | POA: Diagnosis not present

## 2018-07-31 DIAGNOSIS — Z96651 Presence of right artificial knee joint: Secondary | ICD-10-CM | POA: Diagnosis not present

## 2018-07-31 DIAGNOSIS — Z79899 Other long term (current) drug therapy: Secondary | ICD-10-CM | POA: Diagnosis not present

## 2018-07-31 DIAGNOSIS — Z471 Aftercare following joint replacement surgery: Secondary | ICD-10-CM | POA: Diagnosis not present

## 2018-07-31 DIAGNOSIS — I10 Essential (primary) hypertension: Secondary | ICD-10-CM | POA: Diagnosis not present

## 2018-07-31 DIAGNOSIS — F329 Major depressive disorder, single episode, unspecified: Secondary | ICD-10-CM | POA: Diagnosis not present

## 2018-07-31 DIAGNOSIS — Z7982 Long term (current) use of aspirin: Secondary | ICD-10-CM | POA: Diagnosis not present

## 2018-08-01 DIAGNOSIS — Z79899 Other long term (current) drug therapy: Secondary | ICD-10-CM | POA: Diagnosis not present

## 2018-08-01 DIAGNOSIS — Z471 Aftercare following joint replacement surgery: Secondary | ICD-10-CM | POA: Diagnosis not present

## 2018-08-01 DIAGNOSIS — Z96651 Presence of right artificial knee joint: Secondary | ICD-10-CM | POA: Diagnosis not present

## 2018-08-01 DIAGNOSIS — I10 Essential (primary) hypertension: Secondary | ICD-10-CM | POA: Diagnosis not present

## 2018-08-01 DIAGNOSIS — Z7982 Long term (current) use of aspirin: Secondary | ICD-10-CM | POA: Diagnosis not present

## 2018-08-01 DIAGNOSIS — F329 Major depressive disorder, single episode, unspecified: Secondary | ICD-10-CM | POA: Diagnosis not present

## 2018-08-05 DIAGNOSIS — M6281 Muscle weakness (generalized): Secondary | ICD-10-CM | POA: Diagnosis not present

## 2018-08-05 DIAGNOSIS — M25661 Stiffness of right knee, not elsewhere classified: Secondary | ICD-10-CM | POA: Diagnosis not present

## 2018-08-05 DIAGNOSIS — M25561 Pain in right knee: Secondary | ICD-10-CM | POA: Diagnosis not present

## 2018-08-08 DIAGNOSIS — M6281 Muscle weakness (generalized): Secondary | ICD-10-CM | POA: Diagnosis not present

## 2018-08-08 DIAGNOSIS — M25561 Pain in right knee: Secondary | ICD-10-CM | POA: Diagnosis not present

## 2018-08-08 DIAGNOSIS — M25661 Stiffness of right knee, not elsewhere classified: Secondary | ICD-10-CM | POA: Diagnosis not present

## 2018-08-12 DIAGNOSIS — M25661 Stiffness of right knee, not elsewhere classified: Secondary | ICD-10-CM | POA: Diagnosis not present

## 2018-08-12 DIAGNOSIS — M25561 Pain in right knee: Secondary | ICD-10-CM | POA: Diagnosis not present

## 2018-08-12 DIAGNOSIS — M6281 Muscle weakness (generalized): Secondary | ICD-10-CM | POA: Diagnosis not present

## 2018-08-14 DIAGNOSIS — M6281 Muscle weakness (generalized): Secondary | ICD-10-CM | POA: Diagnosis not present

## 2018-08-14 DIAGNOSIS — M25561 Pain in right knee: Secondary | ICD-10-CM | POA: Diagnosis not present

## 2018-08-14 DIAGNOSIS — M25661 Stiffness of right knee, not elsewhere classified: Secondary | ICD-10-CM | POA: Diagnosis not present

## 2018-08-15 DIAGNOSIS — K1120 Sialoadenitis, unspecified: Secondary | ICD-10-CM | POA: Diagnosis not present

## 2018-08-18 DIAGNOSIS — Z9889 Other specified postprocedural states: Secondary | ICD-10-CM | POA: Diagnosis not present

## 2018-08-19 DIAGNOSIS — M6281 Muscle weakness (generalized): Secondary | ICD-10-CM | POA: Diagnosis not present

## 2018-08-19 DIAGNOSIS — M25561 Pain in right knee: Secondary | ICD-10-CM | POA: Diagnosis not present

## 2018-08-19 DIAGNOSIS — M25661 Stiffness of right knee, not elsewhere classified: Secondary | ICD-10-CM | POA: Diagnosis not present

## 2018-08-21 DIAGNOSIS — M6281 Muscle weakness (generalized): Secondary | ICD-10-CM | POA: Diagnosis not present

## 2018-08-21 DIAGNOSIS — M25561 Pain in right knee: Secondary | ICD-10-CM | POA: Diagnosis not present

## 2018-08-21 DIAGNOSIS — M25661 Stiffness of right knee, not elsewhere classified: Secondary | ICD-10-CM | POA: Diagnosis not present

## 2018-08-26 DIAGNOSIS — Z01419 Encounter for gynecological examination (general) (routine) without abnormal findings: Secondary | ICD-10-CM | POA: Diagnosis not present

## 2018-08-26 DIAGNOSIS — Z1231 Encounter for screening mammogram for malignant neoplasm of breast: Secondary | ICD-10-CM | POA: Diagnosis not present

## 2018-08-26 DIAGNOSIS — Z1211 Encounter for screening for malignant neoplasm of colon: Secondary | ICD-10-CM | POA: Diagnosis not present

## 2018-08-26 DIAGNOSIS — Z96651 Presence of right artificial knee joint: Secondary | ICD-10-CM | POA: Diagnosis not present

## 2018-08-26 DIAGNOSIS — Z0000 Encounter for general adult medical examination without abnormal findings: Secondary | ICD-10-CM | POA: Diagnosis not present

## 2018-08-27 DIAGNOSIS — M25561 Pain in right knee: Secondary | ICD-10-CM | POA: Diagnosis not present

## 2018-08-27 DIAGNOSIS — M6281 Muscle weakness (generalized): Secondary | ICD-10-CM | POA: Diagnosis not present

## 2018-08-27 DIAGNOSIS — M25661 Stiffness of right knee, not elsewhere classified: Secondary | ICD-10-CM | POA: Diagnosis not present

## 2018-08-29 DIAGNOSIS — M25561 Pain in right knee: Secondary | ICD-10-CM | POA: Diagnosis not present

## 2018-08-29 DIAGNOSIS — M6281 Muscle weakness (generalized): Secondary | ICD-10-CM | POA: Diagnosis not present

## 2018-08-29 DIAGNOSIS — M25661 Stiffness of right knee, not elsewhere classified: Secondary | ICD-10-CM | POA: Diagnosis not present

## 2018-09-02 DIAGNOSIS — M6281 Muscle weakness (generalized): Secondary | ICD-10-CM | POA: Diagnosis not present

## 2018-09-02 DIAGNOSIS — M25561 Pain in right knee: Secondary | ICD-10-CM | POA: Diagnosis not present

## 2018-09-02 DIAGNOSIS — M25661 Stiffness of right knee, not elsewhere classified: Secondary | ICD-10-CM | POA: Diagnosis not present

## 2018-09-09 DIAGNOSIS — M6281 Muscle weakness (generalized): Secondary | ICD-10-CM | POA: Diagnosis not present

## 2018-09-09 DIAGNOSIS — M25561 Pain in right knee: Secondary | ICD-10-CM | POA: Diagnosis not present

## 2018-09-09 DIAGNOSIS — M25661 Stiffness of right knee, not elsewhere classified: Secondary | ICD-10-CM | POA: Diagnosis not present

## 2018-09-11 DIAGNOSIS — M25561 Pain in right knee: Secondary | ICD-10-CM | POA: Diagnosis not present

## 2018-09-11 DIAGNOSIS — M6281 Muscle weakness (generalized): Secondary | ICD-10-CM | POA: Diagnosis not present

## 2018-09-11 DIAGNOSIS — M25661 Stiffness of right knee, not elsewhere classified: Secondary | ICD-10-CM | POA: Diagnosis not present

## 2018-10-07 DIAGNOSIS — J329 Chronic sinusitis, unspecified: Secondary | ICD-10-CM | POA: Diagnosis not present

## 2018-10-07 DIAGNOSIS — Z6831 Body mass index (BMI) 31.0-31.9, adult: Secondary | ICD-10-CM | POA: Diagnosis not present

## 2018-10-30 DIAGNOSIS — Z13220 Encounter for screening for lipoid disorders: Secondary | ICD-10-CM | POA: Diagnosis not present

## 2018-10-30 DIAGNOSIS — Z1329 Encounter for screening for other suspected endocrine disorder: Secondary | ICD-10-CM | POA: Diagnosis not present

## 2018-10-30 DIAGNOSIS — K5909 Other constipation: Secondary | ICD-10-CM | POA: Diagnosis not present

## 2018-10-30 DIAGNOSIS — R109 Unspecified abdominal pain: Secondary | ICD-10-CM | POA: Diagnosis not present

## 2018-10-30 DIAGNOSIS — I10 Essential (primary) hypertension: Secondary | ICD-10-CM | POA: Diagnosis not present

## 2018-11-06 DIAGNOSIS — I10 Essential (primary) hypertension: Secondary | ICD-10-CM | POA: Diagnosis not present

## 2018-11-06 DIAGNOSIS — E785 Hyperlipidemia, unspecified: Secondary | ICD-10-CM | POA: Diagnosis not present

## 2018-11-06 DIAGNOSIS — R079 Chest pain, unspecified: Secondary | ICD-10-CM | POA: Diagnosis not present

## 2018-11-06 DIAGNOSIS — K5909 Other constipation: Secondary | ICD-10-CM | POA: Diagnosis not present

## 2018-11-11 DIAGNOSIS — Z09 Encounter for follow-up examination after completed treatment for conditions other than malignant neoplasm: Secondary | ICD-10-CM | POA: Diagnosis not present

## 2018-11-11 DIAGNOSIS — Z96651 Presence of right artificial knee joint: Secondary | ICD-10-CM | POA: Diagnosis not present

## 2018-11-26 ENCOUNTER — Ambulatory Visit: Payer: Medicare Other | Admitting: Orthopaedic Surgery

## 2018-11-30 DIAGNOSIS — I10 Essential (primary) hypertension: Secondary | ICD-10-CM | POA: Diagnosis not present

## 2018-12-03 ENCOUNTER — Other Ambulatory Visit: Payer: Self-pay

## 2018-12-03 ENCOUNTER — Ambulatory Visit (INDEPENDENT_AMBULATORY_CARE_PROVIDER_SITE_OTHER): Payer: Medicare Other | Admitting: Orthopaedic Surgery

## 2018-12-03 ENCOUNTER — Ambulatory Visit: Payer: Self-pay

## 2018-12-03 VITALS — Ht 65.0 in | Wt 185.0 lb

## 2018-12-03 DIAGNOSIS — G8929 Other chronic pain: Secondary | ICD-10-CM | POA: Diagnosis not present

## 2018-12-03 DIAGNOSIS — M1712 Unilateral primary osteoarthritis, left knee: Secondary | ICD-10-CM

## 2018-12-03 DIAGNOSIS — M25562 Pain in left knee: Secondary | ICD-10-CM

## 2018-12-03 NOTE — Progress Notes (Signed)
Office Visit Note   Patient: Connie Garrett           Date of Birth: 05-13-1960           MRN: [id number] Visit Date: 12/03/2018              Requested by: Marco Collie, MD [address] [place],  [place] [address] PCP: Marco Collie, MD   Assessment & Plan: Visit Diagnoses:  1. Chronic pain of left knee   2. Unilateral primary osteoarthritis, left knee     Plan: Given the severity of her left knee arthritis and the detrimental effect it is having on her quality of life, activities daily living and her mobility, she does wish to proceed with a total knee arthroplasty with the left knee sometime after the first of the year.  I showed her knee model and explained in detail what the surgery involves from her interoperative from the postoperative course.  We talked about the risk and benefits of surgery.  Having had this before with the right knee she is fully aware of this and does wish to proceed with surgery.  We had a long and thorough discussion about this.  I did tell her that her orthopedic surgeon who replaced her right knee did an excellent job and I would be happy for her to see him for her left knee but she states again that she would rather have knee replaced her left knee.  We will work on getting this scheduled per her request.  I agree with this being medically necessary at this point given the severity of arthritis and the failure conservative treatment with all modalities for over a year.  Follow-Up Instructions: Return for 2 weeks post-op.   Orders:  Orders Placed This Encounter  Procedures  . XR Knee 1-2 Views Left   No orders of the defined types were placed in this encounter.     Procedures: No procedures performed   Clinical Data: No additional findings.   Subjective: Chief Complaint  Patient presents with  . Left Knee - Pain  Patient is a very pleasant 58 year old female who comes for second opinion as it relates to her left knee.  She  actually had a very successful right total knee arthroplasty done in July this year and has done quite well with that knee.  It was done by one of my colleagues in town.  She said she would just rather see somebody else for her other knee.  She does report good range of motion of her right knee.  Her left knee hurts on a daily basis.  She points to the medial joint line of the patellofemoral joint as a source of her pain with her left knee.  She has had multiple injections of that knee in the past.  She is worked on activity modification as well as quad strengthening exercises and physical therapy.  This is worsened for over a year now.  Again she reports good success with her right total knee arthroplasty.  HPI  Review of Systems She currently denies any headache, chest pain, shortness of breath, fever, chills, nausea, vomiting  Objective: Vital Signs: Ht 5' 5"  (1.651 m)   Wt 185 lb (83.9 kg)   BMI 30.79 kg/m   Physical Exam She is alert and orient x3 and in no acute distress Ortho Exam Examination of her right operative knee shows a well healed midline surgical incision.  There is no significant effusion but  just some mild swelling.  She has full extension and full flexion and that knee is ligamentously stable.  Examination of her left knee does show varus malalignment that is not correctable.  There is no flexion contracture but there is significant patellofemoral grind and crepitation with the knee through flexion and extension.  She has good range of motion of the knee.  There is significant medial joint line tenderness as well. Specialty Comments:  No specialty comments available.  Imaging: Xr Knee 1-2 Views Left  Result Date: 12/03/2018 2 views of the left knee show severe end-stage arthritis.  There is varus malalignment.  The medial joint line is completely lost and gone with severe narrowing.  There are large para-articular osteophytes in the medial and patellofemoral compartments.     PMFS History: Patient Active Problem List   Diagnosis Date Noted  . Unilateral primary osteoarthritis, left knee 12/03/2018  . Primary osteoarthritis of right knee 07/18/2018   Past Medical History:  Diagnosis Date  . Arthritis   . Depression   . Hypertension     No family history on file.  Past Surgical History:  Procedure Laterality Date  . ABDOMINAL HYSTERECTOMY    . BACK SURGERY  1990  . EYE SURGERY    . TOTAL KNEE ARTHROPLASTY Right 07/18/2018   Procedure: TOTAL KNEE ARTHROPLASTY;  Surgeon: Dorna Leitz, MD;  Location: [facility];  Service: Orthopedics;  Laterality: Right;   Social History   Occupational History  . Not on file  Tobacco Use  . Smoking status: Never Smoker  . Smokeless tobacco: Never Used  Substance and Sexual Activity  . Alcohol use: Never    Frequency: Never  . Drug use: Not Currently  . Sexual activity: Not on file

## 2019-01-01 DIAGNOSIS — I10 Essential (primary) hypertension: Secondary | ICD-10-CM | POA: Diagnosis not present

## 2019-01-07 DIAGNOSIS — R519 Headache, unspecified: Secondary | ICD-10-CM | POA: Diagnosis not present

## 2019-01-07 DIAGNOSIS — Z20822 Contact with and (suspected) exposure to covid-19: Secondary | ICD-10-CM | POA: Diagnosis not present

## 2019-01-07 DIAGNOSIS — R197 Diarrhea, unspecified: Secondary | ICD-10-CM | POA: Diagnosis not present

## 2019-01-30 ENCOUNTER — Ambulatory Visit: Admit: 2019-01-30 | Payer: Medicare HMO | Admitting: Orthopaedic Surgery

## 2019-01-30 SURGERY — ARTHROPLASTY, KNEE, TOTAL
Anesthesia: Spinal | Site: Knee | Laterality: Left

## 2019-02-02 ENCOUNTER — Inpatient Hospital Stay: Payer: Medicare Other | Admitting: Orthopaedic Surgery

## 2019-02-23 DIAGNOSIS — R69 Illness, unspecified: Secondary | ICD-10-CM | POA: Diagnosis not present

## 2019-02-27 DIAGNOSIS — H50112 Monocular exotropia, left eye: Secondary | ICD-10-CM | POA: Diagnosis not present

## 2019-02-27 DIAGNOSIS — H25041 Posterior subcapsular polar age-related cataract, right eye: Secondary | ICD-10-CM | POA: Diagnosis not present

## 2019-02-27 DIAGNOSIS — H53022 Refractive amblyopia, left eye: Secondary | ICD-10-CM | POA: Diagnosis not present

## 2019-02-27 DIAGNOSIS — H53032 Strabismic amblyopia, left eye: Secondary | ICD-10-CM | POA: Diagnosis not present

## 2019-02-27 DIAGNOSIS — I10 Essential (primary) hypertension: Secondary | ICD-10-CM | POA: Diagnosis not present

## 2019-02-28 DIAGNOSIS — I10 Essential (primary) hypertension: Secondary | ICD-10-CM | POA: Diagnosis not present

## 2019-03-31 DIAGNOSIS — I10 Essential (primary) hypertension: Secondary | ICD-10-CM | POA: Diagnosis not present

## 2019-04-07 DIAGNOSIS — I10 Essential (primary) hypertension: Secondary | ICD-10-CM | POA: Diagnosis not present

## 2019-04-07 DIAGNOSIS — E785 Hyperlipidemia, unspecified: Secondary | ICD-10-CM | POA: Diagnosis not present

## 2019-04-07 DIAGNOSIS — K581 Irritable bowel syndrome with constipation: Secondary | ICD-10-CM | POA: Diagnosis not present

## 2019-04-07 DIAGNOSIS — R69 Illness, unspecified: Secondary | ICD-10-CM | POA: Diagnosis not present

## 2019-04-07 DIAGNOSIS — K219 Gastro-esophageal reflux disease without esophagitis: Secondary | ICD-10-CM | POA: Diagnosis not present

## 2019-04-07 DIAGNOSIS — F419 Anxiety disorder, unspecified: Secondary | ICD-10-CM | POA: Diagnosis not present

## 2019-04-07 DIAGNOSIS — G8929 Other chronic pain: Secondary | ICD-10-CM | POA: Diagnosis not present

## 2019-04-07 DIAGNOSIS — Z791 Long term (current) use of non-steroidal anti-inflammatories (NSAID): Secondary | ICD-10-CM | POA: Diagnosis not present

## 2019-04-20 DIAGNOSIS — R69 Illness, unspecified: Secondary | ICD-10-CM | POA: Diagnosis not present

## 2019-04-24 DIAGNOSIS — H25813 Combined forms of age-related cataract, bilateral: Secondary | ICD-10-CM | POA: Diagnosis not present

## 2019-04-30 DIAGNOSIS — I10 Essential (primary) hypertension: Secondary | ICD-10-CM | POA: Diagnosis not present

## 2019-05-05 DIAGNOSIS — H9209 Otalgia, unspecified ear: Secondary | ICD-10-CM | POA: Diagnosis not present

## 2019-05-05 DIAGNOSIS — R300 Dysuria: Secondary | ICD-10-CM | POA: Diagnosis not present

## 2019-05-05 DIAGNOSIS — R82998 Other abnormal findings in urine: Secondary | ICD-10-CM | POA: Diagnosis not present

## 2019-05-05 DIAGNOSIS — Z6833 Body mass index (BMI) 33.0-33.9, adult: Secondary | ICD-10-CM | POA: Diagnosis not present

## 2019-05-14 DIAGNOSIS — Z01818 Encounter for other preprocedural examination: Secondary | ICD-10-CM | POA: Diagnosis not present

## 2019-05-14 DIAGNOSIS — H25811 Combined forms of age-related cataract, right eye: Secondary | ICD-10-CM | POA: Diagnosis not present

## 2019-05-14 DIAGNOSIS — H2512 Age-related nuclear cataract, left eye: Secondary | ICD-10-CM | POA: Diagnosis not present

## 2019-05-14 DIAGNOSIS — H2511 Age-related nuclear cataract, right eye: Secondary | ICD-10-CM | POA: Diagnosis not present

## 2019-05-21 DIAGNOSIS — H25811 Combined forms of age-related cataract, right eye: Secondary | ICD-10-CM | POA: Diagnosis not present

## 2019-05-28 DIAGNOSIS — J0110 Acute frontal sinusitis, unspecified: Secondary | ICD-10-CM | POA: Diagnosis not present

## 2019-05-28 DIAGNOSIS — Z6833 Body mass index (BMI) 33.0-33.9, adult: Secondary | ICD-10-CM | POA: Diagnosis not present

## 2019-05-28 DIAGNOSIS — R69 Illness, unspecified: Secondary | ICD-10-CM | POA: Diagnosis not present

## 2019-06-03 DIAGNOSIS — E785 Hyperlipidemia, unspecified: Secondary | ICD-10-CM | POA: Diagnosis not present

## 2019-06-04 DIAGNOSIS — H25812 Combined forms of age-related cataract, left eye: Secondary | ICD-10-CM | POA: Diagnosis not present

## 2019-06-04 DIAGNOSIS — H2512 Age-related nuclear cataract, left eye: Secondary | ICD-10-CM | POA: Diagnosis not present

## 2019-06-11 DIAGNOSIS — R7301 Impaired fasting glucose: Secondary | ICD-10-CM | POA: Diagnosis not present

## 2019-06-11 DIAGNOSIS — I10 Essential (primary) hypertension: Secondary | ICD-10-CM | POA: Diagnosis not present

## 2019-06-11 DIAGNOSIS — E785 Hyperlipidemia, unspecified: Secondary | ICD-10-CM | POA: Diagnosis not present

## 2019-06-11 DIAGNOSIS — Z6833 Body mass index (BMI) 33.0-33.9, adult: Secondary | ICD-10-CM | POA: Diagnosis not present

## 2019-06-16 DIAGNOSIS — H25812 Combined forms of age-related cataract, left eye: Secondary | ICD-10-CM | POA: Diagnosis not present

## 2019-06-18 DIAGNOSIS — Z6833 Body mass index (BMI) 33.0-33.9, adult: Secondary | ICD-10-CM | POA: Diagnosis not present

## 2019-06-18 DIAGNOSIS — R7303 Prediabetes: Secondary | ICD-10-CM | POA: Diagnosis not present

## 2019-06-18 DIAGNOSIS — Z23 Encounter for immunization: Secondary | ICD-10-CM | POA: Diagnosis not present

## 2019-06-26 DIAGNOSIS — Z961 Presence of intraocular lens: Secondary | ICD-10-CM | POA: Diagnosis not present

## 2019-07-03 DIAGNOSIS — Z1231 Encounter for screening mammogram for malignant neoplasm of breast: Secondary | ICD-10-CM | POA: Diagnosis not present

## 2019-07-20 DIAGNOSIS — Z6833 Body mass index (BMI) 33.0-33.9, adult: Secondary | ICD-10-CM | POA: Diagnosis not present

## 2019-07-20 DIAGNOSIS — E669 Obesity, unspecified: Secondary | ICD-10-CM | POA: Diagnosis not present

## 2019-07-22 DIAGNOSIS — R69 Illness, unspecified: Secondary | ICD-10-CM | POA: Diagnosis not present

## 2019-08-12 DIAGNOSIS — H524 Presbyopia: Secondary | ICD-10-CM | POA: Diagnosis not present

## 2019-08-28 DIAGNOSIS — H524 Presbyopia: Secondary | ICD-10-CM | POA: Diagnosis not present

## 2019-08-28 DIAGNOSIS — H5213 Myopia, bilateral: Secondary | ICD-10-CM | POA: Diagnosis not present

## 2019-09-12 DIAGNOSIS — R112 Nausea with vomiting, unspecified: Secondary | ICD-10-CM | POA: Diagnosis not present

## 2019-09-12 DIAGNOSIS — R1084 Generalized abdominal pain: Secondary | ICD-10-CM | POA: Diagnosis not present

## 2019-09-12 DIAGNOSIS — R252 Cramp and spasm: Secondary | ICD-10-CM | POA: Diagnosis not present

## 2019-09-12 DIAGNOSIS — M79643 Pain in unspecified hand: Secondary | ICD-10-CM | POA: Diagnosis not present

## 2019-09-12 DIAGNOSIS — R109 Unspecified abdominal pain: Secondary | ICD-10-CM | POA: Diagnosis not present

## 2019-09-12 DIAGNOSIS — R519 Headache, unspecified: Secondary | ICD-10-CM | POA: Diagnosis not present

## 2019-09-12 DIAGNOSIS — I700 Atherosclerosis of aorta: Secondary | ICD-10-CM | POA: Diagnosis not present

## 2019-09-12 DIAGNOSIS — K5730 Diverticulosis of large intestine without perforation or abscess without bleeding: Secondary | ICD-10-CM | POA: Diagnosis not present

## 2019-09-12 DIAGNOSIS — R200 Anesthesia of skin: Secondary | ICD-10-CM | POA: Diagnosis not present

## 2019-09-12 DIAGNOSIS — I10 Essential (primary) hypertension: Secondary | ICD-10-CM | POA: Diagnosis not present

## 2019-09-12 DIAGNOSIS — E119 Type 2 diabetes mellitus without complications: Secondary | ICD-10-CM | POA: Diagnosis not present

## 2019-09-12 DIAGNOSIS — R1110 Vomiting, unspecified: Secondary | ICD-10-CM | POA: Diagnosis not present

## 2019-09-12 DIAGNOSIS — Z79899 Other long term (current) drug therapy: Secondary | ICD-10-CM | POA: Diagnosis not present

## 2019-09-12 DIAGNOSIS — R52 Pain, unspecified: Secondary | ICD-10-CM | POA: Diagnosis not present

## 2019-09-12 DIAGNOSIS — K449 Diaphragmatic hernia without obstruction or gangrene: Secondary | ICD-10-CM | POA: Diagnosis not present

## 2019-09-12 DIAGNOSIS — G4489 Other headache syndrome: Secondary | ICD-10-CM | POA: Diagnosis not present

## 2019-09-12 DIAGNOSIS — K219 Gastro-esophageal reflux disease without esophagitis: Secondary | ICD-10-CM | POA: Diagnosis not present

## 2019-09-22 DIAGNOSIS — Z7689 Persons encountering health services in other specified circumstances: Secondary | ICD-10-CM | POA: Diagnosis not present

## 2019-09-22 DIAGNOSIS — R82998 Other abnormal findings in urine: Secondary | ICD-10-CM | POA: Diagnosis not present

## 2019-09-22 DIAGNOSIS — R42 Dizziness and giddiness: Secondary | ICD-10-CM | POA: Diagnosis not present

## 2019-09-22 DIAGNOSIS — G5603 Carpal tunnel syndrome, bilateral upper limbs: Secondary | ICD-10-CM | POA: Diagnosis not present

## 2019-10-14 DIAGNOSIS — R7303 Prediabetes: Secondary | ICD-10-CM | POA: Diagnosis not present

## 2019-10-14 DIAGNOSIS — E785 Hyperlipidemia, unspecified: Secondary | ICD-10-CM | POA: Diagnosis not present

## 2019-10-21 DIAGNOSIS — R69 Illness, unspecified: Secondary | ICD-10-CM | POA: Diagnosis not present

## 2019-10-22 DIAGNOSIS — R69 Illness, unspecified: Secondary | ICD-10-CM | POA: Diagnosis not present

## 2019-10-22 DIAGNOSIS — R5383 Other fatigue: Secondary | ICD-10-CM | POA: Diagnosis not present

## 2019-10-22 DIAGNOSIS — Z23 Encounter for immunization: Secondary | ICD-10-CM | POA: Diagnosis not present

## 2019-10-22 DIAGNOSIS — Z1339 Encounter for screening examination for other mental health and behavioral disorders: Secondary | ICD-10-CM | POA: Diagnosis not present

## 2019-10-22 DIAGNOSIS — Z0000 Encounter for general adult medical examination without abnormal findings: Secondary | ICD-10-CM | POA: Diagnosis not present

## 2019-10-22 DIAGNOSIS — E1169 Type 2 diabetes mellitus with other specified complication: Secondary | ICD-10-CM | POA: Diagnosis not present

## 2019-10-22 DIAGNOSIS — E663 Overweight: Secondary | ICD-10-CM | POA: Diagnosis not present

## 2019-10-22 DIAGNOSIS — Z136 Encounter for screening for cardiovascular disorders: Secondary | ICD-10-CM | POA: Diagnosis not present

## 2019-10-22 DIAGNOSIS — E785 Hyperlipidemia, unspecified: Secondary | ICD-10-CM | POA: Diagnosis not present

## 2019-10-22 DIAGNOSIS — Z139 Encounter for screening, unspecified: Secondary | ICD-10-CM | POA: Diagnosis not present

## 2019-10-22 DIAGNOSIS — Z1211 Encounter for screening for malignant neoplasm of colon: Secondary | ICD-10-CM | POA: Diagnosis not present

## 2019-10-27 DIAGNOSIS — Z6833 Body mass index (BMI) 33.0-33.9, adult: Secondary | ICD-10-CM | POA: Diagnosis not present

## 2019-10-27 DIAGNOSIS — E663 Overweight: Secondary | ICD-10-CM | POA: Diagnosis not present

## 2019-11-02 DIAGNOSIS — R69 Illness, unspecified: Secondary | ICD-10-CM | POA: Diagnosis not present

## 2019-11-02 DIAGNOSIS — E785 Hyperlipidemia, unspecified: Secondary | ICD-10-CM | POA: Diagnosis not present

## 2019-11-02 DIAGNOSIS — E1169 Type 2 diabetes mellitus with other specified complication: Secondary | ICD-10-CM | POA: Diagnosis not present

## 2019-11-03 DIAGNOSIS — Z6833 Body mass index (BMI) 33.0-33.9, adult: Secondary | ICD-10-CM | POA: Diagnosis not present

## 2019-11-03 DIAGNOSIS — E663 Overweight: Secondary | ICD-10-CM | POA: Diagnosis not present

## 2019-11-10 DIAGNOSIS — E663 Overweight: Secondary | ICD-10-CM | POA: Diagnosis not present

## 2019-11-10 DIAGNOSIS — Z6833 Body mass index (BMI) 33.0-33.9, adult: Secondary | ICD-10-CM | POA: Diagnosis not present

## 2019-12-02 DIAGNOSIS — R69 Illness, unspecified: Secondary | ICD-10-CM | POA: Diagnosis not present

## 2019-12-02 DIAGNOSIS — E1169 Type 2 diabetes mellitus with other specified complication: Secondary | ICD-10-CM | POA: Diagnosis not present

## 2019-12-02 DIAGNOSIS — E785 Hyperlipidemia, unspecified: Secondary | ICD-10-CM | POA: Diagnosis not present

## 2019-12-16 DIAGNOSIS — Z6834 Body mass index (BMI) 34.0-34.9, adult: Secondary | ICD-10-CM | POA: Diagnosis not present

## 2019-12-16 DIAGNOSIS — M545 Low back pain, unspecified: Secondary | ICD-10-CM | POA: Diagnosis not present

## 2019-12-16 DIAGNOSIS — R82998 Other abnormal findings in urine: Secondary | ICD-10-CM | POA: Diagnosis not present

## 2020-01-02 DIAGNOSIS — E1169 Type 2 diabetes mellitus with other specified complication: Secondary | ICD-10-CM | POA: Diagnosis not present

## 2020-01-02 DIAGNOSIS — E785 Hyperlipidemia, unspecified: Secondary | ICD-10-CM | POA: Diagnosis not present

## 2020-01-02 DIAGNOSIS — R69 Illness, unspecified: Secondary | ICD-10-CM | POA: Diagnosis not present

## 2020-01-11 DIAGNOSIS — E785 Hyperlipidemia, unspecified: Secondary | ICD-10-CM | POA: Diagnosis not present

## 2020-01-11 DIAGNOSIS — R69 Illness, unspecified: Secondary | ICD-10-CM | POA: Diagnosis not present

## 2020-01-11 DIAGNOSIS — I700 Atherosclerosis of aorta: Secondary | ICD-10-CM | POA: Diagnosis not present

## 2020-01-11 DIAGNOSIS — G8929 Other chronic pain: Secondary | ICD-10-CM | POA: Diagnosis not present

## 2020-01-11 DIAGNOSIS — J309 Allergic rhinitis, unspecified: Secondary | ICD-10-CM | POA: Diagnosis not present

## 2020-01-11 DIAGNOSIS — K219 Gastro-esophageal reflux disease without esophagitis: Secondary | ICD-10-CM | POA: Diagnosis not present

## 2020-01-11 DIAGNOSIS — E119 Type 2 diabetes mellitus without complications: Secondary | ICD-10-CM | POA: Diagnosis not present

## 2020-01-11 DIAGNOSIS — E669 Obesity, unspecified: Secondary | ICD-10-CM | POA: Diagnosis not present

## 2020-01-11 DIAGNOSIS — I10 Essential (primary) hypertension: Secondary | ICD-10-CM | POA: Diagnosis not present

## 2020-01-11 DIAGNOSIS — Z008 Encounter for other general examination: Secondary | ICD-10-CM | POA: Diagnosis not present

## 2020-01-12 DIAGNOSIS — Z20822 Contact with and (suspected) exposure to covid-19: Secondary | ICD-10-CM | POA: Diagnosis not present

## 2020-01-12 DIAGNOSIS — J029 Acute pharyngitis, unspecified: Secondary | ICD-10-CM | POA: Diagnosis not present

## 2020-01-12 DIAGNOSIS — Z1152 Encounter for screening for COVID-19: Secondary | ICD-10-CM | POA: Diagnosis not present

## 2020-01-12 DIAGNOSIS — R059 Cough, unspecified: Secondary | ICD-10-CM | POA: Diagnosis not present

## 2020-01-12 DIAGNOSIS — J020 Streptococcal pharyngitis: Secondary | ICD-10-CM | POA: Diagnosis not present

## 2020-01-13 DIAGNOSIS — R69 Illness, unspecified: Secondary | ICD-10-CM | POA: Diagnosis not present

## 2020-02-02 DIAGNOSIS — E1169 Type 2 diabetes mellitus with other specified complication: Secondary | ICD-10-CM | POA: Diagnosis not present

## 2020-02-02 DIAGNOSIS — E785 Hyperlipidemia, unspecified: Secondary | ICD-10-CM | POA: Diagnosis not present

## 2020-02-02 DIAGNOSIS — R69 Illness, unspecified: Secondary | ICD-10-CM | POA: Diagnosis not present

## 2020-02-10 DIAGNOSIS — R69 Illness, unspecified: Secondary | ICD-10-CM | POA: Diagnosis not present

## 2020-02-16 DIAGNOSIS — R82998 Other abnormal findings in urine: Secondary | ICD-10-CM | POA: Diagnosis not present

## 2020-02-16 DIAGNOSIS — R42 Dizziness and giddiness: Secondary | ICD-10-CM | POA: Diagnosis not present

## 2020-02-16 DIAGNOSIS — R7303 Prediabetes: Secondary | ICD-10-CM | POA: Diagnosis not present

## 2020-02-16 DIAGNOSIS — E785 Hyperlipidemia, unspecified: Secondary | ICD-10-CM | POA: Diagnosis not present

## 2020-02-16 DIAGNOSIS — Z6835 Body mass index (BMI) 35.0-35.9, adult: Secondary | ICD-10-CM | POA: Diagnosis not present

## 2020-02-23 DIAGNOSIS — R69 Illness, unspecified: Secondary | ICD-10-CM | POA: Diagnosis not present

## 2020-02-23 DIAGNOSIS — I10 Essential (primary) hypertension: Secondary | ICD-10-CM | POA: Diagnosis not present

## 2020-02-23 DIAGNOSIS — E785 Hyperlipidemia, unspecified: Secondary | ICD-10-CM | POA: Diagnosis not present

## 2020-02-23 DIAGNOSIS — E1169 Type 2 diabetes mellitus with other specified complication: Secondary | ICD-10-CM | POA: Diagnosis not present

## 2020-03-01 DIAGNOSIS — I10 Essential (primary) hypertension: Secondary | ICD-10-CM | POA: Diagnosis not present

## 2020-03-01 DIAGNOSIS — R69 Illness, unspecified: Secondary | ICD-10-CM | POA: Diagnosis not present

## 2020-03-01 DIAGNOSIS — E785 Hyperlipidemia, unspecified: Secondary | ICD-10-CM | POA: Diagnosis not present

## 2020-03-01 DIAGNOSIS — E1169 Type 2 diabetes mellitus with other specified complication: Secondary | ICD-10-CM | POA: Diagnosis not present

## 2020-03-22 DIAGNOSIS — Z23 Encounter for immunization: Secondary | ICD-10-CM | POA: Diagnosis not present

## 2020-03-22 DIAGNOSIS — K5909 Other constipation: Secondary | ICD-10-CM | POA: Diagnosis not present

## 2020-03-22 DIAGNOSIS — Z6834 Body mass index (BMI) 34.0-34.9, adult: Secondary | ICD-10-CM | POA: Diagnosis not present

## 2020-04-01 DIAGNOSIS — E785 Hyperlipidemia, unspecified: Secondary | ICD-10-CM | POA: Diagnosis not present

## 2020-04-01 DIAGNOSIS — E1169 Type 2 diabetes mellitus with other specified complication: Secondary | ICD-10-CM | POA: Diagnosis not present

## 2020-04-01 DIAGNOSIS — I10 Essential (primary) hypertension: Secondary | ICD-10-CM | POA: Diagnosis not present

## 2020-04-05 DIAGNOSIS — Z1211 Encounter for screening for malignant neoplasm of colon: Secondary | ICD-10-CM | POA: Diagnosis not present

## 2020-04-05 DIAGNOSIS — Z01419 Encounter for gynecological examination (general) (routine) without abnormal findings: Secondary | ICD-10-CM | POA: Diagnosis not present

## 2020-04-05 DIAGNOSIS — Z0000 Encounter for general adult medical examination without abnormal findings: Secondary | ICD-10-CM | POA: Diagnosis not present

## 2020-04-05 DIAGNOSIS — Z6834 Body mass index (BMI) 34.0-34.9, adult: Secondary | ICD-10-CM | POA: Diagnosis not present

## 2020-04-12 DIAGNOSIS — R69 Illness, unspecified: Secondary | ICD-10-CM | POA: Diagnosis not present

## 2020-04-12 DIAGNOSIS — S60922A Unspecified superficial injury of left hand, initial encounter: Secondary | ICD-10-CM | POA: Diagnosis not present

## 2020-04-12 DIAGNOSIS — M79642 Pain in left hand: Secondary | ICD-10-CM | POA: Diagnosis not present

## 2020-04-12 DIAGNOSIS — S62617A Displaced fracture of proximal phalanx of left little finger, initial encounter for closed fracture: Secondary | ICD-10-CM | POA: Diagnosis not present

## 2020-04-13 DIAGNOSIS — M79642 Pain in left hand: Secondary | ICD-10-CM | POA: Diagnosis not present

## 2020-04-19 DIAGNOSIS — S62617D Displaced fracture of proximal phalanx of left little finger, subsequent encounter for fracture with routine healing: Secondary | ICD-10-CM | POA: Diagnosis not present

## 2020-04-20 DIAGNOSIS — K5909 Other constipation: Secondary | ICD-10-CM | POA: Diagnosis not present

## 2020-04-20 DIAGNOSIS — Z6835 Body mass index (BMI) 35.0-35.9, adult: Secondary | ICD-10-CM | POA: Diagnosis not present

## 2020-04-25 DIAGNOSIS — X58XXXA Exposure to other specified factors, initial encounter: Secondary | ICD-10-CM | POA: Diagnosis not present

## 2020-04-25 DIAGNOSIS — G8918 Other acute postprocedural pain: Secondary | ICD-10-CM | POA: Diagnosis not present

## 2020-04-25 DIAGNOSIS — Y999 Unspecified external cause status: Secondary | ICD-10-CM | POA: Diagnosis not present

## 2020-04-25 DIAGNOSIS — S62617A Displaced fracture of proximal phalanx of left little finger, initial encounter for closed fracture: Secondary | ICD-10-CM | POA: Diagnosis not present

## 2020-05-01 DIAGNOSIS — I10 Essential (primary) hypertension: Secondary | ICD-10-CM | POA: Diagnosis not present

## 2020-05-01 DIAGNOSIS — E1169 Type 2 diabetes mellitus with other specified complication: Secondary | ICD-10-CM | POA: Diagnosis not present

## 2020-05-05 DIAGNOSIS — M25642 Stiffness of left hand, not elsewhere classified: Secondary | ICD-10-CM | POA: Diagnosis not present

## 2020-05-05 DIAGNOSIS — S62617D Displaced fracture of proximal phalanx of left little finger, subsequent encounter for fracture with routine healing: Secondary | ICD-10-CM | POA: Diagnosis not present

## 2020-05-10 DIAGNOSIS — M25642 Stiffness of left hand, not elsewhere classified: Secondary | ICD-10-CM | POA: Diagnosis not present

## 2020-05-17 DIAGNOSIS — Z6835 Body mass index (BMI) 35.0-35.9, adult: Secondary | ICD-10-CM | POA: Diagnosis not present

## 2020-05-17 DIAGNOSIS — M25562 Pain in left knee: Secondary | ICD-10-CM | POA: Diagnosis not present

## 2020-05-17 DIAGNOSIS — K5909 Other constipation: Secondary | ICD-10-CM | POA: Diagnosis not present

## 2020-05-17 DIAGNOSIS — M25642 Stiffness of left hand, not elsewhere classified: Secondary | ICD-10-CM | POA: Diagnosis not present

## 2020-05-19 DIAGNOSIS — M25642 Stiffness of left hand, not elsewhere classified: Secondary | ICD-10-CM | POA: Diagnosis not present

## 2020-05-26 DIAGNOSIS — S62617D Displaced fracture of proximal phalanx of left little finger, subsequent encounter for fracture with routine healing: Secondary | ICD-10-CM | POA: Diagnosis not present

## 2020-05-26 DIAGNOSIS — Z4789 Encounter for other orthopedic aftercare: Secondary | ICD-10-CM | POA: Diagnosis not present

## 2020-05-27 DIAGNOSIS — M25642 Stiffness of left hand, not elsewhere classified: Secondary | ICD-10-CM | POA: Diagnosis not present

## 2020-06-01 DIAGNOSIS — I10 Essential (primary) hypertension: Secondary | ICD-10-CM | POA: Diagnosis not present

## 2020-06-01 DIAGNOSIS — E785 Hyperlipidemia, unspecified: Secondary | ICD-10-CM | POA: Diagnosis not present

## 2020-06-01 DIAGNOSIS — E1169 Type 2 diabetes mellitus with other specified complication: Secondary | ICD-10-CM | POA: Diagnosis not present

## 2020-06-14 ENCOUNTER — Encounter: Payer: Medicare HMO | Admitting: Gastroenterology

## 2020-06-14 DIAGNOSIS — R69 Illness, unspecified: Secondary | ICD-10-CM | POA: Diagnosis not present

## 2020-06-23 DIAGNOSIS — R0683 Snoring: Secondary | ICD-10-CM | POA: Diagnosis not present

## 2020-06-23 DIAGNOSIS — R0600 Dyspnea, unspecified: Secondary | ICD-10-CM | POA: Diagnosis not present

## 2020-06-23 DIAGNOSIS — Z1331 Encounter for screening for depression: Secondary | ICD-10-CM | POA: Diagnosis not present

## 2020-06-23 DIAGNOSIS — E785 Hyperlipidemia, unspecified: Secondary | ICD-10-CM | POA: Diagnosis not present

## 2020-06-23 DIAGNOSIS — E1169 Type 2 diabetes mellitus with other specified complication: Secondary | ICD-10-CM | POA: Diagnosis not present

## 2020-06-28 DIAGNOSIS — R0602 Shortness of breath: Secondary | ICD-10-CM | POA: Diagnosis not present

## 2020-06-28 DIAGNOSIS — R0600 Dyspnea, unspecified: Secondary | ICD-10-CM

## 2020-06-29 ENCOUNTER — Other Ambulatory Visit: Payer: Medicare HMO | Admitting: *Deleted

## 2020-06-29 NOTE — Patient Outreach (Signed)
[place] [facility]) Care Management  06/29/2020  Connie Garrett Oct 04, 1960 [id number]  Unsuccessful outreach attempt made to patient. RN Health Coach left HIPAA compliant voicemail message along with her contact information.  Plan: RN Health Coach will call patient within the next several business days and will send patient an unsuccessful letter.  Connie Loron RN, BSN [place] [phone] [name]

## 2020-06-30 ENCOUNTER — Other Ambulatory Visit: Payer: Self-pay | Admitting: *Deleted

## 2020-06-30 NOTE — Patient Outreach (Signed)
[place] [facility]) Care Management  06/30/2020  Artemis Koller June 28, 1960 [id number]  Successful telephone outreach call to patient for screening. HIPAA identifiers obtained. Nurse reviewed [facility] program and services with patient. Patient verbally agrees to participate in the [facility] program and to disease management outreaches. Patient explained that she would not have time to do the initial assessment today and scheduled an appointment on 07/15/20 to complete the assessment.  Plan: RN Health Coach will send Welcome letter, welcome packet and will call patient on 07/15/20 to complete the initial assessment.  Emelia Loron RN, BSN [place] [phone] [email or web address]

## 2020-07-01 DIAGNOSIS — R42 Dizziness and giddiness: Secondary | ICD-10-CM | POA: Diagnosis not present

## 2020-07-01 DIAGNOSIS — I10 Essential (primary) hypertension: Secondary | ICD-10-CM | POA: Diagnosis not present

## 2020-07-01 DIAGNOSIS — R0789 Other chest pain: Secondary | ICD-10-CM | POA: Diagnosis not present

## 2020-07-01 DIAGNOSIS — S20219A Contusion of unspecified front wall of thorax, initial encounter: Secondary | ICD-10-CM | POA: Diagnosis not present

## 2020-07-01 DIAGNOSIS — R072 Precordial pain: Secondary | ICD-10-CM | POA: Diagnosis not present

## 2020-07-01 DIAGNOSIS — R079 Chest pain, unspecified: Secondary | ICD-10-CM | POA: Diagnosis not present

## 2020-07-01 DIAGNOSIS — E785 Hyperlipidemia, unspecified: Secondary | ICD-10-CM | POA: Diagnosis not present

## 2020-07-01 DIAGNOSIS — E1169 Type 2 diabetes mellitus with other specified complication: Secondary | ICD-10-CM | POA: Diagnosis not present

## 2020-07-01 DIAGNOSIS — Z6836 Body mass index (BMI) 36.0-36.9, adult: Secondary | ICD-10-CM | POA: Diagnosis not present

## 2020-07-01 DIAGNOSIS — Z743 Need for continuous supervision: Secondary | ICD-10-CM | POA: Diagnosis not present

## 2020-07-05 DIAGNOSIS — Z9189 Other specified personal risk factors, not elsewhere classified: Secondary | ICD-10-CM | POA: Diagnosis not present

## 2020-07-05 DIAGNOSIS — Z6836 Body mass index (BMI) 36.0-36.9, adult: Secondary | ICD-10-CM | POA: Diagnosis not present

## 2020-07-05 DIAGNOSIS — R0600 Dyspnea, unspecified: Secondary | ICD-10-CM | POA: Diagnosis not present

## 2020-07-15 ENCOUNTER — Encounter: Payer: Self-pay | Admitting: *Deleted

## 2020-07-15 ENCOUNTER — Other Ambulatory Visit: Payer: Self-pay | Admitting: *Deleted

## 2020-07-15 NOTE — Patient Outreach (Signed)
[facility]) Care Management  [place]  07/15/2020   Connie Garrett [birth year] [id number]  Subjective: Successful telephone outreach call to patient. HIPAA identifiers obtained. Patient reports that she does have a B/P monitor and a glucometer but she has not been taking her B/P or blood sugar at home. Nurse provided education and the patient states she will begin to take her B/P and blood sugar 2 times a week and record her values. Nurse will send patient hypertension, diabetes education, and a calendar booklet for her to record her B/P and blood sugar  values. Patient states that she does want to lose as much weight as possible. She reports that she does drink about 6 Pepsi's a day and feels she can decrease this to 4 a day. Patient has already decreased the amount of candy and chips she eats. She states that she will begin to drink 2 flavored waters a day and will do chair exercises 2-3 times a week. Nurse will send patient a planning healthy meals booklet, weight loss tips, and chair exercise printouts. Patient states that her home environment is safe and denies any needs for DME. She explains that her friend Vicente Males is supportive and does assist her with reading documents and education information. Patient did not have any further questions or concerns today and did confirm that she has this nurse's contact number to call her if needed.   Encounter Medications:  Outpatient Encounter Medications as of 07/15/2020  Medication Sig Note   aspirin EC 325 MG tablet Take 1 tablet (325 mg total) by mouth 2 (two) times daily after a meal. Take x 1 month post op to decrease risk of blood clots.    buPROPion (WELLBUTRIN) 100 MG tablet Take 100 mg by mouth 2 (two) times daily.    celecoxib (CELEBREX) 200 MG capsule Take 200 mg by mouth daily with breakfast.    desvenlafaxine (PRISTIQ) 50 MG 24 hr tablet Take 100 mg by mouth daily.    docusate sodium (COLACE) 100 MG capsule Take 1  capsule (100 mg total) by mouth 2 (two) times daily.    escitalopram (LEXAPRO) 20 MG tablet Take 20 mg by mouth daily.    metoprolol succinate (TOPROL-XL) 25 MG 24 hr tablet Take 25 mg by mouth daily. 07/15/2018: Not taking   Multiple Vitamin (MULTIVITAMIN WITH MINERALS) TABS tablet Take 1 tablet by mouth daily.    omeprazole (PRILOSEC) 20 MG capsule Take 20 mg by mouth daily.    tiZANidine (ZANAFLEX) 2 MG tablet Take 1 tablet (2 mg total) by mouth every 8 (eight) hours as needed for muscle spasms.    metoprolol succinate (TOPROL-XL) 25 MG 24 hr tablet Take by mouth. (Patient not taking: Reported on 0/93/8182) 9/93/7169: duplicate   oxyCODONE-acetaminophen (PERCOCET/ROXICET) 5-325 MG tablet Take 1-2 tablets by mouth every 6 (six) hours as needed for severe pain. (Patient not taking: Reported on 07/15/2020) 07/15/2020: completed   No facility-administered encounter medications on file as of 07/15/2020.    Functional Status:  No flowsheet data found.  Fall/Depression Screening: Fall Risk  07/15/2020 07/15/2020  Falls in the past year? 0 0  Number falls in past yr: 0 0  Injury with Fall? 0 0  Risk for fall due to : Impaired balance/gait Impaired mobility  Follow up Falls prevention discussed;Education provided;Falls evaluation completed Falls prevention discussed;Education provided;Falls evaluation completed   PHQ 2/9 Scores 07/15/2020  PHQ - 2 Score 4  PHQ- 9 Score 12    Assessment:  Care Plan Care Plan : Hypertension (Adult)  Updates made by Michiel Cowboy, RN since 07/15/2020 12:00 AM     Problem: Hypertension (Hypertension)   Priority: Medium     Long-Range Goal: Hypertension Monitored   Start Date: 07/15/2020  Expected End Date: 07/30/2021  Note:   Evidence-based guidance:  Promote initial use of ambulatory blood pressure measurements (for 3 days) to rule out "white-coat" effect; identify masked hypertension and presence or absence of nocturnal "dipping" of blood pressure.    Encourage continued use of home blood pressure monitoring and recording in blood pressure log; include symptoms of hypotension or potential medication side effects in log.  Review blood pressure measurements taken inside and outside of the provider office; establish baseline and monitor trends; compare to target ranges or patient goal.  Share overall cardiovascular risk with patient; encourage changes to lifestyle risk factors, including alcohol consumption, smoking, inadequate exercise, poor dietary habits and stress.   Notes:     Task: Identify and Monitor Blood Pressure Elevation   Due Date: 07/30/2021  Note:   Care Management Activities:    - blood pressure trends reviewed - depression screen reviewed - home or ambulatory blood pressure monitoring encouraged    Notes:     Problem: Disease Progression (Hypertension)   Priority: Medium     Long-Range Goal: Disease Progression Prevented or Minimized   Start Date: 07/15/2020  Expected End Date: 07/30/2021  Note:   Evidence-based guidance:  Tailor lifestyle advice to individual; review progress regularly; give frequent encouragement and respond positively to incremental successes.  Assess for and promote awareness of worsening disease or development of comorbidity.  Prepare patient for laboratory and diagnostic exams based on risk and presentation.  Prepare patient for use of pharmacologic therapy that may include diuretic, beta-blocker, beta-blocker/thiazide combination, angiotensin-converting enzyme inhibitor, renin-angiotensin blocker or calcium-channel blocker.  Expect periodic adjustments to pharmacologic therapy; manage side effects.  Promote a healthy diet that includes primarily plant-based foods, such as fruits, vegetables, whole grains, beans and legumes, low-fat dairy and lean meats.   Consider moderate reduction in sodium intake by avoiding the addition of salt to prepared foods and limiting processed meats, canned soup,  frozen meals and salty snacks.   Promote a regular, daily exercise goal of 150 minutes per week of moderate exercise based on tolerance, ability and patient choice; consider referral to physical therapist, community wellness and/or activity program.  Encourage the avoidance of no more than 2 hours per day of sedentary activity, such as recreational screen time.  Review sources of stress; explore current coping strategies and encourage use of mindfulness, yoga, meditation or exercise to manage stress.   Notes:     Task: Alleviate Barriers to Hypertension Treatment   Due Date: 07/30/2021  Note:   Care Management Activities:    - healthy diet promoted - medical nutrition therapy provided - pain assessed and managed - reduction of dietary sodium encouraged - reduction in sedentary activities encouraged    Notes:       Goals Addressed             This Visit's Progress    [facility]) Patient will verbalize losing 2-3 pounds within the next 90 days       Timeframe:  Long-Range Goal Priority:  Medium Start Date:  07/15/20                           Expected End Date:  07/23/21  Follow Up Date: 10/30/20  -Discussed drinking 4 Pepsi's a day instead of 6 -Encouraged continuation of decreasing the amount of candy and chips eaten -Discussed drinking 2 bottles of flavored water a day to stay hydrated -Discussed doing chair exercises 2-3 times a week -Encouraged patient to make healthier food choices  Notes: 07/15/20: Patient states that she does want to lose as much weight as possible. She reports that she does drink about 6 Pepsi's a day and feels she can decrease this to 4 a day. Patient has already decreased the amount of candy and chips she eats. She states that she will begin to drink 2 flavored waters a day and will do chair exercises 2-3 times a week. Nurse will send patient a planning healthy meals booklet,weight loss tips,  and chair exercise printouts.      [facility])  Patient will verbalize monitoring her B/P 2 times a week within the next 90 days       Timeframe:  Long-Range Goal Priority:  Medium Start Date: 07/15/20                            Expected End Date: 07/30/21                      Follow Up Date 10/30/20    - write blood pressure results in a log or diary -Encouraged patient to take her B/P 2 times a week -Discussed nurse will send a calendar booklet for patient to write down her B/P values  -Discussed doing chair exercises 2-3 times a week -Encourage patient to eat a low salt diet   Why is this important?   You won't feel high blood pressure, but it can still hurt your blood vessels.  High blood pressure can cause heart or kidney problems. It can also cause a stroke.  Making lifestyle changes like losing a little weight or eating less salt will help.  Checking your blood pressure at home and at different times of the day can help to control blood pressure.  If the doctor prescribes medicine remember to take it the way the doctor ordered.  Call the office if you cannot afford the medicine or if there are questions about it.     Notes: 07/15/20: Patient reports that she does have a B/P monitor but she has not been taking her B/P at home. Nurse provided education and the patient states she will begin to take her B/P 2 times a week and record her values. Nurse will send patient hypertension education and a calendar booklet for her to write her B/P values.     [facility]) Patient will verbalize monitoring her blood sugar 2 times weekly within the next 90 days       Timeframe:  Long-Range Goal Priority:  Medium Start Date: 07/15/20                            Expected End Date: 07/30/21                     Follow Up Date 10/30/20   -Encouraged patient to take her blood sugar 2 times a week -Discussed writing her values down in calendar booklet -Encouraged patient to limit her sugar and carbohydrate intake  -Discussed doing chair exercises 2-3 times  a week     Why is this important?   Checking your  blood sugar at home helps to keep it from getting very high or very low.  Writing the results in a diary or log helps the doctor know how to care for you.  Your blood sugar log should have the time, date and the results.  Also, write down the amount of insulin or other medicine that you take.  Other information, like what you ate, exercise done and how you were feeling, will also be helpful.     Notes: 07/15/20: Patient reports she does have a blood sugar monitor but currently is not checking her blood sugar at home. Nurse provided education and patient stated will start to take her blood sugar 2 times a week and record the values. Nurse will send patient diabetes education and a calendar booklet for her to record her blood sugar values.       Plan: RN Health Coach will send PCP today's assessment note and a barrier letter, will send patient a calendar booklet, diabetes, hypertension, weight loss education, planning healthy meals booklet, chair exercise printouts, and advance directive documents, and will call patient within the month of October. Follow-up: Patient agrees to Care Plan and Follow-up.  Emelia Loron RN, BSN [place] [phone] [email or web address]

## 2020-07-15 NOTE — Patient Instructions (Addendum)
Goals Addressed             This Visit's Progress    [facility]) Patient will verbalize losing 2-3 pounds within the next 90 days       Timeframe:  Long-Range Goal Priority:  Medium Start Date:  07/15/20                           Expected End Date:  07/23/21                    Follow Up Date: 10/30/20  -Discussed drinking 4 Pepsi's a day instead of 6 -Encouraged continuation of decreasing the amount of candy and chips eaten -Discussed drinking 2 bottles of flavored water a day to stay hydrated -Discussed doing chair exercises 2-3 times a week -Encouraged patient to make healthier food choices  Notes: 07/15/20: Patient states that she does want to lose as much weight as possible. She reports that she does drink about 6 Pepsi's a day and feels she can decrease this to 4 a day. Patient has already decreased the amount of candy and chips she eats. She states that she will begin to drink 2 flavored waters a day and will do chair exercises 2-3 times a week. Nurse will send patient a planning healthy meals booklet,weight loss tips,  and chair exercise printouts.      [facility]) Patient will verbalize monitoring her B/P 2 times a week within the next 90 days       Timeframe:  Long-Range Goal Priority:  Medium Start Date: 07/15/20                            Expected End Date: 07/30/21                      Follow Up Date 10/30/20    - write blood pressure results in a log or diary -Encouraged patient to take her B/P 2 times a week -Discussed nurse will send a calendar booklet for patient to write down her B/P values  -Discussed doing chair exercises 2-3 times a week -Encourage patient to eat a low salt diet   Why is this important?   You won't feel high blood pressure, but it can still hurt your blood vessels.  High blood pressure can cause heart or kidney problems. It can also cause a stroke.  Making lifestyle changes like losing a little weight or eating less salt will help.  Checking your blood  pressure at home and at different times of the day can help to control blood pressure.  If the doctor prescribes medicine remember to take it the way the doctor ordered.  Call the office if you cannot afford the medicine or if there are questions about it.     Notes: 07/15/20: Patient reports that she does have a B/P monitor but she has not been taking her B/P at home. Nurse provided education and the patient states she will begin to take her B/P 2 times a week and record her values. Nurse will send patient hypertension education and a calendar booklet for her to write her B/P values.     [facility]) Patient will verbalize monitoring her blood sugar 2 times weekly within the next 90 days       Timeframe:  Long-Range Goal Priority:  Medium Start Date: 07/15/20  Expected End Date: 07/30/21                     Follow Up Date 10/30/20   -Encouraged patient to take her blood sugar 2 times a week -Discussed writing her values down in calendar booklet -Encouraged patient to limit her sugar and carbohydrate intake  -Discussed doing chair exercises 2-3 times a week     Why is this important?   Checking your blood sugar at home helps to keep it from getting very high or very low.  Writing the results in a diary or log helps the doctor know how to care for you.  Your blood sugar log should have the time, date and the results.  Also, write down the amount of insulin or other medicine that you take.  Other information, like what you ate, exercise done and how you were feeling, will also be helpful.     Notes: 07/15/20: Patient reports she does have a blood sugar monitor but currently is not checking her blood sugar at home. Nurse provided education and patient stated will start to take her blood sugar 2 times a week and record the values. Nurse will send patient diabetes education and a calendar booklet for her to record her blood sugar values.

## 2020-07-18 DIAGNOSIS — E1169 Type 2 diabetes mellitus with other specified complication: Secondary | ICD-10-CM | POA: Diagnosis not present

## 2020-07-19 DIAGNOSIS — R69 Illness, unspecified: Secondary | ICD-10-CM | POA: Diagnosis not present

## 2020-07-27 DIAGNOSIS — I10 Essential (primary) hypertension: Secondary | ICD-10-CM | POA: Diagnosis not present

## 2020-07-27 DIAGNOSIS — R69 Illness, unspecified: Secondary | ICD-10-CM | POA: Diagnosis not present

## 2020-07-27 DIAGNOSIS — E1169 Type 2 diabetes mellitus with other specified complication: Secondary | ICD-10-CM | POA: Diagnosis not present

## 2020-07-27 DIAGNOSIS — E785 Hyperlipidemia, unspecified: Secondary | ICD-10-CM | POA: Diagnosis not present

## 2020-07-28 ENCOUNTER — Encounter: Payer: Self-pay | Admitting: Nurse Practitioner

## 2020-08-01 DIAGNOSIS — E785 Hyperlipidemia, unspecified: Secondary | ICD-10-CM | POA: Diagnosis not present

## 2020-08-01 DIAGNOSIS — E1169 Type 2 diabetes mellitus with other specified complication: Secondary | ICD-10-CM | POA: Diagnosis not present

## 2020-08-09 DIAGNOSIS — H52223 Regular astigmatism, bilateral: Secondary | ICD-10-CM | POA: Diagnosis not present

## 2020-08-10 DIAGNOSIS — Z0100 Encounter for examination of eyes and vision without abnormal findings: Secondary | ICD-10-CM | POA: Diagnosis not present

## 2020-08-24 DIAGNOSIS — M79602 Pain in left arm: Secondary | ICD-10-CM | POA: Diagnosis not present

## 2020-08-24 DIAGNOSIS — W19XXXA Unspecified fall, initial encounter: Secondary | ICD-10-CM | POA: Diagnosis not present

## 2020-08-24 DIAGNOSIS — W010XXA Fall on same level from slipping, tripping and stumbling without subsequent striking against object, initial encounter: Secondary | ICD-10-CM | POA: Diagnosis not present

## 2020-08-24 DIAGNOSIS — S62002A Unspecified fracture of navicular [scaphoid] bone of left wrist, initial encounter for closed fracture: Secondary | ICD-10-CM | POA: Diagnosis not present

## 2020-08-24 DIAGNOSIS — S52572A Other intraarticular fracture of lower end of left radius, initial encounter for closed fracture: Secondary | ICD-10-CM | POA: Diagnosis not present

## 2020-08-24 DIAGNOSIS — Z743 Need for continuous supervision: Secondary | ICD-10-CM | POA: Diagnosis not present

## 2020-08-24 DIAGNOSIS — S62617A Displaced fracture of proximal phalanx of left little finger, initial encounter for closed fracture: Secondary | ICD-10-CM | POA: Diagnosis not present

## 2020-08-24 DIAGNOSIS — S62102A Fracture of unspecified carpal bone, left wrist, initial encounter for closed fracture: Secondary | ICD-10-CM | POA: Diagnosis not present

## 2020-08-24 DIAGNOSIS — S6990XA Unspecified injury of unspecified wrist, hand and finger(s), initial encounter: Secondary | ICD-10-CM | POA: Diagnosis not present

## 2020-08-24 DIAGNOSIS — R5383 Other fatigue: Secondary | ICD-10-CM | POA: Diagnosis not present

## 2020-08-26 ENCOUNTER — Ambulatory Visit: Payer: Medicare HMO | Admitting: Nurse Practitioner

## 2020-08-30 DIAGNOSIS — S52572A Other intraarticular fracture of lower end of left radius, initial encounter for closed fracture: Secondary | ICD-10-CM | POA: Diagnosis not present

## 2020-08-31 DIAGNOSIS — R0902 Hypoxemia: Secondary | ICD-10-CM | POA: Diagnosis not present

## 2020-08-31 DIAGNOSIS — S52572A Other intraarticular fracture of lower end of left radius, initial encounter for closed fracture: Secondary | ICD-10-CM | POA: Diagnosis not present

## 2020-08-31 DIAGNOSIS — I10 Essential (primary) hypertension: Secondary | ICD-10-CM | POA: Diagnosis not present

## 2020-08-31 DIAGNOSIS — W19XXXA Unspecified fall, initial encounter: Secondary | ICD-10-CM | POA: Diagnosis not present

## 2020-08-31 DIAGNOSIS — E119 Type 2 diabetes mellitus without complications: Secondary | ICD-10-CM | POA: Diagnosis not present

## 2020-08-31 DIAGNOSIS — G8918 Other acute postprocedural pain: Secondary | ICD-10-CM | POA: Diagnosis not present

## 2020-09-01 DIAGNOSIS — E785 Hyperlipidemia, unspecified: Secondary | ICD-10-CM | POA: Diagnosis not present

## 2020-09-01 DIAGNOSIS — E1169 Type 2 diabetes mellitus with other specified complication: Secondary | ICD-10-CM | POA: Diagnosis not present

## 2020-09-12 DIAGNOSIS — E668 Other obesity: Secondary | ICD-10-CM | POA: Diagnosis not present

## 2020-09-12 DIAGNOSIS — R5383 Other fatigue: Secondary | ICD-10-CM | POA: Diagnosis not present

## 2020-09-12 DIAGNOSIS — G4733 Obstructive sleep apnea (adult) (pediatric): Secondary | ICD-10-CM | POA: Diagnosis not present

## 2020-09-12 DIAGNOSIS — R400 Somnolence: Secondary | ICD-10-CM | POA: Diagnosis not present

## 2020-09-13 DIAGNOSIS — R69 Illness, unspecified: Secondary | ICD-10-CM | POA: Diagnosis not present

## 2020-09-13 DIAGNOSIS — F3341 Major depressive disorder, recurrent, in partial remission: Secondary | ICD-10-CM | POA: Diagnosis not present

## 2020-09-15 DIAGNOSIS — S52572A Other intraarticular fracture of lower end of left radius, initial encounter for closed fracture: Secondary | ICD-10-CM | POA: Diagnosis not present

## 2020-09-26 ENCOUNTER — Other Ambulatory Visit: Payer: Self-pay

## 2020-09-28 ENCOUNTER — Other Ambulatory Visit: Payer: Self-pay | Admitting: *Deleted

## 2020-09-28 ENCOUNTER — Other Ambulatory Visit (INDEPENDENT_AMBULATORY_CARE_PROVIDER_SITE_OTHER): Payer: Medicare HMO

## 2020-09-28 ENCOUNTER — Ambulatory Visit (INDEPENDENT_AMBULATORY_CARE_PROVIDER_SITE_OTHER): Payer: Medicare HMO | Admitting: Nurse Practitioner

## 2020-09-28 ENCOUNTER — Encounter: Payer: Self-pay | Admitting: Nurse Practitioner

## 2020-09-28 VITALS — BP 140/74 | HR 70 | Ht 65.0 in | Wt 220.0 lb

## 2020-09-28 DIAGNOSIS — R1084 Generalized abdominal pain: Secondary | ICD-10-CM

## 2020-09-28 DIAGNOSIS — K5900 Constipation, unspecified: Secondary | ICD-10-CM

## 2020-09-28 DIAGNOSIS — K219 Gastro-esophageal reflux disease without esophagitis: Secondary | ICD-10-CM

## 2020-09-28 DIAGNOSIS — R1310 Dysphagia, unspecified: Secondary | ICD-10-CM | POA: Diagnosis not present

## 2020-09-28 LAB — COMPREHENSIVE METABOLIC PANEL
ALT: 12 U/L (ref 0–35)
AST: 12 U/L (ref 0–37)
Albumin: 4 g/dL (ref 3.5–5.2)
Alkaline Phosphatase: 111 U/L (ref 39–117)
BUN: 10 mg/dL (ref 6–23)
CO2: 29 mEq/L (ref 19–32)
Calcium: 9.1 mg/dL (ref 8.4–10.5)
Chloride: 100 mEq/L (ref 96–112)
Creatinine, Ser: 0.84 mg/dL (ref 0.40–1.20)
GFR: 76.78 mL/min (ref >60.00)
Glucose, Bld: 99 mg/dL (ref 70–99)
Potassium: 3.6 mEq/L (ref 3.5–5.1)
Sodium: 138 mEq/L (ref 135–145)
Total Bilirubin: 0.7 mg/dL (ref 0.2–1.2)
Total Protein: 7.2 g/dL (ref 6.0–8.3)

## 2020-09-28 LAB — CBC WITH DIFFERENTIAL/PLATELET
Basophils Absolute: 0.1 10*3/uL (ref 0.0–0.1)
Basophils Relative: 0.8 % (ref 0.0–3.0)
Eosinophils Absolute: 0.2 10*3/uL (ref 0.0–0.7)
Eosinophils Relative: 2.6 % (ref 0.0–5.0)
HCT: 37.8 % (ref 36.0–46.0)
Hemoglobin: 12.7 g/dL (ref 12.0–15.0)
Lymphocytes Relative: 27.3 % (ref 12.0–46.0)
Lymphs Abs: 2.4 10*3/uL (ref 0.7–4.0)
MCHC: 33.5 g/dL (ref 30.0–36.0)
MCV: 85.8 fl (ref 78.0–100.0)
Monocytes Absolute: 0.8 10*3/uL (ref 0.1–1.0)
Monocytes Relative: 8.7 % (ref 3.0–12.0)
Neutro Abs: 5.4 10*3/uL (ref 1.4–7.7)
Neutrophils Relative %: 60.6 % (ref 43.0–77.0)
Platelets: 260 10*3/uL (ref 150.0–400.0)
RBC: 4.4 Mil/uL (ref 3.87–5.11)
RDW: 14.2 % (ref 11.5–15.5)
WBC: 9 10*3/uL (ref 4.0–10.5)

## 2020-09-28 LAB — TSH: TSH: 3.04 u[IU]/mL (ref 0.35–5.50)

## 2020-09-28 MED ORDER — SUTAB 1479-225-188 MG PO TABS: 1.0000 | ORAL_TABLET | ORAL | 0 refills | Status: DC

## 2020-09-28 MED ORDER — DICYCLOMINE HCL 10 MG PO CAPS: 10.0000 mg | ORAL_CAPSULE | Freq: Three times a day (TID) | ORAL | 1 refills | Status: DC | PRN

## 2020-09-28 NOTE — Patient Instructions (Addendum)
Goals Addressed             This Visit's Progress    [facility]) Patient will verbalize losing 2-3 pounds within the next 90 days       Timeframe:  Long-Range Goal Priority:  Medium Start Date:  07/15/20                           Expected End Date:  07/23/21                    Follow Up Date: 12/30/20  -Discussed decreasing the amount of Pepsi she drinks daily (currently drinking 3) -Encouraged continuation of decreasing the amount of candy and chips she eats -Discussed drinking 2 bottles of flavored water a day to stay hydrated -Discussed doing chair exercises 2-3 times a week -Encouraged patient to make healthier food choices -Encouraged patient to increase the amount of fruits and vegetables she eats daily  Notes: 09/28/20: Patient reports that she has decreased the amount of Pepsi she drinks to 3 a day. She explains that she has continued to limit the amount of candy and chips she eats and is drinking one bottle of flavor water a day. Patient states she has not lost any weight and has actually gained weight. Nurse praised the patient for making healthier food choices and encouraged her to increase her physical activity by doing chair exercises. Discussed eating more fruits and vegetables and that constipation can cause weight gain. Nurse will send constipation education and patient states that she needs another printout of chair exercises that nurse will send.   07/15/20: Patient states that she does want to lose as much weight as possible. She reports that she does drink about 6 Pepsi's a day and feels she can decrease this to 4 a day. Patient has already decreased the amount of candy and chips she eats. She states that she will begin to drink 2 flavored waters a day and will do chair exercises 2-3 times a week. Nurse will send patient a planning healthy meals booklet,weight loss tips,  and chair exercise printouts.      [facility]) Patient will verbalize monitoring her B/P 2 times a week within the  next 90 days       Timeframe:  Long-Range Goal Priority:  Medium Start Date: 07/15/20                            Expected End Date: 07/30/21                      Follow Up Date 12/30/20    - write blood pressure results in a log or diary -Encouraged patient to take her B/P 2 times a week -Discussed nurse will send a calendar booklet for patient to write down her B/P values  -Discussed doing chair exercises 2-3 times a week -Encourage patient to eat a low salt diet   Why is this important?   You won't feel high blood pressure, but it can still hurt your blood vessels.  High blood pressure can cause heart or kidney problems. It can also cause a stroke.  Making lifestyle changes like losing a little weight or eating less salt will help.  Checking your blood pressure at home and at different times of the day can help to control blood pressure.  If the doctor prescribes medicine remember to take it the  way the doctor ordered.  Call the office if you cannot afford the medicine or if there are questions about it.     Notes: 09/28/20: Patient states she has not started to take her B/P 2 times a week but she will begin soon. She did not receive the calendar booklet and nurse will send her a calendar booklet to record her B/P values and education about how to take your blood pressure.   07/15/20: Patient reports that she does have a B/P monitor but she has not been taking her B/P at home. Nurse provided education and the patient states she will begin to take her B/P 2 times a week and record her values. Nurse will send patient hypertension education and a calendar booklet for her to write her B/P values.     [facility]) Patient will verbalize monitoring her blood sugar 2 times weekly within the next 90 days       Timeframe:  Long-Range Goal Priority:  Medium Start Date: 07/15/20                            Expected End Date: 07/30/21                     Follow Up Date 12/30/20   -Encouraged patient to  take her blood sugar 2 times a week -Discussed writing her values down in calendar booklet -Encouraged patient to limit her sugar and carbohydrate intake  -Discussed doing chair exercises 2-3 times a week     Why is this important?   Checking your blood sugar at home helps to keep it from getting very high or very low.  Writing the results in a diary or log helps the doctor know how to care for you.  Your blood sugar log should have the time, date and the results.  Also, write down the amount of insulin or other medicine that you take.  Other information, like what you ate, exercise done and how you were feeling, will also be helpful.     Notes: 09/28/20: Patient explains that she has not begun to take her blood sugar twice a week. Patient states she will begin to check her blood sugar 2 times a week. Her last A1c was 6.0 on 07/18/20. Patient reports she did not receive a calendar booklet. Nurse will send her a calendar booklet to record her blood sugar values.  07/15/20: Patient reports she does have a blood sugar monitor but currently is not checking her blood sugar at home. Nurse provided education and patient stated will start to take her blood sugar 2 times a week and record the values. Nurse will send patient diabetes education and a calendar booklet for her to record her blood sugar values.       About Constipation  Constipation Overview Constipation is the most common gastrointestinal complaint -- about 4 million Americans experience constipation and make 2.5 million physician visits a year to get help for the problem.  Constipation can occur when the colon absorbs too much water, the colon's muscle contraction is slow or sluggish, and/or there is delayed transit time through the colon.  The result is stool that is hard and dry.  Indicators of constipation include straining during bowel movements greater than 25% of the time, having fewer than three bowel movements per week, and/or the  feeling of incomplete evacuation.  There are established guidelines ([place] II ) for defining constipation. A  person needs to have two or more of the following symptoms for at least 12 weeks (not necessarily consecutive) in the preceding 12 months: Straining in  greater than 25% of bowel movements Lumpy or hard stools in greater than 25% of bowel movements Sensation of incomplete emptying in greater than 25% of bowel movements Sensation of anorectal obstruction/blockade in greater than 25% of bowel movements Manual maneuvers to help empty greater than 25% of bowel movements (e.g., digital evacuation, support of the pelvic floor)  Less than  3 bowel movements/week Loose stools are not present, and criteria for irritable bowel syndrome are insufficient  Common Causes of Constipation Lack of fiber in your diet Lack of physical activity Medications, including iron and calcium supplements  Dairy intake Dehydration Abuse of laxatives Travel Irritable Bowel Syndrome Pregnancy Luteal phase of menstruation (after ovulation and before menses) Colorectal problems Intestinal Dysfunction  Treating Constipation  There are several ways of treating constipation, including changes to diet and exercise, use of laxatives, adjustments to the pelvic floor, and scheduled toileting.  These treatments include: increasing fiber and fluids in the diet  increasing physical activity learning muscle coordination  learning proper toileting techniques and toileting modifications  designing and sticking  to a toileting schedule     2007, [employer] Doc.[email or web address]

## 2020-09-28 NOTE — Patient Instructions (Addendum)
MEDICATION: We have sent the following medication to your pharmacy for you to pick up at your convenience: Dicyclomine 10 MG tablet, take 1 tablet 3 times a day as needed for abdominal pain.  LABS:  Lab work has been ordered for you today. Our lab is located in the basement. Press "B" on the elevator. The lab is located at the first door on the left as you exit the elevator.  PROCEDURES: You have been scheduled for an EGD and Colonoscopy. Please follow the written instructions given to you at your visit today. If you use inhalers (even only as needed), please bring them with you on the day of your procedure. Your Provider has sent your bowel prep to [facility]. Fast,Free delivery or shipping. Accept all major insurance benefits. Applies discounts and coupons.  What to expect: [facility] will contact you to verify your information and collect your co-pay, if applicable. Sit back and relax-your prescription will be delivered or shipped at no charge.  Have Additional questions? Call 1 [phone]  It was great seeing you today! Thank you for entrusting me with your care and choosing [facility].  Noralyn Pick, CRNP  The [facility] providers would like to encourage you to use [facility] to communicate with providers for non-urgent requests or questions.  Due to long hold times on the telephone, sending your provider a message by [facility] may be faster and more efficient way to get a response. Please allow 48 business hours for a response.  Please remember that this is for non-urgent requests/questions.  If you are age 70 or older, your body mass index should be between 23-30. Your Body mass index is 36.61 kg/m. If this is out of the aforementioned range listed, please consider follow up with your Primary Care Provider.  If you are age 48 or younger, your body mass index should be between 19-25. Your Body mass index is 36.61 kg/m. If this is out of the aformentioned  range listed, please consider follow up with your Primary Care Provider.

## 2020-09-28 NOTE — Progress Notes (Addendum)
09/29/2020 Connie Garrett [id number] December 28, 1960   CHIEF COMPLAINT: Constipation  HISTORY OF PRESENT ILLNESS: Connie Garrett is a 60 year old female with a past medical history of arthritis, depression, hypertension, sleep apnea not yet on CPAP. She presents to our office today as recommended by Rodrigo Ran PA-C for further evaluation for constipation.  She complains of having chronic constipation.  She previously would go 3 to 4 days without passing a bowel movement.  About 1 year ago, she took MiraLAX daily for 2 months without significant improvement.  She was started on Amitiza approximately 8 months ago by her PCP which was ineffective.  Addendum: Patient verified she is taking Trulance and no longer taking Amitiza. Trulance results in passing 2 loose stools daily which she stated was preferred than having no BM for several days. She has intermittent upper and lower abdominal pain which last for less than 5 minutes and occurs approximately 3 days weekly and is unrelated to eating.  She has a history of heartburn for which she takes Omeprazole 20 mg once daily with significant improvement.  However, if she eats spaghetti or spicy foods she develops heartburn.  She has dysphagia, describes food which gets stuck to the upper esophagus which occurs once or twice monthly for the past 2 years.  She typically drinks water and the stuck food passes down the esophagus.  She denies ever having an EGD or colonoscopy.  No family history of esophageal, gastric or colon cancer.  She fell and fractured her left arm which required a surgery, her cast will be removed on 10/06/2020.  It is unclear if she is taking Meloxicam and/or Naproxen which is on her medication list from [place] or she is on ASA and Celebrex which is listed on her epic medication list.  Past Medical History:  Diagnosis Date   Arthritis    Depression    Hypertension    Past Surgical History:  Procedure Laterality Date    ABDOMINAL HYSTERECTOMY     BACK SURGERY  1990   EYE SURGERY     TOTAL KNEE ARTHROPLASTY Right 07/18/2018   Procedure: TOTAL KNEE ARTHROPLASTY;  Surgeon: Dorna Leitz, MD;  Location: [facility];  Service: Orthopedics;  Laterality: Right;  She denies any problems with sedation/anesthesia or airway management   Social History: She smoked cigarettes age 63 for one year. No alcohol use. No drug use.   Family History: Mother deceased age 34 due heart disease. Father deceased age 56 due to lung cancer. No family history of esophageal, gastric or colon cancer.   Allergies  Allergen Reactions   Shrimp Extract Allergy Skin Test      Outpatient Encounter Medications as of 09/28/2020  Medication Sig   aspirin EC 325 MG tablet Take 1 tablet (325 mg total) by mouth 2 (two) times daily after a meal. Take x 1 month post op to decrease risk of blood clots.   atorvastatin (LIPITOR) 20 MG tablet Take 20 mg by mouth at bedtime.   buPROPion (WELLBUTRIN SR) 200 MG 12 hr tablet Take 200 mg by mouth.   celecoxib (CELEBREX) 200 MG capsule Take 200 mg by mouth daily with breakfast.   desvenlafaxine (PRISTIQ) 50 MG 24 hr tablet Take 100 mg by mouth daily.   dicyclomine (BENTYL) 10 MG capsule Take 1 capsule (10 mg total) by mouth 3 (three) times daily as needed for spasms (abdominal pain).   docusate sodium (COLACE) 100 MG capsule Take 1 capsule (100 mg total)  by mouth 2 (two) times daily.   doxepin (SINEQUAN) 25 MG capsule Take 25 mg by mouth.   escitalopram (LEXAPRO) 20 MG tablet Take 20 mg by mouth daily.   hydrOXYzine (VISTARIL) 25 MG capsule Take by mouth.   lubiprostone (AMITIZA) 24 MCG capsule 24 mcg.   metoprolol succinate (TOPROL-XL) 25 MG 24 hr tablet Take by mouth.   Multiple Vitamin (MULTIVITAMIN WITH MINERALS) TABS tablet Take 1 tablet by mouth daily.   omeprazole (PRILOSEC) 20 MG capsule Take 20 mg by mouth daily.   Sodium Sulfate-Mag Sulfate-KCl (SUTAB) [id number] MG TABS Take 1 kit by mouth as  directed.   tiZANidine (ZANAFLEX) 2 MG tablet Take 1 tablet (2 mg total) by mouth every 8 (eight) hours as needed for muscle spasms.   TRULANCE 3 MG TABS Take 1 tablet by mouth daily.   [DISCONTINUED] desloratadine (CLARINEX) 5 MG tablet Take 5 mg by mouth daily.   No facility-administered encounter medications on file as of 09/28/2020.   REVIEW OF SYSTEMS:  Gen: Denies fever, sweats or chills. No weight loss.  CV: Denies chest pain, palpitations or edema. Resp: Denies cough, shortness of breath of hemoptysis.  GI: See HPI. GU : Denies urinary burning, blood in urine, increased urinary frequency or incontinence. MS: Denies joint pain, muscles aches or weakness. Derm: Denies rash, itchiness, skin lesions or unhealing ulcers. Psych: + Anxiety and depression.  Heme: Denies bruising, bleeding. Neuro:  Denies headaches, dizziness or paresthesias. Endo:  Denies any problems with DM, thyroid or adrenal function.  PHYSICAL EXAM: BP 140/74   Pulse 70   Ht 5' 5"  (1.651 m)   Wt 220 lb (99.8 kg)   BMI 36.61 kg/m  General: 60 year old female in no acute distress. Head: Normocephalic and atraumatic. Eyes:  Right pupil > left (patient reports due to eye surgery as a child). Sclerae non-icteric, conjunctive pink. Ears: Normal auditory acuity. Mouth: Upper and lower dentures. No ulcers or lesions.  Neck: Supple, no lymphadenopathy or thyromegaly.  Lungs: Clear bilaterally to auscultation without wheezes, crackles or rhonchi. Heart: Regular rate and rhythm. No murmur, rub or gallop appreciated.  Abdomen: Soft, non distended. Tenderness above the umbilicus and throughout the lower abdomen without rebound or guarding. No masses. No hepatosplenomegaly. Normoactive bowel sounds x 4 quadrants.  Rectal: Deferred. Musculoskeletal: Symmetrical with no gross deformities. Skin: Warm and dry. No rash or lesions on visible extremities. Extremities: Left arm in cast. No lower extremity edema. Neurological:  Alert oriented x 4, no focal deficits.  Psychological:  Alert and cooperative. Normal mood and affect.  ASSESSMENT AND PLAN: 5) 60 year old female with constipation. Addendum: Patient was called after her office appointment to verify her medications. She verified she is no longer taking Amitiza but is taking Trulance 13m po QD.   -TSH -Continue Trulance QD for now  2) GERD, dysphagia. + NSAID use. -Continue Omeprazole 20 mg daily -EGD with possible esophageal dilatation benefits and risks discussed including risk with sedation, risk of bleeding, perforation and infection   3) Colon cancer screening  -Colonoscopy benefits and risks discussed including risk with sedation, risk of bleeding, perforation and infection   4) Chronic intermittent upper and lower abdominal pain  -CBC, CMP -Dicyclomine 10 mg 1 p.o. 3 times daily as needed abdominal pain -EGD and colonoscopy -Consider CTAP if abdominal pains worsen  I will I will contact the patient when she returns home so she can verify which medication she is currently taking as there there are  multiple discrepancies in her [facility] medication list when compared to her epic medication list.        CC:  Marco Collie, MD

## 2020-09-28 NOTE — Patient Outreach (Addendum)
[facility]) Care Management  [place]  09/28/2020   Connie Garrett 11-08-1960 [id number]  Subjective: Successful telephone outreach call to patient. HIPAA identifiers obtained.  Nurse discussed with patient her health goals and her health and wellness needs which were documented in the Epic system. Patient did not have any further questions or concerns today and did confirm that she has this nurse's contact number to call her if needed.   Encounter Medications:  Outpatient Encounter Medications as of 09/28/2020  Medication Sig Note   aspirin EC 325 MG tablet Take 1 tablet (325 mg total) by mouth 2 (two) times daily after a meal. Take x 1 month post op to decrease risk of blood clots.    atorvastatin (LIPITOR) 20 MG tablet Take 20 mg by mouth at bedtime.    buPROPion (WELLBUTRIN SR) 200 MG 12 hr tablet Take 200 mg by mouth. 09/26/2020: D/c 300 mg   celecoxib (CELEBREX) 200 MG capsule Take 200 mg by mouth daily with breakfast.    desvenlafaxine (PRISTIQ) 50 MG 24 hr tablet Take 100 mg by mouth daily.    dicyclomine (BENTYL) 10 MG capsule Take 1 capsule (10 mg total) by mouth 3 (three) times daily as needed for spasms (abdominal pain).    docusate sodium (COLACE) 100 MG capsule Take 1 capsule (100 mg total) by mouth 2 (two) times daily.    doxepin (SINEQUAN) 25 MG capsule Take 25 mg by mouth. 09/26/2020: for sleep   escitalopram (LEXAPRO) 20 MG tablet Take 20 mg by mouth daily.    hydrOXYzine (VISTARIL) 25 MG capsule Take by mouth.    lubiprostone (AMITIZA) 24 MCG capsule 24 mcg.    metoprolol succinate (TOPROL-XL) 25 MG 24 hr tablet Take by mouth. 2/50/5397: duplicate   Multiple Vitamin (MULTIVITAMIN WITH MINERALS) TABS tablet Take 1 tablet by mouth daily.    omeprazole (PRILOSEC) 20 MG capsule Take 20 mg by mouth daily.    Sodium Sulfate-Mag Sulfate-KCl (SUTAB) [phone] MG TABS Take 1 kit by mouth as directed.    tiZANidine (ZANAFLEX) 2 MG tablet Take 1 tablet  (2 mg total) by mouth every 8 (eight) hours as needed for muscle spasms.    TRULANCE 3 MG TABS Take 1 tablet by mouth daily.    No facility-administered encounter medications on file as of 09/28/2020.    Functional Status:  No flowsheet data found.  Fall/Depression Screening: Fall Risk  09/28/2020 07/15/2020 07/15/2020  Falls in the past year? 1 0 0  Number falls in past yr: 0 0 0  Injury with Fall? 1 0 0  Risk for fall due to : Impaired mobility;Impaired balance/gait;History of fall(s);Impaired vision Impaired balance/gait Impaired mobility  Follow up Falls prevention discussed;Education provided;Falls evaluation completed Falls prevention discussed;Education provided;Falls evaluation completed Falls prevention discussed;Education provided;Falls evaluation completed   PHQ 2/9 Scores 07/15/2020  PHQ - 2 Score 4  PHQ- 9 Score 12    Assessment:   Care Plan Care Plan : Diabetes Type 2 (Adult)  Updates made by Michiel Cowboy, RN since 09/28/2020 12:00 AM     Problem: Glycemic Management (Diabetes, Type 2)   Priority: Medium     Long-Range Goal: Glycemic Management Optimized   Start Date: 09/28/2020  Expected End Date: 09/30/2021  Note:   Evidence-based guidance:  Anticipate A1C testing (point-of-care) every 3 to 6 months based on goal attainment.  Review mutually-set A1C goal or target range.  Anticipate use of antihyperglycemic with or without insulin and periodic adjustments; consider  active involvement of pharmacist.  Provide medical nutrition therapy and development of individualized eating.  Compare self-reported symptoms of hypo or hyperglycemia to blood glucose levels, diet and fluid intake, current medications, psychosocial and physiologic stressors, change in activity and barriers to care adherence.  Promote self-monitoring of blood glucose levels.  Assess and address barriers to management plan, such as food insecurity, age, developmental ability, depression, anxiety, fear of  hypoglycemia or weight gain, as well as medication cost, side effects and complicated regimen.  Consider referral to community-based diabetes education program, visiting nurse, community health worker or health coach.  Encourage regular dental care for treatment of periodontal disease; refer to dental provider when needed.   Notes:     Task: Alleviate Barriers to Glycemic Management   Due Date: 09/30/2021  Note:   Care Management Activities:    - blood glucose monitoring encouraged - individualized medical nutrition therapy provided - use of blood glucose monitoring log promoted    Notes:       Goals Addressed             This Visit's Progress    [facility]) Patient will verbalize losing 2-3 pounds within the next 90 days       Timeframe:  Long-Range Goal Priority:  Medium Start Date:  07/15/20                           Expected End Date:  07/23/21                    Follow Up Date: 12/30/20  -Discussed decreasing the amount of Pepsi she drinks daily (currently drinking 3) -Encouraged continuation of decreasing the amount of candy and chips she eats -Discussed drinking 2 bottles of flavored water a day to stay hydrated -Discussed doing chair exercises 2-3 times a week -Encouraged patient to make healthier food choices -Encouraged patient to increase the amount of fruits and vegetables she eats daily  Notes: 09/28/20: Patient reports that she has decreased the amount of Pepsi she drinks to 3 a day. She explains that she has continued to limit the amount of candy and chips she eats and is drinking one bottle of flavor water a day. Patient states she has not lost any weight and has actually gained weight. Nurse praised the patient for making healthier food choices and encouraged her to increase her physical activity by doing chair exercises. Discussed eating more fruits and vegetables and that constipation can cause weight gain. Nurse will send constipation education and patient states  that she needs another printout of chair exercises that nurse will send.   07/15/20: Patient states that she does want to lose as much weight as possible. She reports that she does drink about 6 Pepsi's a day and feels she can decrease this to 4 a day. Patient has already decreased the amount of candy and chips she eats. She states that she will begin to drink 2 flavored waters a day and will do chair exercises 2-3 times a week. Nurse will send patient a planning healthy meals booklet,weight loss tips,  and chair exercise printouts.      [facility]) Patient will verbalize monitoring her B/P 2 times a week within the next 90 days       Timeframe:  Long-Range Goal Priority:  Medium Start Date: 07/15/20  Expected End Date: 07/30/21                      Follow Up Date 12/30/20    - write blood pressure results in a log or diary -Encouraged patient to take her B/P 2 times a week -Discussed nurse will send a calendar booklet for patient to write down her B/P values  -Discussed doing chair exercises 2-3 times a week -Encourage patient to eat a low salt diet   Why is this important?   You won't feel high blood pressure, but it can still hurt your blood vessels.  High blood pressure can cause heart or kidney problems. It can also cause a stroke.  Making lifestyle changes like losing a little weight or eating less salt will help.  Checking your blood pressure at home and at different times of the day can help to control blood pressure.  If the doctor prescribes medicine remember to take it the way the doctor ordered.  Call the office if you cannot afford the medicine or if there are questions about it.     Notes: 09/28/20: Patient states she has not started to take her B/P 2 times a week but she will begin soon. She did not receive the calendar booklet and nurse will send her a calendar booklet to record her B/P values and education about how to take your blood pressure.   07/15/20:  Patient reports that she does have a B/P monitor but she has not been taking her B/P at home. Nurse provided education and the patient states she will begin to take her B/P 2 times a week and record her values. Nurse will send patient hypertension education and a calendar booklet for her to write her B/P values.     [facility]) Patient will verbalize monitoring her blood sugar 2 times weekly within the next 90 days       Timeframe:  Long-Range Goal Priority:  Medium Start Date: 07/15/20                            Expected End Date: 07/30/21                     Follow Up Date 12/30/20   -Encouraged patient to take her blood sugar 2 times a week -Discussed writing her values down in calendar booklet -Encouraged patient to limit her sugar and carbohydrate intake  -Discussed doing chair exercises 2-3 times a week     Why is this important?   Checking your blood sugar at home helps to keep it from getting very high or very low.  Writing the results in a diary or log helps the doctor know how to care for you.  Your blood sugar log should have the time, date and the results.  Also, write down the amount of insulin or other medicine that you take.  Other information, like what you ate, exercise done and how you were feeling, will also be helpful.     Notes: 09/28/20: Patient explains that she has not begun to take her blood sugar twice a week. Patient states she will begin to check her blood sugar 2 times a week. Her last A1c was 6.0 on 07/18/20. Patient reports she did not receive a calendar booklet. Nurse will send her a calendar booklet to record her blood sugar values.  07/15/20: Patient reports she does have a blood sugar monitor  but currently is not checking her blood sugar at home. Nurse provided education and patient stated will start to take her blood sugar 2 times a week and record the values. Nurse will send patient diabetes education and a calendar booklet for her to record her blood sugar  values.       Plan: RN Health Coach will send patient hypertension, constipation, diabetes education. Will send patient a calendar booklet, weight loss tips, and chair exercise printouts. Will call patient within the month of December. Follow-up: Patient agrees to Care Plan and Follow-up.  Emelia Loron RN, BSN [place] [phone] [email or web address]

## 2020-09-29 ENCOUNTER — Telehealth: Payer: Self-pay

## 2020-09-29 ENCOUNTER — Other Ambulatory Visit: Payer: Self-pay

## 2020-09-29 DIAGNOSIS — K5900 Constipation, unspecified: Secondary | ICD-10-CM | POA: Insufficient documentation

## 2020-09-29 DIAGNOSIS — K5909 Other constipation: Secondary | ICD-10-CM | POA: Insufficient documentation

## 2020-09-29 DIAGNOSIS — R1310 Dysphagia, unspecified: Secondary | ICD-10-CM | POA: Insufficient documentation

## 2020-09-29 NOTE — Telephone Encounter (Signed)
I have left a message with the patient to call me and confirm the pharmacies she uses. [facility] has an integrated medication list where the medications are listed from 2019 to present and all the dates they were filled. This is under the "Misc  Rpts" tab. Trulance was last filled 09/23/20. Amitiza was last filled 03/22/20. No recent NSAIDs are listed. I will let you know what else I find.

## 2020-09-29 NOTE — Progress Notes (Signed)
Agree with assessment and plan as outlined.  

## 2020-09-29 NOTE — Progress Notes (Signed)
Connie Garrett I called the patient earlier this morning she did not answer.  Can you please contact the patient and or her PCP to clarify her medication list as there is significant discrepancy regarding her medication list in epic when compared to the list found in care everywhere.  Refer to 09/28/2020 consult, I primarily want to make sure that she is not taking numerous NSAIDs and I want to make sure she is not taking Trulance and Amitiza.  Thank you

## 2020-09-29 NOTE — Telephone Encounter (Signed)
Spoke with the patient. She uses [facility] for her medications. Patient speeled out each of the prescriptions she is taking. Medication list updated. Confirmed she is not taking Amitiza. She is taking Trulance. She does not take any OTC except her multi-vitamin.

## 2020-09-29 NOTE — Telephone Encounter (Addendum)
Progress Notes by Noralyn Pick, NP at 09/28/2020 10:30 AM  Author: Noralyn Pick, NP Author Type: Nurse Practitioner Filed: 09/29/2020  1:21 PM     Beth I called the patient earlier this morning she did not answer.  Can you please contact the patient and or her PCP to clarify her medication list as there is significant discrepancy regarding her medication list in epic when compared to the list found in care everywhere.  Refer to 09/28/2020 consult, I primarily want to make sure that she is not taking numerous NSAIDs and I want to make sure she is not taking Trulance and Amitiza.  Thank you

## 2020-10-01 DIAGNOSIS — E1169 Type 2 diabetes mellitus with other specified complication: Secondary | ICD-10-CM | POA: Diagnosis not present

## 2020-10-01 DIAGNOSIS — E785 Hyperlipidemia, unspecified: Secondary | ICD-10-CM | POA: Diagnosis not present

## 2020-10-04 ENCOUNTER — Telehealth: Payer: Self-pay

## 2020-10-04 NOTE — Telephone Encounter (Signed)
-----   Message from Noralyn Pick, NP sent at 10/03/2020  9:29 AM EDT ----- Lenna Sciara please inform the patient her lab results were all normal. Thank you

## 2020-10-04 NOTE — Telephone Encounter (Signed)
Notified patient of message regarding normal lab result , patient expressed understanding and agreement, no further questions at this time.

## 2020-10-06 DIAGNOSIS — M189 Osteoarthritis of first carpometacarpal joint, unspecified: Secondary | ICD-10-CM | POA: Diagnosis not present

## 2020-10-06 DIAGNOSIS — M19012 Primary osteoarthritis, left shoulder: Secondary | ICD-10-CM | POA: Diagnosis not present

## 2020-10-06 DIAGNOSIS — Z8781 Personal history of (healed) traumatic fracture: Secondary | ICD-10-CM | POA: Diagnosis not present

## 2020-10-06 DIAGNOSIS — S52572D Other intraarticular fracture of lower end of left radius, subsequent encounter for closed fracture with routine healing: Secondary | ICD-10-CM | POA: Diagnosis not present

## 2020-10-06 DIAGNOSIS — Z9889 Other specified postprocedural states: Secondary | ICD-10-CM | POA: Diagnosis not present

## 2020-10-06 DIAGNOSIS — S52572A Other intraarticular fracture of lower end of left radius, initial encounter for closed fracture: Secondary | ICD-10-CM | POA: Diagnosis not present

## 2020-10-06 DIAGNOSIS — X58XXXD Exposure to other specified factors, subsequent encounter: Secondary | ICD-10-CM | POA: Diagnosis not present

## 2020-10-10 DIAGNOSIS — T8484XA Pain due to internal orthopedic prosthetic devices, implants and grafts, initial encounter: Secondary | ICD-10-CM | POA: Diagnosis not present

## 2020-10-10 DIAGNOSIS — T8489XA Other specified complication of internal orthopedic prosthetic devices, implants and grafts, initial encounter: Secondary | ICD-10-CM | POA: Diagnosis not present

## 2020-10-10 DIAGNOSIS — G5602 Carpal tunnel syndrome, left upper limb: Secondary | ICD-10-CM | POA: Diagnosis not present

## 2020-10-10 DIAGNOSIS — W19XXXD Unspecified fall, subsequent encounter: Secondary | ICD-10-CM | POA: Diagnosis not present

## 2020-10-24 DIAGNOSIS — S52572A Other intraarticular fracture of lower end of left radius, initial encounter for closed fracture: Secondary | ICD-10-CM | POA: Diagnosis not present

## 2020-11-01 DIAGNOSIS — E785 Hyperlipidemia, unspecified: Secondary | ICD-10-CM | POA: Diagnosis not present

## 2020-11-01 DIAGNOSIS — E1169 Type 2 diabetes mellitus with other specified complication: Secondary | ICD-10-CM | POA: Diagnosis not present

## 2020-11-03 ENCOUNTER — Other Ambulatory Visit: Payer: Self-pay | Admitting: *Deleted

## 2020-11-03 NOTE — Patient Outreach (Signed)
[facility]) Care Management  11/03/2020  Connie Garrett 1960/01/15 [id number]  Unsuccessful outreach attempt made to patient. Patient answered the phone and stated that she would not be able to speak today. She did request that this nurse call back tomorrow at 0900.  Plan: RN Health Coach will call patient on 11/04/20 at 0900.  Emelia Loron RN, BSN [place] [phone] [email or web address]

## 2020-11-04 ENCOUNTER — Other Ambulatory Visit: Payer: Self-pay | Admitting: *Deleted

## 2020-11-04 NOTE — Patient Outreach (Signed)
[facility]) Care Management  11/04/2020  Connie Garrett [birth year] [id number]  Unsuccessful outreach attempt made to patient. Patient answered the phone and stated that she would not be able to speak today. She did request that this nurse call back at a later date.   Plan: RN Health Coach will call patient within the month of December.  Emelia Loron RN, BSN [place] [phone] [email or web address]

## 2020-11-10 ENCOUNTER — Telehealth: Payer: Self-pay | Admitting: Gastroenterology

## 2020-11-10 NOTE — Telephone Encounter (Signed)
Patient contacted the on call provider this evening because she experienced acute onset nausea vomiting shortly after taking the first half of the Sutab bowel prep this evening.  I spoke with her home health aid who states that the patient took 12 tablets of the Sutab at 6:05 pm and at 6:40 she felt very nauseated and vomited (unknown if the tablets were in the emesis).  She did start having bowel movements shortly thereafter.  She also was experiencing headaches with dizziness.  The home health aid took her vitals which were reassuring (BP 154/84, HR 94, Sat 96%, BG 96) She is scheduled for an EGD and colonoscopy tomorrow.   I suggested that she try taking some zofran to help with the n/v, but that if she was continuing to feel dizzy with headaches, then it may be best to abort the bowel prep, as these are not typical side effects and may potentially suggest electrolytes disturbances.  If she cannot complete the bowel prep, then the EGD can still be completed.   If she started to feel better, I recommended she continue the bowel prep, but that it was okay to divide up the Sutab, and take 4-6 tabs at a time, and not do all of them at once.  I told her it was okay to use other clear liquids besides water that may be more palatable. A prescription for ODT Zofran was ordered for pick up at the [facility] in [place].  Amada Hallisey E. Candis Schatz, MD [facility]

## 2020-11-11 ENCOUNTER — Ambulatory Visit (AMBULATORY_SURGERY_CENTER): Payer: Medicare HMO | Admitting: Gastroenterology

## 2020-11-11 ENCOUNTER — Other Ambulatory Visit: Payer: Self-pay

## 2020-11-11 ENCOUNTER — Encounter: Payer: Self-pay | Admitting: Gastroenterology

## 2020-11-11 VITALS — BP 146/65 | HR 84 | Temp 97.5°F | Resp 18 | Ht 65.0 in | Wt 220.0 lb

## 2020-11-11 DIAGNOSIS — R1310 Dysphagia, unspecified: Secondary | ICD-10-CM

## 2020-11-11 DIAGNOSIS — D374 Neoplasm of uncertain behavior of colon: Secondary | ICD-10-CM | POA: Diagnosis not present

## 2020-11-11 DIAGNOSIS — D126 Benign neoplasm of colon, unspecified: Secondary | ICD-10-CM | POA: Diagnosis not present

## 2020-11-11 DIAGNOSIS — K900 Celiac disease: Secondary | ICD-10-CM | POA: Diagnosis not present

## 2020-11-11 DIAGNOSIS — D123 Benign neoplasm of transverse colon: Secondary | ICD-10-CM

## 2020-11-11 DIAGNOSIS — D122 Benign neoplasm of ascending colon: Secondary | ICD-10-CM

## 2020-11-11 DIAGNOSIS — D125 Benign neoplasm of sigmoid colon: Secondary | ICD-10-CM | POA: Diagnosis not present

## 2020-11-11 DIAGNOSIS — K635 Polyp of colon: Secondary | ICD-10-CM | POA: Diagnosis not present

## 2020-11-11 DIAGNOSIS — K219 Gastro-esophageal reflux disease without esophagitis: Secondary | ICD-10-CM | POA: Diagnosis not present

## 2020-11-11 DIAGNOSIS — D120 Benign neoplasm of cecum: Secondary | ICD-10-CM

## 2020-11-11 DIAGNOSIS — Z1211 Encounter for screening for malignant neoplasm of colon: Secondary | ICD-10-CM | POA: Diagnosis not present

## 2020-11-11 DIAGNOSIS — R69 Illness, unspecified: Secondary | ICD-10-CM | POA: Diagnosis not present

## 2020-11-11 DIAGNOSIS — I10 Essential (primary) hypertension: Secondary | ICD-10-CM | POA: Diagnosis not present

## 2020-11-11 MED ORDER — SODIUM CHLORIDE 0.9 % IV SOLN: 500.0000 mL | Freq: Once | INTRAVENOUS | Status: DC

## 2020-11-11 NOTE — Progress Notes (Signed)
Called to room to assist during endoscopic procedure.  Patient ID and intended procedure confirmed with present staff. Received instructions for my participation in the procedure from the performing physician.  

## 2020-11-11 NOTE — Op Note (Signed)
[place] Patient Name: Connie Garrett Procedure Date: 11/11/2020 10:11 AM MRN: [id number] Endoscopist: Remo Lipps P. Havery Moros , MD Age: 60 Referring MD:  Date of Birth: Jan 01, 1961 Gender: Female Account #: [id number] Procedure:                Colonoscopy Indications:              Screening for colorectal malignant neoplasm, This                            is the patient's first colonoscopy Medicines:                Monitored Anesthesia Care Procedure:                Pre-Anesthesia Assessment:                           - Prior to the procedure, a History and Physical                            was performed, and patient medications and                            allergies were reviewed. The patient's tolerance of                            previous anesthesia was also reviewed. The risks                            and benefits of the procedure and the sedation                            options and risks were discussed with the patient.                            All questions were answered, and informed consent                            was obtained. Prior Anticoagulants: The patient has                            taken no previous anticoagulant or antiplatelet                            agents. ASA Grade Assessment: II - A patient with                            mild systemic disease. After reviewing the risks                            and benefits, the patient was deemed in                            satisfactory condition to undergo the procedure.  After obtaining informed consent, the colonoscope                            was passed under direct vision. Throughout the                            procedure, the patient's blood pressure, pulse, and                            oxygen saturations were monitored continuously. The                            Olympus PCF-H190DL (#[id number]) Colonoscope was                            introduced through  the anus and advanced to the the                            cecum, identified by appendiceal orifice and                            ileocecal valve. The colonoscopy was performed                            without difficulty. The patient tolerated the                            procedure well. The quality of the bowel                            preparation was adequate. The ileocecal valve,                            appendiceal orifice, and rectum were photographed. Scope In: 10:43:37 AM Scope Out: 11:11:52 AM Scope Withdrawal Time: 0 hours 22 minutes 9 seconds  Total Procedure Duration: 0 hours 28 minutes 15 seconds  Findings:                 The perianal and digital rectal examinations were                            normal.                           Two sessile polyps were found in the cecum. The                            polyps were 3 mm in size. These polyps were removed                            with a cold snare. Resection and retrieval were                            complete.  Two sessile polyps were found in the ileocecal                            valve. The polyps were 3 to 10 mm in size. These                            polyps were removed with a cold snare. Resection                            and retrieval were complete.                           Three sessile polyps were found in the ascending                            colon. The polyps were 3 to 4 mm in size. These                            polyps were removed with a cold snare. Resection                            and retrieval were complete.                           Three sessile polyps were found in the hepatic                            flexure. The polyps were 3 to 5 mm in size. These                            polyps were removed with a cold snare. Resection                            and retrieval were complete.                           A 4 mm polyp was found in the transverse  colon. The                            polyp was sessile. The polyp was removed with a                            cold snare. Resection and retrieval were complete.                           A 5 mm polyp was found in the sigmoid colon. The                            polyp was sessile. The polyp was removed with a                            cold snare. Resection and  retrieval were complete.                           Multiple small-mouthed diverticula were found in                            the sigmoid colon.                           Internal hemorrhoids were found during retroflexion.                           A large amount of liquid stool was found in the                            entire colon, making visualization difficult.                            Lavage of the colon was performed using copious                            amounts of sterile water, resulting in clearance                            with adequate visualization. This process took                            several minutes.                           The exam was otherwise without abnormality. Complications:            No immediate complications. Estimated blood loss:                            Minimal. Estimated Blood Loss:     Estimated blood loss was minimal. Impression:               - Two 3 mm polyps in the cecum, removed with a cold                            snare. Resected and retrieved.                           - Two 3 to 10 mm polyps at the ileocecal valve,                            removed with a cold snare. Resected and retrieved.                           - Three 3 to 4 mm polyps in the ascending colon,                            removed with a cold snare. Resected and retrieved.                           -  Three 3 to 5 mm polyps at the hepatic flexure,                            removed with a cold snare. Resected and retrieved.                           - One 4 mm polyp in the transverse colon, removed                             with a cold snare. Resected and retrieved.                           - One 5 mm polyp in the sigmoid colon, removed with                            a cold snare. Resected and retrieved.                           - Diverticulosis in the sigmoid colon.                           - Internal hemorrhoids.                           - Stool in the entire examined colon, time taken to                            lavage which achieved adequate views.                           - The examination was otherwise normal. Recommendation:           - Patient has a contact number available for                            emergencies. The signs and symptoms of potential                            delayed complications were discussed with the                            patient. Return to normal activities tomorrow.                            Written discharge instructions were provided to the                            patient.                           - Resume previous diet.                           - Continue present medications.                           -  Await pathology results. Remo Lipps P. Laurene Melendrez, MD 11/11/2020 11:21:46 AM This report has been signed electronically.

## 2020-11-11 NOTE — Telephone Encounter (Signed)
Scott thanks for taking this call.  Can someone from [facility] call this patient and see how she is doing? Hopefully she was able to handle the prep last night and do her double procedure. If not, she should still be able to get the EGD done hopefully. Thanks

## 2020-11-11 NOTE — Op Note (Signed)
[place] Patient Name: Connie Garrett Procedure Date: 11/11/2020 10:17 AM MRN: [id number] Endoscopist: Remo Lipps P. Havery Moros , MD Age: 60 Referring MD:  Date of Birth: 09-30-1960 Gender: Female Account #: [id number] Procedure:                Upper GI endoscopy Indications:              Dysphagia, History of gastro-esophageal reflux                            disease Medicines:                Monitored Anesthesia Care Procedure:                Pre-Anesthesia Assessment:                           - Prior to the procedure, a History and Physical                            was performed, and patient medications and                            allergies were reviewed. The patient's tolerance of                            previous anesthesia was also reviewed. The risks                            and benefits of the procedure and the sedation                            options and risks were discussed with the patient.                            All questions were answered, and informed consent                            was obtained. Prior Anticoagulants: The patient has                            taken no previous anticoagulant or antiplatelet                            agents. ASA Grade Assessment: II - A patient with                            mild systemic disease. After reviewing the risks                            and benefits, the patient was deemed in                            satisfactory condition to undergo the procedure.  After obtaining informed consent, the endoscope was                            passed under direct vision. Throughout the                            procedure, the patient's blood pressure, pulse, and                            oxygen saturations were monitored continuously. The                            Endoscope was introduced through the mouth, and                            advanced to the second part of duodenum.  The upper                            GI endoscopy was accomplished without difficulty.                            The patient tolerated the procedure well. Scope In: Scope Out: Findings:                 Esophagogastric landmarks were identified: the                            Z-line was found at 35 cm, the gastroesophageal                            junction was found at 35 cm and the upper extent of                            the gastric folds was found at 35 cm from the                            incisors. Z line regular                           The exam of the esophagus was otherwise normal. No                            focal stenosis / stricture noted.                           A guidewire was placed and the scope was withdrawn.                            Empiric dilation was performed in the entire                            esophagus with a Savary dilator with mild  resistance at 17 mm (no heme) and then with 18 mm                            (heme noted). Relook endoscopy showed appropriate                            mucosal wrent just inferior to the UES at site of                            subtle stenosis which was not appreciated                            endoscopically.                           The entire examined stomach was normal.                           Flattening and mild edema was found in the duodenal                            bulb and in the second portion of the duodenum.                            Biopsies for histology were taken with a cold                            forceps for evaluation of celiac disease.                           The exam of the duodenum was otherwise normal. Complications:            No immediate complications. Estimated blood loss:                            Minimal. Estimated Blood Loss:     Estimated blood loss was minimal. Impression:               - Esophagogastric landmarks identified.                            - Normal esophagus otherwise - empiric dilation                            performed to 29m at above revealing subtle                            stenosis inferior to UES which is the site of                            likely dysphagia                           - Normal stomach.                           -  Flattened and slightly edematous mucosa was found                            in the duodenum. Biopsied.                           - Normal duodenum otherwise. Recommendation:           - Patient has a contact number available for                            emergencies. The signs and symptoms of potential                            delayed complications were discussed with the                            patient. Return to normal activities tomorrow.                            Written discharge instructions were provided to the                            patient.                           - Post dilation diet                           - Continue present medications.                           - Await pathology results. Remo Lipps P. Kyann Heydt, MD 11/11/2020 11:29:00 AM This report has been signed electronically.

## 2020-11-11 NOTE — Progress Notes (Signed)
Sedate, gd SR, tolerated procedure well, VSS, report to RN 

## 2020-11-11 NOTE — Telephone Encounter (Signed)
Spoke with pts aid.  She states that her stools are clear this morning and she is planning to come in.

## 2020-11-11 NOTE — Progress Notes (Signed)
Pt's states no medical or surgical changes since previsit or office visit.VS by [place].

## 2020-11-11 NOTE — Telephone Encounter (Signed)
Great - thanks

## 2020-11-11 NOTE — Progress Notes (Signed)
[facility] History and Physical   Primary Care Physician:  Marco Collie, MD   Reason for Procedure:   Dysphagia, GERD, colon cancer screening  Plan:    EGD and colonoscopy     HPI: Connie Garrett is a 60 y.o. female  here for colonoscopy screening - first time exam, and EGD to evaluate dysphagia in the setting of history of reflux, no prior EGD. She has baseline constipation controlled with Trulance. No family history of colon cancer known. Otherwise feels well without any cardiopulmonary symptoms.    Past Medical History:  Diagnosis Date   Arthritis    Depression    Hypertension     Past Surgical History:  Procedure Laterality Date   ABDOMINAL HYSTERECTOMY     BACK SURGERY  1990   EYE SURGERY     TOTAL KNEE ARTHROPLASTY Right 07/18/2018   Procedure: TOTAL KNEE ARTHROPLASTY;  Surgeon: Dorna Leitz, MD;  Location: [facility];  Service: Orthopedics;  Laterality: Right;    Prior to Admission medications   Medication Sig Start Date End Date Taking? Authorizing Provider  atorvastatin (LIPITOR) 20 MG tablet Take 20 mg by mouth at bedtime. 07/27/20  Yes [provider]  buPROPion (WELLBUTRIN SR) 200 MG 12 hr tablet Take 200 mg by mouth. 09/13/20  Yes [provider]  dicyclomine (BENTYL) 10 MG capsule Take 1 capsule (10 mg total) by mouth 3 (three) times daily as needed for spasms (abdominal pain). 09/28/20  Yes Noralyn Pick, NP  escitalopram (LEXAPRO) 20 MG tablet Take 20 mg by mouth daily.   Yes [provider]  metoprolol succinate (TOPROL-XL) 25 MG 24 hr tablet Take by mouth.   Yes [provider]  Multiple Vitamin (MULTIVITAMIN WITH MINERALS) TABS tablet Take 1 tablet by mouth daily.   Yes [provider]  omeprazole (PRILOSEC) 20 MG capsule Take 20 mg by mouth daily.   Yes [provider]  tiZANidine (ZANAFLEX) 2 MG tablet Take 1 tablet (2 mg total) by mouth every 8 (eight) hours as needed for muscle  spasms. 07/19/18  Yes Laliberte, Danielle, PA-C  TRULANCE 3 MG TABS Take 1 tablet by mouth daily. 08/08/20  Yes [provider]    Current Outpatient Medications  Medication Sig Dispense Refill   atorvastatin (LIPITOR) 20 MG tablet Take 20 mg by mouth at bedtime.     buPROPion (WELLBUTRIN SR) 200 MG 12 hr tablet Take 200 mg by mouth.     dicyclomine (BENTYL) 10 MG capsule Take 1 capsule (10 mg total) by mouth 3 (three) times daily as needed for spasms (abdominal pain). 30 capsule 1   escitalopram (LEXAPRO) 20 MG tablet Take 20 mg by mouth daily.     metoprolol succinate (TOPROL-XL) 25 MG 24 hr tablet Take by mouth.     Multiple Vitamin (MULTIVITAMIN WITH MINERALS) TABS tablet Take 1 tablet by mouth daily.     omeprazole (PRILOSEC) 20 MG capsule Take 20 mg by mouth daily.     tiZANidine (ZANAFLEX) 2 MG tablet Take 1 tablet (2 mg total) by mouth every 8 (eight) hours as needed for muscle spasms. 40 tablet 0   TRULANCE 3 MG TABS Take 1 tablet by mouth daily.     Current Facility-Administered Medications  Medication Dose Route Frequency Provider Last Rate Last Admin   0.9 %  sodium chloride infusion  500 mL Intravenous Once Ibrahima Holberg, Carlota Raspberry, MD        Allergies as of 11/11/2020 - Review Complete 11/11/2020  Allergen Reaction Noted   Shrimp extract allergy skin test  07/14/2017    Family History  Problem Relation Age of Onset   Colon cancer Neg Hx    Colon polyps Neg Hx    Rectal cancer Neg Hx    Esophageal cancer Neg Hx    Stomach cancer Neg Hx     Social History   Socioeconomic History   Marital status: Single    Spouse name: Not on file   Number of children: 2   Years of education: Not on file   Highest education level: Not on file  Occupational History   Not on file  Tobacco Use   Smoking status: Never   Smokeless tobacco: Never  Vaping Use   Vaping Use: Never used  Substance and Sexual Activity   Alcohol use: Never   Drug use: Not Currently   Sexual  activity: Not Currently  Other Topics Concern   Not on file  Social History Narrative   Not on file   Social Determinants of Health   Financial Resource Strain: Not on file  Food Insecurity: No Food Insecurity   Worried About Running Out of Food in the Last Year: Never true   Ran Out of Food in the Last Year: Never true  Transportation Needs: No Transportation Needs   Lack of Transportation (Medical): No   Lack of Transportation (Non-Medical): No  Physical Activity: Not on file  Stress: Not on file  Social Connections: Not on file  Intimate Partner Violence: Not on file    Review of Systems: All other review of systems negative except as mentioned in the HPI.  Physical Exam: Vital signs BP 136/77   Pulse 74   Temp (!) 97.5 F (36.4 C)   Ht 5' 5"  (1.651 m)   Wt 220 lb (99.8 kg)   SpO2 96%   BMI 36.61 kg/m   General:   Alert,  Well-developed, pleasant and cooperative in NAD Lungs:  Clear throughout to auscultation.   Heart:  Regular rate and rhythm Abdomen:  Soft, nontender and nondistended.   Neuro/Psych:  Alert and cooperative. Normal mood and affect. A and O x 3  Jolly Mango, MD [facility]

## 2020-11-11 NOTE — Patient Instructions (Signed)
Please read handouts provided. Continue present medications. Await pathology results. Follow Dilation Diet.   YOU HAD AN ENDOSCOPIC PROCEDURE TODAY AT [facility]:   Refer to the procedure report that was given to you for any specific questions about what was found during the examination.  If the procedure report does not answer your questions, please call your gastroenterologist to clarify.  If you requested that your care partner not be given the details of your procedure findings, then the procedure report has been included in a sealed envelope for you to review at your convenience later.  YOU SHOULD EXPECT: Some feelings of bloating in the abdomen. Passage of more gas than usual.  Walking can help get rid of the air that was put into your GI tract during the procedure and reduce the bloating. If you had a lower endoscopy (such as a colonoscopy or flexible sigmoidoscopy) you may notice spotting of blood in your stool or on the toilet paper. If you underwent a bowel prep for your procedure, you may not have a normal bowel movement for a few days.  Please Note:  You might notice some irritation and congestion in your nose or some drainage.  This is from the oxygen used during your procedure.  There is no need for concern and it should clear up in a day or so.  SYMPTOMS TO REPORT IMMEDIATELY:  Following lower endoscopy (colonoscopy or flexible sigmoidoscopy):  Excessive amounts of blood in the stool  Significant tenderness or worsening of abdominal pains  Swelling of the abdomen that is new, acute  Fever of 100F or higher  Following upper endoscopy (EGD)  Vomiting of blood or coffee ground material  New chest pain or pain under the shoulder blades  Painful or persistently difficult swallowing  New shortness of breath  Fever of 100F or higher  Black, tarry-looking stools  For urgent or emergent issues, a gastroenterologist can be reached at any hour by calling [phone]  [phone]. Do not use MyChart messaging for urgent concerns.    DIET:   Drink plenty of fluids but you should avoid alcoholic beverages for 24 hours.  ACTIVITY:  You should plan to take it easy for the rest of today and you should NOT DRIVE or use heavy machinery until tomorrow (because of the sedation medicines used during the test).    FOLLOW UP: Our staff will call the number listed on your records 48-72 hours following your procedure to check on you and address any questions or concerns that you may have regarding the information given to you following your procedure. If we do not reach you, we will leave a message.  We will attempt to reach you two times.  During this call, we will ask if you have developed any symptoms of COVID 19. If you develop any symptoms (ie: fever, flu-like symptoms, shortness of breath, cough etc.) before then, please call [phone].  If you test positive for Covid 19 in the 2 weeks post procedure, please call and report this information to [place].    If any biopsies were taken you will be contacted by phone or by letter within the next 1-3 weeks.  Please call us at [phone] if you have not heard about the biopsies in 3 weeks.    SIGNATURES/CONFIDENTIALITY: You and/or your care partner have signed paperwork which will be entered into your electronic medical record.  These signatures attest to the fact that that the information above on your After Visit Summary  has been reviewed and is understood.  Full responsibility of the confidentiality of this discharge information lies with you and/or your care-partner.

## 2020-11-14 DIAGNOSIS — F3341 Major depressive disorder, recurrent, in partial remission: Secondary | ICD-10-CM | POA: Diagnosis not present

## 2020-11-14 DIAGNOSIS — S52572A Other intraarticular fracture of lower end of left radius, initial encounter for closed fracture: Secondary | ICD-10-CM | POA: Diagnosis not present

## 2020-11-14 DIAGNOSIS — R69 Illness, unspecified: Secondary | ICD-10-CM | POA: Diagnosis not present

## 2020-11-15 ENCOUNTER — Telehealth: Payer: Self-pay

## 2020-11-15 NOTE — Telephone Encounter (Signed)
  Follow up Call-  Call back number 11/11/2020  Post procedure Call Back phone  # [phone]  Permission to leave phone message Yes  Some recent data might be hidden     Patient questions:  Do you have a fever, pain , or abdominal swelling? No. Pain Score  0 *  Have you tolerated food without any problems? Yes.    Have you been able to return to your normal activities? Yes.    Do you have any questions about your discharge instructions: Diet   No. Medications  No. Follow up visit  No.  Do you have questions or concerns about your Care? No.  Actions: * If pain score is 4 or above: No action needed, pain <4.  Have you developed a fever since your procedure? no  2.   Have you had an respiratory symptoms (SOB or cough) since your procedure? no  3.   Have you tested positive for COVID 19 since your procedure no  4.   Have you had any family members/close contacts diagnosed with the COVID 19 since your procedure?  no   If yes to any of these questions please route to Joylene John, RN and Joella Prince, RN

## 2020-11-21 ENCOUNTER — Other Ambulatory Visit: Payer: Self-pay

## 2020-11-21 DIAGNOSIS — K900 Celiac disease: Secondary | ICD-10-CM

## 2020-11-22 DIAGNOSIS — Z1331 Encounter for screening for depression: Secondary | ICD-10-CM | POA: Diagnosis not present

## 2020-11-22 DIAGNOSIS — Z136 Encounter for screening for cardiovascular disorders: Secondary | ICD-10-CM | POA: Diagnosis not present

## 2020-11-22 DIAGNOSIS — Z0000 Encounter for general adult medical examination without abnormal findings: Secondary | ICD-10-CM | POA: Diagnosis not present

## 2020-11-22 DIAGNOSIS — Z139 Encounter for screening, unspecified: Secondary | ICD-10-CM | POA: Diagnosis not present

## 2020-12-01 DIAGNOSIS — E1169 Type 2 diabetes mellitus with other specified complication: Secondary | ICD-10-CM | POA: Diagnosis not present

## 2020-12-05 ENCOUNTER — Other Ambulatory Visit: Payer: Self-pay | Admitting: *Deleted

## 2020-12-05 NOTE — Patient Outreach (Signed)
[facility]) Care Management  12/05/2020  Evalin Shawhan 1960/04/22 [id number]  [place] [facility]) Care Management RN Health Coach Note   12/05/2020 Name:  Connie Garrett MRN:  [id number] DOB:  01/27/1960  Summary: Patient reports that she is doing fairly well. She has begun to take her B/P and blood sugar 2 times a week and states she will start to write her values down in a notebook. Patient continues to work towards making healthier food choices and states she will try to be more active by doing chair exercises 3 times a week.  Recommendations/Changes made from today's visit: Discussed doing chair exercises 3 times a week  Discussed healthier food choices and encouraged patient to continue to decrease Pepsi and junk food Encouraged patient to take her B/P and blood sugar 2 times a week, record the values into a log, and take the log to her PCP appointments  Subjective: Connie Garrett is an 60 y.o. year old female who is a primary patient of Marco Collie, MD. The care management team was consulted for assistance with care management and/or care coordination needs.    RN Health Coach completed Telephone Visit today.   Objective:  Medications Reviewed Today     Reviewed by Michiel Cowboy, RN (Registered Nurse) on 12/05/20 at 1250  Med List Status: <None>   Medication Order Taking? Sig Documenting Provider Last Dose Status Informant  atorvastatin (LIPITOR) 20 MG tablet [id number] Yes Take 20 mg by mouth at bedtime. [provider] Taking Active   buPROPion [facility] SR) 200 MG 12 hr tablet [id number] Yes Take 200 mg by mouth. [provider] Taking Active            Med Note Forde Dandy, MELISSA I   Mon Sep 26, 2020  2:15 PM) D/c 300 mg  dicyclomine (BENTYL) 10 MG capsule [id number] Yes Take 1 capsule (10 mg total) by mouth 3 (three) times daily as needed for spasms (abdominal pain). Noralyn Pick, NP Taking Active   escitalopram  (LEXAPRO) 20 MG tablet [id number] Yes Take 20 mg by mouth daily. [provider] Taking Active Self  metoprolol succinate (TOPROL-XL) 25 MG 24 hr tablet [id number] Yes Take by mouth. [provider] Taking Active            Med Note Laretta Alstrom, Hunner Garcon A   Fri Jul 15, 4237 53:20 PM) duplicate  Multiple Vitamin (MULTIVITAMIN WITH MINERALS) TABS tablet [id number] Yes Take 1 tablet by mouth daily. [provider] Taking Active Self  omeprazole (PRILOSEC) 20 MG capsule [id number] Yes Take 20 mg by mouth daily. [provider] Taking Active Self  tiZANidine (ZANAFLEX) 2 MG tablet [id number] Yes Take 1 tablet (2 mg total) by mouth every 8 (eight) hours as needed for muscle spasms. Grier Mitts, PA-C Taking Active   TRULANCE 3 MG TABS [id number] Yes Take 1 tablet by mouth daily. [provider] Taking Active              SDOH:  (Social Determinants of Health) assessments and interventions performed: SDOH assessments completed today and documented in the Epic system.    Care Plan  Review of patient past medical history, allergies, medications, health status, including review of consultants reports, laboratory and other test data, was performed as part of comprehensive evaluation for care management services.   Care Plan : Hypertension (Adult)  Updates made by Michiel Cowboy, RN since 12/05/2020 12:00 AM     Problem: Hypertension (Hypertension) Resolved 12/05/2020  Priority: Medium     Long-Range Goal: Hypertension Monitored Completed 12/05/2020  Start Date: 07/15/2020  Expected End Date: 07/30/2021  Note:   Resolving due to duplicate goal   Evidence-based guidance:  Promote initial use of ambulatory blood pressure measurements (for 3 days) to rule out "white-coat" effect; identify masked hypertension and presence or absence of nocturnal "dipping" of blood pressure.   Encourage continued use of home blood pressure monitoring and recording in blood  pressure log; include symptoms of hypotension or potential medication side effects in log.  Review blood pressure measurements taken inside and outside of the provider office; establish baseline and monitor trends; compare to target ranges or patient goal.  Share overall cardiovascular risk with patient; encourage changes to lifestyle risk factors, including alcohol consumption, smoking, inadequate exercise, poor dietary habits and stress.   Notes:     Task: Identify and Monitor Blood Pressure Elevation Completed 12/05/2020  Due Date: 07/30/2021  Note:   Care Management Activities:    - blood pressure trends reviewed - depression screen reviewed - home or ambulatory blood pressure monitoring encouraged    Notes:     Problem: Disease Progression (Hypertension) Resolved 12/05/2020  Priority: Medium     Long-Range Goal: Disease Progression Prevented or Minimized Completed 12/05/2020  Start Date: 07/15/2020  Expected End Date: 07/30/2021  Note:   Resolving due to duplicate goal  Evidence-based guidance:  Tailor lifestyle advice to individual; review progress regularly; give frequent encouragement and respond positively to incremental successes.  Assess for and promote awareness of worsening disease or development of comorbidity.  Prepare patient for laboratory and diagnostic exams based on risk and presentation.  Prepare patient for use of pharmacologic therapy that may include diuretic, beta-blocker, beta-blocker/thiazide combination, angiotensin-converting enzyme inhibitor, renin-angiotensin blocker or calcium-channel blocker.  Expect periodic adjustments to pharmacologic therapy; manage side effects.  Promote a healthy diet that includes primarily plant-based foods, such as fruits, vegetables, whole grains, beans and legumes, low-fat dairy and lean meats.   Consider moderate reduction in sodium intake by avoiding the addition of salt to prepared foods and limiting processed meats, canned  soup, frozen meals and salty snacks.   Promote a regular, daily exercise goal of 150 minutes per week of moderate exercise based on tolerance, ability and patient choice; consider referral to physical therapist, community wellness and/or activity program.  Encourage the avoidance of no more than 2 hours per day of sedentary activity, such as recreational screen time.  Review sources of stress; explore current coping strategies and encourage use of mindfulness, yoga, meditation or exercise to manage stress.   Notes:     Task: Alleviate Barriers to Hypertension Treatment Completed 12/05/2020  Due Date: 07/30/2021  Note:   Care Management Activities:    - healthy diet promoted - medical nutrition therapy provided - pain assessed and managed - reduction of dietary sodium encouraged - reduction in sedentary activities encouraged    Notes:     Care Plan : Diabetes Type 2 (Adult)  Updates made by Michiel Cowboy, RN since 12/05/2020 12:00 AM     Problem: Glycemic Management (Diabetes, Type 2) Resolved 12/05/2020  Priority: Medium     Long-Range Goal: Glycemic Management Optimized Completed 12/05/2020  Start Date: 09/28/2020  Expected End Date: 09/30/2021  Note:   Resolving due to duplicate goal  Evidence-based guidance:  Anticipate A1C testing (point-of-care) every 3 to 6 months based on goal attainment.  Review mutually-set A1C goal or target range.  Anticipate use of antihyperglycemic with  or without insulin and periodic adjustments; consider active involvement of pharmacist.  Provide medical nutrition therapy and development of individualized eating.  Compare self-reported symptoms of hypo or hyperglycemia to blood glucose levels, diet and fluid intake, current medications, psychosocial and physiologic stressors, change in activity and barriers to care adherence.  Promote self-monitoring of blood glucose levels.  Assess and address barriers to management plan, such as food insecurity,  age, developmental ability, depression, anxiety, fear of hypoglycemia or weight gain, as well as medication cost, side effects and complicated regimen.  Consider referral to community-based diabetes education program, visiting nurse, community health worker or health coach.  Encourage regular dental care for treatment of periodontal disease; refer to dental provider when needed.   Notes:     Task: Alleviate Barriers to Glycemic Management Completed 12/05/2020  Due Date: 09/30/2021  Note:   Care Management Activities:    - blood glucose monitoring encouraged - individualized medical nutrition therapy provided - use of blood glucose monitoring log promoted    Notes:     Care Plan : [place] of Care  Updates made by Michiel Cowboy, RN since 12/05/2020 12:00 AM     Problem: Knowledge Deficit Related to Hypertension and Diabetes   Priority: High     Long-Range Goal: Development of Plan of Care for Management of Hypertension and Diabetes   Start Date: 12/05/2020  Expected End Date: 12/30/2021  Note:   Current Barriers:  Knowledge Deficits related to plan of care for management of HTN and DMII  Literacy barriers  RNCM Clinical Goal(s):  Patient will verbalize understanding of plan for management of HTN and DMII as evidenced by patient taking her B/P and blood sugar 2 times a week and recording the values; adhering to a low sodium and diabetic diet take all medications exactly as prescribed and will call provider for medication related questions as evidenced by patient's verbalization that she is taking her prescribed medications as written and she contacts her providers as needed continue to work with [place] to address care management and care coordination needs related to  HTN and DMII as evidenced by adherence to CM Team Scheduled appointments through collaboration with RN Care manager, provider, and care team.  Continues to get assistance with reading and filling out  forms from her friend Vicente Males and/or Psychologist, counselling for questions and assistance  Interventions: Inter-disciplinary care team collaboration (see longitudinal plan of care) Evaluation of current treatment plan related to  self management and patient's adherence to plan as established by provider Discussed doing chair exercises 3 times a week  Discussed healthier food choices and encouraged patient to continue to decrease Pepsi and junk food    Diabetes Interventions:  (Status:  Goal on track:  Yes.) Long Term Goal Assessed patient's understanding of A1c goal: <6.5% Provided education to patient about basic DM disease process Discussed plans with patient for ongoing care management follow up and provided patient with direct contact information for care management team Advised patient, providing education and rationale, to check cbg 2 times a week and record, calling PCP for findings outside established parameters Encouraged maintaining a low sugar and low carbohydrate diet Encouraged increasing physical activity by doing chair exercises 3 times a week Provided information about Senior exercise program on [employer] and advised to google chair exercises for seniors No results found for: HGBA1C  Hypertension Interventions:  (Status:  Goal on track:  Yes.) Long Term Goal Last practice recorded BP readings:  BP Readings from Last 3 Encounters:  11/11/20 (!) 146/65  09/28/20 140/74  07/19/18 (!) 100/48  Most recent eGFR/CrCl: No results found for: EGFR  No components found for: CRCL  Evaluation of current treatment plan related to hypertension self management and patient's adherence to plan as established by provider Reviewed medications with patient and discussed importance of compliance Discussed complications of poorly controlled blood pressure such as heart disease, stroke, circulatory complications, vision complications, kidney impairment, sexual dysfunction Discussed importance of  monitoring B/P 2 times a week and recording the values Encouraged patient to eat a low sodium diet Encouraged patient to do chair exercises 3 time a week  Patient Goals/Self-Care Activities: Take all medications as prescribed Attend all scheduled provider appointments Call pharmacy for medication refills 3-7 days in advance of running out of medications Call provider office for new concerns or questions  check blood sugar at prescribed times: at least 2 times a week check feet daily for cuts, sores or redness enter blood sugar readings and medication or insulin into daily log take the blood sugar log to all doctor visits set goal weight trim toenails straight across fill half of plate with vegetables limit fast food meals to no more than 1 per week manage portion size wear comfortable, well-fitting shoes write blood pressure results in a log or diary take blood pressure log to all doctor appointments take medications for blood pressure exactly as prescribed begin an exercise program eat more whole grains, fruits and vegetables, lean meats and healthy fats Take your blood pressure 2 times a week Do chair exercises 3 times a week Continue to decrease the amount of Pepsi, candy, chips, and junk food you eat Drink 2 bottles of flavored water each day and try to eat fruit and vegetables 2-3 times a day (GTN) [employer] has daily exercise program for seniors daily at 1:00/ can Loss adjuster, chartered exercises to see videos Continue yearly eye exams   Follow Up Plan:  Telephone follow up appointment with care management team member scheduled for:  February       Plan: Telephone follow up appointment with care management team member scheduled for:  February. Nurse will send PCP today's assessment note.  Emelia Loron RN, [place] [phone] [email or web address]

## 2020-12-05 NOTE — Patient Instructions (Signed)
Visit Information  Thank you for taking time to visit with me today. Please don't hesitate to contact me if I can be of assistance to you before our next scheduled telephone appointment.  Following are the goals we discussed today:  Patient Goals/Self-Care Activities: Take all medications as prescribed Attend all scheduled provider appointments Call pharmacy for medication refills 3-7 days in advance of running out of medications Call provider office for new concerns or questions  check blood sugar at prescribed times: at least 2 times a week check feet daily for cuts, sores or redness Continue yearly eye exams enter blood sugar readings and medication or insulin into daily log take the blood sugar log to all doctor visits set goal weight trim toenails straight across fill half of plate with vegetables limit fast food meals to no more than 1 per week manage portion size wear comfortable, well-fitting shoes write blood pressure results in a log or diary take blood pressure log to all doctor appointments take medications for blood pressure exactly as prescribed begin an exercise program eat more whole grains, fruits and vegetables, lean meats and healthy fats Take your blood pressure 2 times a week Do chair exercises 3 times a week Continue to decrease the amount of Pepsi, candy, chips, and junk food you eat Drink 2 bottles of flavored water each day and try to eat fruit and vegetables 2-3 times a day (GTN) [employer] has daily exercise program for seniors daily at 1:00/ can Loss adjuster, chartered exercises to see videos  The patient verbalized understanding of instructions, educational materials, and care plan provided today and agreed to receive a mailed copy of patient instructions, Scientist, clinical (histocompatibility and immunogenetics), and care plan.   Telephone follow up appointment with care management team member scheduled BVP:[email or web address]  Emelia Loron RN, [place] [phone] [email or web address]

## 2020-12-06 ENCOUNTER — Ambulatory Visit: Payer: Self-pay | Admitting: *Deleted

## 2020-12-07 ENCOUNTER — Other Ambulatory Visit: Payer: Medicare HMO

## 2020-12-07 DIAGNOSIS — N390 Urinary tract infection, site not specified: Secondary | ICD-10-CM | POA: Diagnosis not present

## 2020-12-07 DIAGNOSIS — J101 Influenza due to other identified influenza virus with other respiratory manifestations: Secondary | ICD-10-CM | POA: Diagnosis not present

## 2020-12-07 DIAGNOSIS — K900 Celiac disease: Secondary | ICD-10-CM

## 2020-12-07 DIAGNOSIS — H8193 Unspecified disorder of vestibular function, bilateral: Secondary | ICD-10-CM | POA: Diagnosis not present

## 2020-12-07 DIAGNOSIS — H6500 Acute serous otitis media, unspecified ear: Secondary | ICD-10-CM | POA: Diagnosis not present

## 2020-12-07 IMAGING — CR CHEST - 2 VIEW
2 series · 2 of 2 positions shown · non-contrast
Comparison: 02/10/2018

CLINICAL DATA: Preoperative evaluation for upcoming knee
replacement

EXAM:
CHEST - 2 VIEW

[w chest pa]
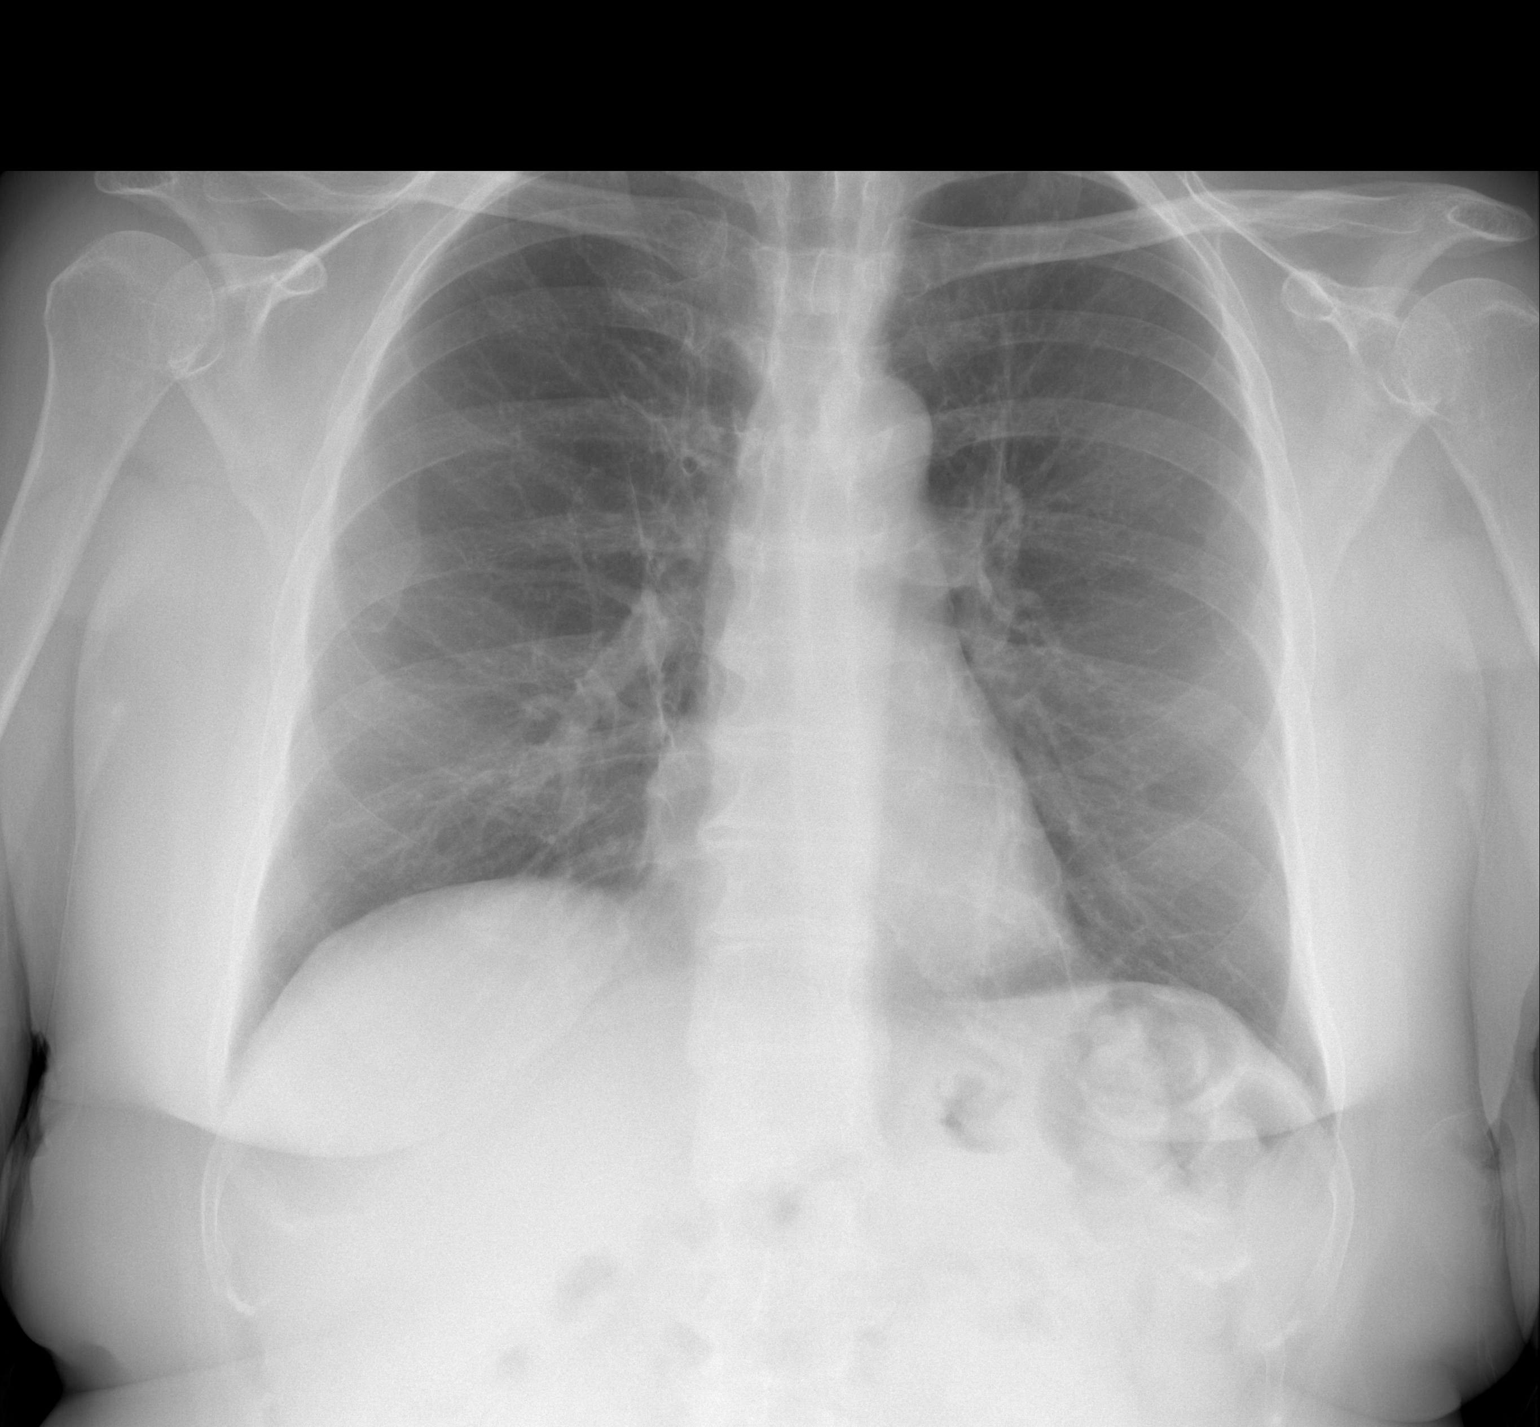

[w chest lat]
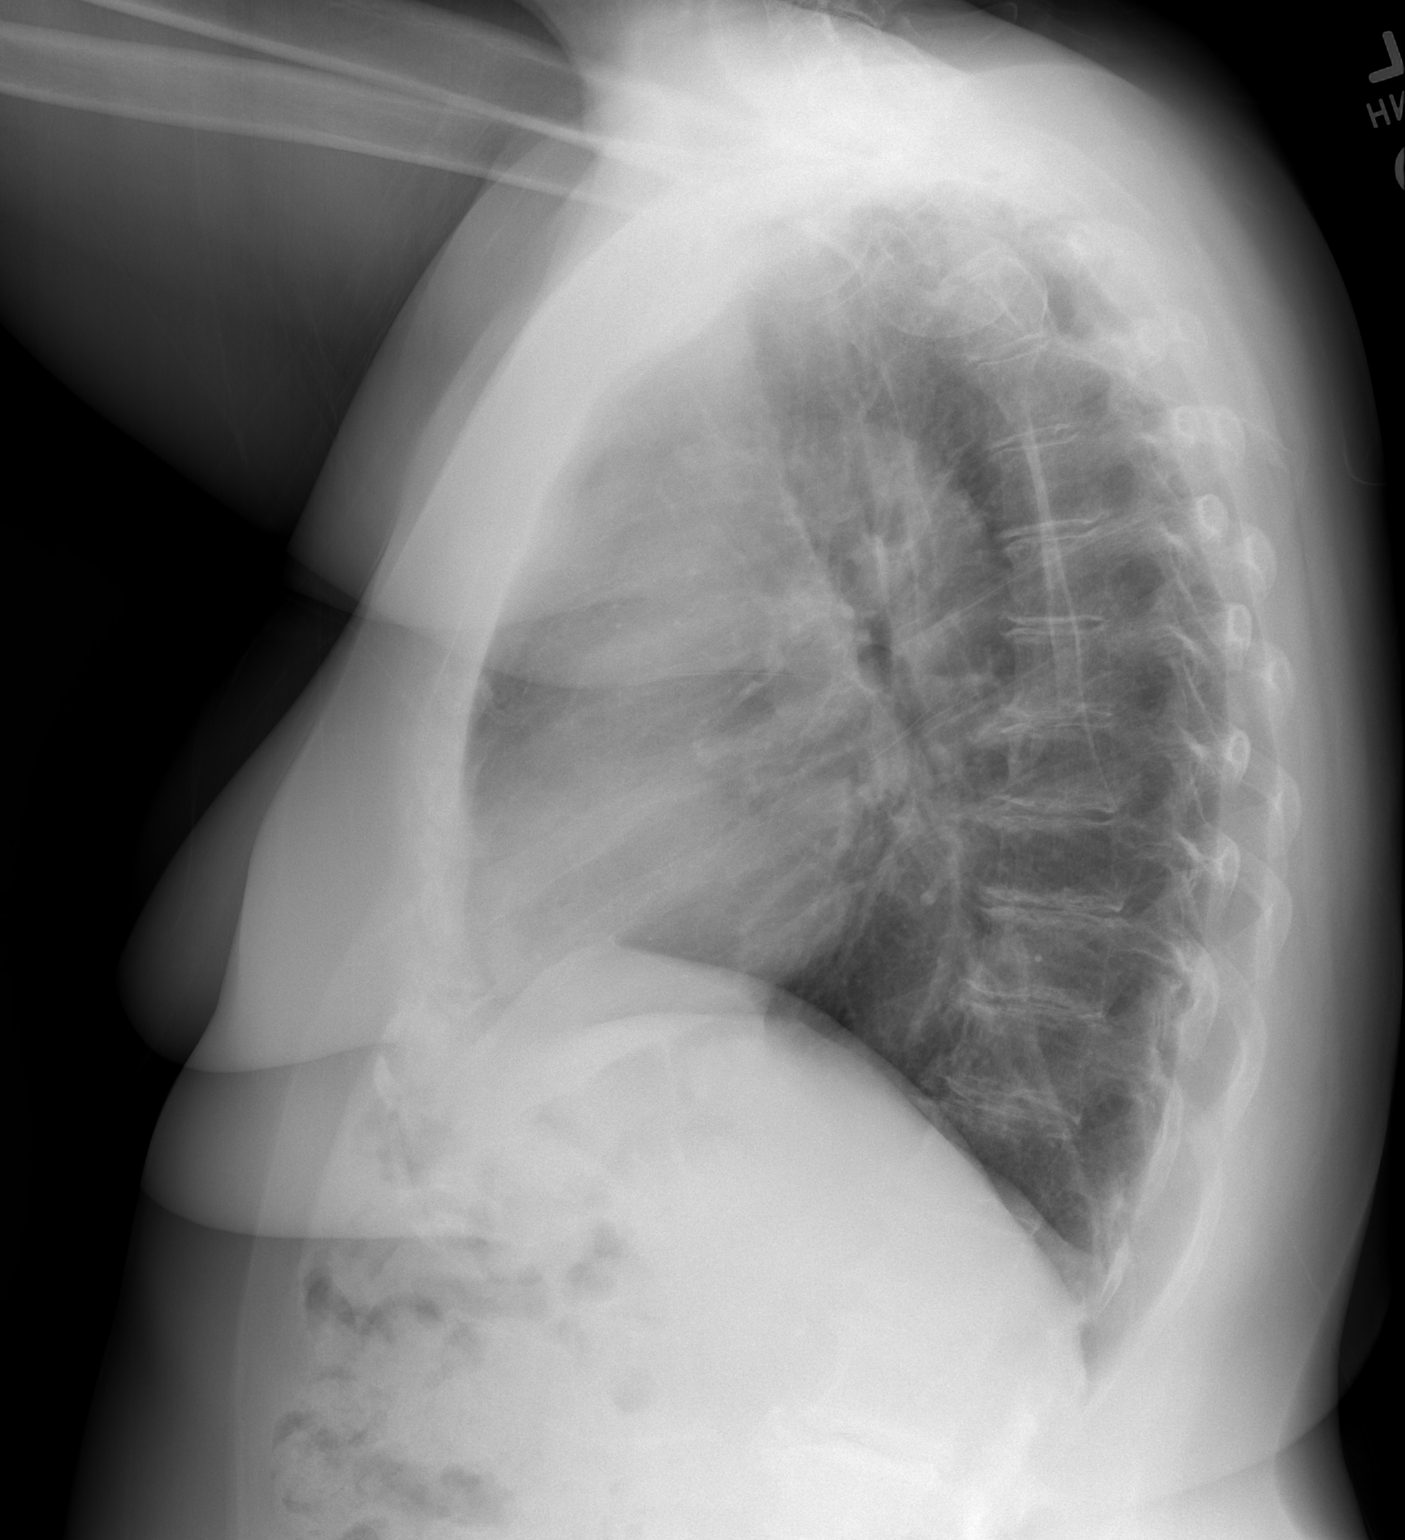

[2 of 2 positions shown; findings below may reference images not displayed]

FINDINGS: The heart size and mediastinal contours are within normal limits.
Both lungs are clear. The visualized skeletal structures show
degenerative changes of the thoracic spine.
IMPRESSION: No active cardiopulmonary disease.

## 2020-12-07 NOTE — Addendum Note (Signed)
Addended by: Susy Manor on: 67/02/5498 04:39 PM   Modules accepted: Orders

## 2020-12-08 LAB — TISSUE TRANSGLUTAMINASE ABS,IGG,IGA
(tTG) Ab, IgA: 2.9 U/mL
(tTG) Ab, IgG: 1 U/mL

## 2020-12-08 LAB — IGA: Immunoglobulin A: 296 mg/dL (ref 47–310)

## 2020-12-15 ENCOUNTER — Telehealth: Payer: Self-pay | Admitting: Gastroenterology

## 2020-12-15 NOTE — Telephone Encounter (Signed)
Called and spoke with patient. We have reviewed her lab results from 12/07/20 and recommendations. Pt is aware of her follow up appt with Dr. Havery Moros on 01/12/21 at 8:10 am. Pt verbalized understanding and had no concerns at the end of the call.

## 2020-12-15 NOTE — Telephone Encounter (Signed)
Inbound call from patient stating she has additional questions in regards to her lab results and would like to speak with a nurse.  Please advise.

## 2020-12-18 DIAGNOSIS — G4733 Obstructive sleep apnea (adult) (pediatric): Secondary | ICD-10-CM | POA: Diagnosis not present

## 2020-12-28 DIAGNOSIS — R5383 Other fatigue: Secondary | ICD-10-CM | POA: Diagnosis not present

## 2020-12-28 DIAGNOSIS — E668 Other obesity: Secondary | ICD-10-CM | POA: Diagnosis not present

## 2020-12-28 DIAGNOSIS — G4733 Obstructive sleep apnea (adult) (pediatric): Secondary | ICD-10-CM | POA: Diagnosis not present

## 2020-12-28 DIAGNOSIS — R400 Somnolence: Secondary | ICD-10-CM | POA: Diagnosis not present

## 2020-12-28 DIAGNOSIS — R0600 Dyspnea, unspecified: Secondary | ICD-10-CM | POA: Diagnosis not present

## 2020-12-29 DIAGNOSIS — G4733 Obstructive sleep apnea (adult) (pediatric): Secondary | ICD-10-CM | POA: Diagnosis not present

## 2021-01-06 DIAGNOSIS — R0600 Dyspnea, unspecified: Secondary | ICD-10-CM | POA: Diagnosis not present

## 2021-01-06 DIAGNOSIS — G4733 Obstructive sleep apnea (adult) (pediatric): Secondary | ICD-10-CM | POA: Diagnosis not present

## 2021-01-06 DIAGNOSIS — E668 Other obesity: Secondary | ICD-10-CM | POA: Diagnosis not present

## 2021-01-06 DIAGNOSIS — R5383 Other fatigue: Secondary | ICD-10-CM | POA: Diagnosis not present

## 2021-01-06 DIAGNOSIS — R400 Somnolence: Secondary | ICD-10-CM | POA: Diagnosis not present

## 2021-01-12 ENCOUNTER — Ambulatory Visit (INDEPENDENT_AMBULATORY_CARE_PROVIDER_SITE_OTHER): Payer: Medicare HMO | Admitting: Gastroenterology

## 2021-01-12 ENCOUNTER — Other Ambulatory Visit (INDEPENDENT_AMBULATORY_CARE_PROVIDER_SITE_OTHER): Payer: Medicare HMO

## 2021-01-12 ENCOUNTER — Encounter: Payer: Self-pay | Admitting: Gastroenterology

## 2021-01-12 VITALS — BP 140/78 | HR 74 | Ht 61.0 in | Wt 225.0 lb

## 2021-01-12 DIAGNOSIS — R1310 Dysphagia, unspecified: Secondary | ICD-10-CM | POA: Diagnosis not present

## 2021-01-12 DIAGNOSIS — Q438 Other specified congenital malformations of intestine: Secondary | ICD-10-CM

## 2021-01-12 DIAGNOSIS — Z86010 Personal history of colonic polyps: Secondary | ICD-10-CM | POA: Diagnosis not present

## 2021-01-12 DIAGNOSIS — Z23 Encounter for immunization: Secondary | ICD-10-CM | POA: Diagnosis not present

## 2021-01-12 DIAGNOSIS — K319 Disease of stomach and duodenum, unspecified: Secondary | ICD-10-CM | POA: Diagnosis not present

## 2021-01-12 DIAGNOSIS — K900 Celiac disease: Secondary | ICD-10-CM | POA: Diagnosis not present

## 2021-01-12 DIAGNOSIS — K5909 Other constipation: Secondary | ICD-10-CM | POA: Diagnosis not present

## 2021-01-12 DIAGNOSIS — R933 Abnormal findings on diagnostic imaging of other parts of digestive tract: Secondary | ICD-10-CM

## 2021-01-12 DIAGNOSIS — G4733 Obstructive sleep apnea (adult) (pediatric): Secondary | ICD-10-CM | POA: Diagnosis not present

## 2021-01-12 LAB — VITAMIN D 25 HYDROXY (VIT D DEFICIENCY, FRACTURES): VITD: 74.36 ng/mL (ref 30.00–100.00)

## 2021-01-12 LAB — TSH: TSH: 4.2 u[IU]/mL (ref 0.35–5.50)

## 2021-01-12 LAB — VITAMIN B12: Vitamin B-12: 358 pg/mL (ref 211–911)

## 2021-01-12 MED ORDER — LINACLOTIDE 290 MCG PO CAPS: 290.0000 ug | ORAL_CAPSULE | Freq: Every day | ORAL | 0 refills | Status: DC

## 2021-01-12 NOTE — Patient Instructions (Addendum)
If you are age 59 or older, your body mass index should be between 23-30. Your Body mass index is 42.51 kg/m. If this is out of the aforementioned range listed, please consider follow up with your Primary Care Provider.  If you are age 27 or younger, your body mass index should be between 19-25. Your Body mass index is 42.51 kg/m. If this is out of the aformentioned range listed, please consider follow up with your Primary Care Provider.   ________________________________________________________  The [facility] providers would like to encourage you to use [facility] to communicate with providers for non-urgent requests or questions.  Due to long hold times on the telephone, sending your provider a message by [facility] may be a faster and more efficient way to get a response.  Please allow 48 business hours for a response.  Please remember that this is for non-urgent requests.  _______________________________________________________  Please go to the lab in the basement of our building to have lab work done as you leave today. Hit "B" for basement when you get on the elevator.  When the doors open the lab is on your left.  We will call you with the results. Thank you.  Please go to the basement of our building and go to Radiology for a Bone Density scan.  We are giving you a Prevnar 13 Pneumococcal vaccine today.  We have given you samples of the following medication to take: Linzess 290 mcg: Take once daily before breakfast    Thank you for entrusting me with your care and for choosing [facility], Dr. Holton Cellar

## 2021-01-12 NOTE — Progress Notes (Signed)
HPI :  61 year old female here for follow-up visit for dysphagia, constipation, abnormal small intestine on EGD.  She was last seen in our office in September where she established with Carl Best, and saw me for endoscopy and colonoscopy in November.  Recall she has a history of constipation, at baseline has bowel movement once every 3 to 4 days.  She has been on MiraLAX in the past without much improvement.  She tried Amitiza in the past without much benefit.  She has been taking Trulance most recently and states she takes it every day but still feels like it is a struggle to get stool out and has not feeling completely evacuated, inquires about other options.  No blood in her stool.  She does have some abdominal bloating with her constipation.  Her colonoscopy with me in November revealed 12 polyps, 11 of them were adenomas, she was told to repeat a colonoscopy in 1 year for surveillance.  She denies any family history of colon cancer.  She also has a history of GERD and dysphagia that was evaluated at her last visit.  Her dysphagia localized to her throat area and food has been getting stuck there for the past few years.  GERD has been controlled with omeprazole 20 mg daily.  EGD in November did not show any overt stenosis or stricture but was empirically dilated with a savory dilator to 18 mm and mucosal rent was noted just inferior to the UES, thought to be site of subtle stricture.  She states the dilation definitely helped her dysphagia perhaps by 50%, but it did not resolve it.  She does have this occasionally to solids and pills to but no liquids.  States it is mild at this time and not nearly as bothersome as before.  Incidentally noted she had some flattening of her duodenal mucosa that was concerning for underlying celiac disease.  Biopsies were taken and pathology suggested findings grossly consistent with celiac disease, marsh type IIIb.  We did follow-up TTGIgA and IgA levels  as well as TTG IgG, and both were negative.  She does have chronic bloating she thinks from her constipation, but certainly no diarrhea.  No family history noted of celiac disease.  She has not started a gluten-free diet.  She has never been screened for osteoporosis or osteopenia.  No recent anemia.  She is never had a pneumococcal vaccine.  She denies any routine use of NSAIDs.     Endoscopic evaluation: EGD 11/11/20 -  - The exam of the esophagus was otherwise normal. No focal stenosis / stricture noted. - A guidewire was placed and the scope was withdrawn. Empiric dilation was performed in the entire esophagus with a Savary dilator with mild resistance at 17 mm (no heme) and then with 18 mm (heme noted). Relook endoscopy showed appropriate mucosal wrent just inferior to the UES at site of subtle stenosis which was not appreciated endoscopically. - The entire examined stomach was normal. - Flattening and mild edema was found in the duodenal bulb and in the second portion of the duodenum. Biopsies for histology were taken with a cold forceps for evaluation of celiac disease. - The exam of the duodenum was otherwise normal.  Colonoscopy 11/11/20 The perianal and digital rectal examinations were normal. - Two sessile polyps were found in the cecum. The polyps were 3 mm in size. These polyps were removed with a cold snare. Resection and retrieval were complete. - Two sessile polyps were found in  the ileocecal valve. The polyps were 3 to 10 mm in size. These polyps were removed with a cold snare. Resection and retrieval were complete. - Three sessile polyps were found in the ascending colon. The polyps were 3 to 4 mm in size. These polyps were removed with a cold snare. Resection and retrieval were complete. - Three sessile polyps were found in the hepatic flexure. The polyps were 3 to 5 mm in size. These polyps were removed with a cold snare. Resection and retrieval were complete. - A 4 mm  polyp was found in the transverse colon. The polyp was sessile. The polyp was removed with a cold snare. Resection and retrieval were complete. - A 5 mm polyp was found in the sigmoid colon. The polyp was sessile. The polyp was removed with a cold snare. Resection and retrieval were complete. - Multiple small-mouthed diverticula were found in the sigmoid colon. - Internal hemorrhoids were found during retroflexion. - A large amount of liquid stool was found in the entire colon, making visualization difficult. Lavage of the colon was performed using copious amounts of sterile water, resulting in clearance with adequate visualization. This process took several minutes. - The exam was otherwise without abnormality.  1. Surgical [P], duodenal bxs - DUODENAL MUCOSA WITH VILLOUS ATROPHY AND INCREASED INTRAEPITHELIAL LYMPHOCYTES CONSISTENT WITH CELIAC SPRUE IN THE PROPER CLINICAL CONTEXT (MARSH TYPE 3B). 2. Surgical [P], colon, sigmoid x1, polyp (1) - HYPERPLASTIC POLYP. 3. Surgical [P], colon, transverse x1, hepatic flexure x2, cecal x2, ileocecal valve x2, ascending x3, polyp (10) - TUBULAR ADENOMA(S) WITHOUT HIGH GRADE DYSPLASIA.  TTG IgG 1.0 (normal) TTG Iga 2.9 (normal)   Past Medical History:  Diagnosis Date   Arthritis    Colon polyps    Depression    GERD (gastroesophageal reflux disease)    Hypertension      Past Surgical History:  Procedure Laterality Date   ABDOMINAL HYSTERECTOMY     BACK SURGERY  1990   colonosocpy     ESOPHAGOGASTRODUODENOSCOPY     EYE SURGERY     TOTAL KNEE ARTHROPLASTY Right 07/18/2018   Procedure: TOTAL KNEE ARTHROPLASTY;  Surgeon: Dorna Leitz, MD;  Location: [facility];  Service: Orthopedics;  Laterality: Right;   WRIST FRACTURE SURGERY Left    x 2   Family History  Problem Relation Age of Onset   Diabetes Mother    Colon cancer Neg Hx    Colon polyps Neg Hx    Rectal cancer Neg Hx    Esophageal cancer Neg Hx    Stomach cancer Neg Hx     Social History   Tobacco Use   Smoking status: Never   Smokeless tobacco: Never  Vaping Use   Vaping Use: Never used  Substance Use Topics   Alcohol use: Never   Drug use: Not Currently   Current Outpatient Medications  Medication Sig Dispense Refill   atorvastatin (LIPITOR) 20 MG tablet Take 20 mg by mouth at bedtime.     buPROPion (WELLBUTRIN SR) 200 MG 12 hr tablet Take 200 mg by mouth.     dicyclomine (BENTYL) 10 MG capsule Take 1 capsule (10 mg total) by mouth 3 (three) times daily as needed for spasms (abdominal pain). 30 capsule 1   escitalopram (LEXAPRO) 20 MG tablet Take 20 mg by mouth daily.     linaclotide (LINZESS) 290 MCG CAPS capsule Take 1 capsule (290 mcg total) by mouth daily before breakfast. LOT: [id number], Exp: 07-2021 12 capsule 0   metoprolol  succinate (TOPROL-XL) 25 MG 24 hr tablet Take by mouth.     Multiple Vitamin (MULTIVITAMIN WITH MINERALS) TABS tablet Take 1 tablet by mouth daily.     omeprazole (PRILOSEC) 20 MG capsule Take 20 mg by mouth daily.     tiZANidine (ZANAFLEX) 2 MG tablet Take 1 tablet (2 mg total) by mouth every 8 (eight) hours as needed for muscle spasms. 40 tablet 0   TRULANCE 3 MG TABS Take 1 tablet by mouth daily.     No current facility-administered medications for this visit.   Allergies  Allergen Reactions   Shrimp Extract Allergy Skin Test      Review of Systems: All systems reviewed and negative except where noted in HPI.    Lab Results  Component Value Date   WBC 9.0 09/28/2020   HGB 12.7 09/28/2020   HCT 37.8 09/28/2020   MCV 85.8 09/28/2020   PLT 260.0 09/28/2020    Lab Results  Component Value Date   CREATININE 0.84 09/28/2020   BUN 10 09/28/2020   NA 138 09/28/2020   K 3.6 09/28/2020   CL 100 09/28/2020   CO2 29 09/28/2020    Lab Results  Component Value Date   ALT 12 09/28/2020   AST 12 09/28/2020   ALKPHOS 111 09/28/2020   BILITOT 0.7 09/28/2020     Physical Exam: BP 140/78    Pulse 74    Ht 5'  1" (1.549 m)    Wt 225 lb (102.1 kg)    BMI 42.51 kg/m  Constitutional: Pleasant,well-developed, female in no acute distress. Neurological: Alert and oriented to person place and time. Psychiatric: Normal mood and affect. Behavior is normal.   ASSESSMENT AND PLAN: 61 year old female here for reassessment of the following:  Abnormal duodenal mucosa - suggestive of celiac disease Dysphagia Chronic constipation History of colon polyps  Endoscopic findings were grossly consistent with celiac disease as were pathology however her tissue transglutaminase antibody is negative, so unclear if she truly has celiac disease or not.  She certainly does not have any chronic diarrhea or other manifestations of celiac disease.  We discussed options moving forward.  I am going to check her anti-Endo mesial antibodies as well as HLA genetic testing to see if this will help clarify if she truly has celiac or not causing these findings.  I counseled her that if the genetic testing is negative then we need to evaluate for other causes of her mucosal abnormality.  I will screen her for vitamin deficiencies as well today in light of her small bowel findings, and also refer for a DEXA scan to rule out osteopenia/osteoporosis.  Given suspected celiac disease she was also given a pneumococcal vaccine given relative hyposplenism that can be seen with celiac disease.  Will await her labs with further recommendations moving forward in regards to these findings, will hold off on gluten free diet until her labs are back.  Otherwise her dysphagia is improved but not resolved.  If this really bothers her we can pursue a barium study to assess for gross dysmotility and assess if stricturing is persistent.  Given symptoms are mild and improved since the endoscopy, we will hold off on this for now, if symptoms recur or worsen she will contact me, reassured her no concerning pathology.  On Trulance for chronic constipation which is  helping but will give her some samples of Linzess to 90 mcg daily to see if that will work any better than Trulance.  If she  would prefer this we can prescribe it.  Plan: - labs today - anti endomesial antibodies, HLA celiac genetic testing, TIBC / ferritin, B12, vitamin D, TSH - refer for DEXA scan - pneumococcal vaccine today - monitor dysphagia, consider barium study pending course but she wants to hold off for now - samples of Linzess 279mg / day provided, if works better than Trulance she will notify [place] prescription  SJolly Mango MD [facility]

## 2021-01-13 ENCOUNTER — Ambulatory Visit (INDEPENDENT_AMBULATORY_CARE_PROVIDER_SITE_OTHER)
Admission: RE | Admit: 2021-01-13 | Discharge: 2021-01-13 | Disposition: A | Discharge status: Home / Self Care | Payer: Medicare HMO | Source: Ambulatory Visit | Attending: Gastroenterology | Admitting: Gastroenterology

## 2021-01-13 ENCOUNTER — Other Ambulatory Visit: Payer: Self-pay

## 2021-01-13 DIAGNOSIS — Q438 Other specified congenital malformations of intestine: Secondary | ICD-10-CM | POA: Diagnosis not present

## 2021-01-13 DIAGNOSIS — R933 Abnormal findings on diagnostic imaging of other parts of digestive tract: Secondary | ICD-10-CM

## 2021-01-13 DIAGNOSIS — M810 Age-related osteoporosis without current pathological fracture: Secondary | ICD-10-CM

## 2021-01-13 LAB — IRON,TIBC AND FERRITIN PANEL
%SAT: 14 % (calc) — ABNORMAL LOW (ref 16–45)
Ferritin: 47 ng/mL (ref 16–232)
Iron: 41 ug/dL — ABNORMAL LOW (ref 45–160)
TIBC: 286 mcg/dL (calc) (ref 250–450)

## 2021-01-13 LAB — SPECIMEN STATUS REPORT

## 2021-01-16 ENCOUNTER — Telehealth: Payer: Self-pay | Admitting: Gastroenterology

## 2021-01-16 DIAGNOSIS — R0600 Dyspnea, unspecified: Secondary | ICD-10-CM | POA: Diagnosis not present

## 2021-01-16 DIAGNOSIS — G4733 Obstructive sleep apnea (adult) (pediatric): Secondary | ICD-10-CM | POA: Diagnosis not present

## 2021-01-16 NOTE — Telephone Encounter (Signed)
See result note.  

## 2021-01-16 NOTE — Telephone Encounter (Signed)
Patient returned your call, requesting a call back. Please advise.

## 2021-01-17 ENCOUNTER — Telehealth: Payer: Self-pay | Admitting: Gastroenterology

## 2021-01-17 NOTE — Telephone Encounter (Signed)
Lm on pt's and [name]'s cell phone for patient to return call. [name]'s phone number was the listed number to return call to.

## 2021-01-17 NOTE — Telephone Encounter (Signed)
Okay. Well, sounds like it has been effective for her, but too strong. We can give her samples of Linzess 149mcg / day to see if that would work better, or go back to her Trulance dosing (but that did not work as well). She can stop the Linzess 222mcg / day in the interim if too strong. thanks

## 2021-01-17 NOTE — Telephone Encounter (Signed)
Pt returned call. She states that she started Linzess 290 mcg at the time of her last office visit and she has been having liquid diarrhea ever since. Pt reports that she is taking 1 capsule 30 minutes prior to breakfast. She reports having at least 4 large volume episodes of liquid diarrhea daily. She also reports bloating and feeling full. She denies any abdominal pain or blood in the stool. Not taking any Imodium, she is still taking the Linzess at this time. I told her that we may have to adjust her dose but will check with Dr. Havery Moros. Please advise, thanks.

## 2021-01-17 NOTE — Telephone Encounter (Signed)
Inbound call from patient states she is experiencing liquid diarrhea, bloating, and full feeling

## 2021-01-18 MED ORDER — LINACLOTIDE 145 MCG PO CAPS: 145.0000 ug | ORAL_CAPSULE | Freq: Every day | ORAL | 0 refills | Status: DC

## 2021-01-18 NOTE — Addendum Note (Signed)
Addended by: Yevette Edwards on: 01/18/2021 08:39 AM   Modules accepted: Orders

## 2021-01-18 NOTE — Telephone Encounter (Signed)
Called and spoke with patient in regards to recommendations. She would like to try the lower dose of Linzess. Pt knows that I have placed samples at the 2nd floor receptionist desk for her to pick up. She knows that she can discontinue Linzess 290 mcg at this time. Pt will try Linzess 145 mcg daily for a week, she will let us know if this works better. If it does work then we will send in a prescription. Pt verbalized understanding and had no concerns at the end of the call.

## 2021-01-23 DIAGNOSIS — F3341 Major depressive disorder, recurrent, in partial remission: Secondary | ICD-10-CM | POA: Diagnosis not present

## 2021-01-23 DIAGNOSIS — R69 Illness, unspecified: Secondary | ICD-10-CM | POA: Diagnosis not present

## 2021-01-26 ENCOUNTER — Telehealth: Payer: Self-pay | Admitting: Gastroenterology

## 2021-01-26 LAB — CELIAC DISEASE HLA DQ ASSOC.
DQ2 (DQA1 0501/0505,DQB1 02XX): POSITIVE
DQ8 (DQA1 03XX, DQB1 0302): NEGATIVE

## 2021-01-26 LAB — IGA, IGG ENDOMYSIAL ANTIBODIES

## 2021-01-26 NOTE — Telephone Encounter (Signed)
Called and spoke with patient. I told her that she received the results at the same time as Dr. Havery Moros and he has not had a chance to review yet. She is aware that he is out of the office for the remainder of the week but will return next week. I told her to keep an eye out for a my chart message with her results or a phone call from Dr. Havery Moros. Pt verbalized understanding and had no concerns at the end of the call.

## 2021-01-26 NOTE — Telephone Encounter (Signed)
Patient & her daughter called requesting the results of the recent bloodwork she had done.  They got the results in MyChart, but they said it was all very confusing as one said she was positive and one said she was negative.  Please call to give patient clarification on these results.  Thank you.

## 2021-01-27 DIAGNOSIS — E1169 Type 2 diabetes mellitus with other specified complication: Secondary | ICD-10-CM | POA: Diagnosis not present

## 2021-01-30 ENCOUNTER — Other Ambulatory Visit: Payer: Self-pay

## 2021-01-30 DIAGNOSIS — R933 Abnormal findings on diagnostic imaging of other parts of digestive tract: Secondary | ICD-10-CM

## 2021-01-30 DIAGNOSIS — Q438 Other specified congenital malformations of intestine: Secondary | ICD-10-CM

## 2021-01-30 MED ORDER — FERROUS SULFATE 325 (65 FE) MG PO TBEC: 325.0000 mg | DELAYED_RELEASE_TABLET | Freq: Every day | ORAL | 0 refills | Status: DC

## 2021-02-01 ENCOUNTER — Other Ambulatory Visit: Payer: Medicare HMO

## 2021-02-01 DIAGNOSIS — I10 Essential (primary) hypertension: Secondary | ICD-10-CM | POA: Diagnosis not present

## 2021-02-01 DIAGNOSIS — Q438 Other specified congenital malformations of intestine: Secondary | ICD-10-CM

## 2021-02-01 DIAGNOSIS — R933 Abnormal findings on diagnostic imaging of other parts of digestive tract: Secondary | ICD-10-CM | POA: Diagnosis not present

## 2021-02-01 DIAGNOSIS — E1169 Type 2 diabetes mellitus with other specified complication: Secondary | ICD-10-CM | POA: Diagnosis not present

## 2021-02-01 DIAGNOSIS — E785 Hyperlipidemia, unspecified: Secondary | ICD-10-CM | POA: Diagnosis not present

## 2021-02-06 LAB — IGA, IGG ENDOMYSIAL ANTIBODIES
IgA Anti-Endomysial Ab: <2.5 Titer
IgG Anti-Endomysial Ab: <2.5 Titer

## 2021-02-07 DIAGNOSIS — I10 Essential (primary) hypertension: Secondary | ICD-10-CM | POA: Diagnosis not present

## 2021-02-07 DIAGNOSIS — E785 Hyperlipidemia, unspecified: Secondary | ICD-10-CM | POA: Diagnosis not present

## 2021-02-07 DIAGNOSIS — Z135 Encounter for screening for eye and ear disorders: Secondary | ICD-10-CM | POA: Diagnosis not present

## 2021-02-07 DIAGNOSIS — S52572A Other intraarticular fracture of lower end of left radius, initial encounter for closed fracture: Secondary | ICD-10-CM | POA: Diagnosis not present

## 2021-02-07 DIAGNOSIS — E1169 Type 2 diabetes mellitus with other specified complication: Secondary | ICD-10-CM | POA: Diagnosis not present

## 2021-02-08 ENCOUNTER — Telehealth: Payer: Self-pay | Admitting: Gastroenterology

## 2021-02-08 DIAGNOSIS — K900 Celiac disease: Secondary | ICD-10-CM

## 2021-02-08 NOTE — Telephone Encounter (Signed)
I called the patient and reviewed her labs.  Her anti-Endo mesial antibodies are negative, TTG is negative.  Her genetic testing is HLA-DQ2 positive but DQ 8 negative, that profile gives her a 10% chance of celiac disease.  That being said her EGD showed fairly typical changes for celiac disease and her pathology was consistent with that.  She does have a mild iron deficiency.  Screened her for osteoporosis which she does have.  I think is very important to know over time if she truly has celiac disease which I think remains quite possible.  I recommend going on a gluten-free diet, would refer her to nutritionist for this, and then repeat an EGD in 6 months to assess for mucosal healing/improvement.  She is agreeable with this plan.  Connie Garrett can you please refer the patient to a nutritionist and place a recall for office follow-up in 6 months?  Thanks

## 2021-02-08 NOTE — Telephone Encounter (Signed)
69-month office recall in epic.   Ambulatory referral to nutrition in epic.

## 2021-02-08 NOTE — Telephone Encounter (Signed)
Inbound call from patient, requesting results from labs done on 2/1. Please advise.

## 2021-02-10 ENCOUNTER — Telehealth: Payer: Self-pay | Admitting: Gastroenterology

## 2021-02-10 NOTE — Telephone Encounter (Signed)
Patient's cousin called regarding patient.  She has not had a BM since Saturday.  She is in a lot of pain and her stomach is distended.  Her PCP advised her to take 6 Colace as well as 32 ounces of Gatorade with 8 capfuls of Miralax.  She has done that and still hasn't gone to the bathroom.  Please call patient and advise as to what she should do.  Thank you.

## 2021-02-10 NOTE — Telephone Encounter (Signed)
Returned call to patient. She states that her last BM was Saturday. She reports that she passed gas last night but not today. She reports that she took the 6 colace and did the Miralax purge around 9 am this morning. Pt has not passed any gas or stool at all today. Her abdomen is distended, her cousin stated that she looks like she is about 6-7 months pregnant. She feels sick on her stomach, she reports that she has still been taking Linzess 145 mcg daily. I told pt that if she is not passing any stool or gas then she would need to go to the ER for evaluation. I told her that I would check with you also. Pt and her cousin verbalized understanding and had no concerns at the end of the call.

## 2021-02-10 NOTE — Telephone Encounter (Signed)
I called the patient back this evening when I saw this message.  She states since she last spoke with you earlier this afternoon she is passed some loose stools and is starting to feel better.  She is not vomiting, her stomach is feeling better.  Recommend another MiraLAX purge to help clear her out further, she may also benefit from enemas from below in case she has any hard stool that needs to be cleared out.  She will contact us over the weekend if she needs further advice or has any problems.  She agreed with the plan

## 2021-02-12 DIAGNOSIS — G4733 Obstructive sleep apnea (adult) (pediatric): Secondary | ICD-10-CM | POA: Diagnosis not present

## 2021-02-13 DIAGNOSIS — R5383 Other fatigue: Secondary | ICD-10-CM | POA: Diagnosis not present

## 2021-02-13 DIAGNOSIS — R0600 Dyspnea, unspecified: Secondary | ICD-10-CM | POA: Diagnosis not present

## 2021-02-13 DIAGNOSIS — R400 Somnolence: Secondary | ICD-10-CM | POA: Diagnosis not present

## 2021-02-13 DIAGNOSIS — E668 Other obesity: Secondary | ICD-10-CM | POA: Diagnosis not present

## 2021-02-13 DIAGNOSIS — G4733 Obstructive sleep apnea (adult) (pediatric): Secondary | ICD-10-CM | POA: Diagnosis not present

## 2021-02-15 DIAGNOSIS — N179 Acute kidney failure, unspecified: Secondary | ICD-10-CM | POA: Diagnosis not present

## 2021-02-15 DIAGNOSIS — R69 Illness, unspecified: Secondary | ICD-10-CM | POA: Diagnosis not present

## 2021-02-15 DIAGNOSIS — Z6837 Body mass index (BMI) 37.0-37.9, adult: Secondary | ICD-10-CM | POA: Diagnosis not present

## 2021-02-15 DIAGNOSIS — F331 Major depressive disorder, recurrent, moderate: Secondary | ICD-10-CM | POA: Diagnosis not present

## 2021-02-16 DIAGNOSIS — R0600 Dyspnea, unspecified: Secondary | ICD-10-CM | POA: Diagnosis not present

## 2021-02-16 DIAGNOSIS — G4733 Obstructive sleep apnea (adult) (pediatric): Secondary | ICD-10-CM | POA: Diagnosis not present

## 2021-03-01 DIAGNOSIS — E785 Hyperlipidemia, unspecified: Secondary | ICD-10-CM | POA: Diagnosis not present

## 2021-03-01 DIAGNOSIS — I10 Essential (primary) hypertension: Secondary | ICD-10-CM | POA: Diagnosis not present

## 2021-03-01 DIAGNOSIS — E1169 Type 2 diabetes mellitus with other specified complication: Secondary | ICD-10-CM | POA: Diagnosis not present

## 2021-03-01 DIAGNOSIS — R69 Illness, unspecified: Secondary | ICD-10-CM | POA: Diagnosis not present

## 2021-03-01 DIAGNOSIS — F331 Major depressive disorder, recurrent, moderate: Secondary | ICD-10-CM | POA: Diagnosis not present

## 2021-03-01 DIAGNOSIS — Z6836 Body mass index (BMI) 36.0-36.9, adult: Secondary | ICD-10-CM | POA: Diagnosis not present

## 2021-03-02 DIAGNOSIS — M1712 Unilateral primary osteoarthritis, left knee: Secondary | ICD-10-CM | POA: Diagnosis not present

## 2021-03-12 DIAGNOSIS — G4733 Obstructive sleep apnea (adult) (pediatric): Secondary | ICD-10-CM | POA: Diagnosis not present

## 2021-03-15 DIAGNOSIS — I10 Essential (primary) hypertension: Secondary | ICD-10-CM | POA: Diagnosis not present

## 2021-03-15 DIAGNOSIS — Z1331 Encounter for screening for depression: Secondary | ICD-10-CM | POA: Diagnosis not present

## 2021-03-15 DIAGNOSIS — Z6835 Body mass index (BMI) 35.0-35.9, adult: Secondary | ICD-10-CM | POA: Diagnosis not present

## 2021-03-15 DIAGNOSIS — F331 Major depressive disorder, recurrent, moderate: Secondary | ICD-10-CM | POA: Diagnosis not present

## 2021-03-15 DIAGNOSIS — R69 Illness, unspecified: Secondary | ICD-10-CM | POA: Diagnosis not present

## 2021-03-16 DIAGNOSIS — G4733 Obstructive sleep apnea (adult) (pediatric): Secondary | ICD-10-CM | POA: Diagnosis not present

## 2021-03-16 DIAGNOSIS — R0600 Dyspnea, unspecified: Secondary | ICD-10-CM | POA: Diagnosis not present

## 2021-03-21 ENCOUNTER — Encounter: Payer: Self-pay | Admitting: Gastroenterology

## 2021-03-21 ENCOUNTER — Ambulatory Visit (INDEPENDENT_AMBULATORY_CARE_PROVIDER_SITE_OTHER): Payer: Medicare HMO | Admitting: Gastroenterology

## 2021-03-21 VITALS — BP 132/82 | HR 82 | Ht 65.0 in | Wt 214.2 lb

## 2021-03-21 DIAGNOSIS — K900 Celiac disease: Secondary | ICD-10-CM | POA: Insufficient documentation

## 2021-03-21 DIAGNOSIS — K5909 Other constipation: Secondary | ICD-10-CM | POA: Diagnosis not present

## 2021-03-21 MED ORDER — LINACLOTIDE 290 MCG PO CAPS: 290.0000 ug | ORAL_CAPSULE | Freq: Every day | ORAL | 0 refills | Status: DC

## 2021-03-21 NOTE — Patient Instructions (Addendum)
Stop Trulance.  ? ?We have given you samples of the following medication to take: ?Linzess 290 mcg daily before breakfast.  ? ?Call or send mychart message in 10 days with update. ? ?If you are age 59 or older, your body mass index should be between 23-30. Your Body mass index is 35.64 kg/m?Marland Kitchen If this is out of the aforementioned range listed, please consider follow up with your Primary Care Provider. ? ?If you are age 36 or younger, your body mass index should be between 19-25. Your Body mass index is 35.64 kg/m?Marland Kitchen If this is out of the aformentioned range listed, please consider follow up with your Primary Care Provider.  ? ?________________________________________________________ ? ?The [facility] providers would like to encourage you to use [facility] to communicate with providers for non-urgent requests or questions.  Due to long hold times on the telephone, sending your provider a message by [facility] may be a faster and more efficient way to get a response.  Please allow 48 business hours for a response.  Please remember that this is for non-urgent requests.  ?_______________________________________________________ ? ?

## 2021-03-21 NOTE — Progress Notes (Signed)
? ? ? ?03/21/2021 ?Connie Garrett ?[id number] ?[birth year] ? ? ?HISTORY OF PRESENT ILLNESS: This is a 61 year old female who is a patient Dr. Doyne Keel.  She is here today for complaints of constipation.  She has been battling constipation for a long time.  More recently she did try Linzess 145 mcg daily.  She is not a great historian, cannot really describe how it worked for her.  She is currently on Trulance, which it appears she had been on at some point in the past.  With that she is only having a bowel movement every 1 to 2 weeks.  She said that when she does go it is watery.  She has some iron deficiency.  Hemoglobin is normal, but she was recently started on once daily ferrous sulfate.  She also recently was diagnosed with possible celiac disease.  Had positive DQ 2 genetic test, but DQ 8 was negative.  Other serologic testing was negative as well, but EGD 11/2020 showed fairly typical changes for celiac disease and pathology was consistent with that.  She just started a gluten-free diet.  ? ?She had 12 polyps removed that were all adenomas 11/2020.  Repeat recommended in a year.  Also found to have multiple diverticula and internal hemorrhoids.  A large amount of liquid stool noted throughout required several minutes of lavage to clear. ? ? ?Past Medical History:  ?Diagnosis Date  ? Arthritis   ? Colon polyps   ? Depression   ? GERD (gastroesophageal reflux disease)   ? Hypertension   ? ?Past Surgical History:  ?Procedure Laterality Date  ? ABDOMINAL HYSTERECTOMY    ? [place]  ? colonosocpy    ? ESOPHAGOGASTRODUODENOSCOPY    ? EYE SURGERY    ? TOTAL KNEE ARTHROPLASTY Right 07/18/2018  ? Procedure: TOTAL KNEE ARTHROPLASTY;  Surgeon: Dorna Leitz, MD;  Location: [facility];  Service: Orthopedics;  Laterality: Right;  ? WRIST FRACTURE SURGERY Left   ? x 2  ? ? reports that she has never smoked. She has never used smokeless tobacco. She reports that she does not currently use drugs. She reports that she  does not drink alcohol. ?family history includes Diabetes in her mother. ?Allergies  ?Allergen Reactions  ? Shrimp Extract Allergy Skin Test   ? ? ?  ?Outpatient Encounter Medications as of 03/21/2021  ?Medication Sig  ? atorvastatin (LIPITOR) 20 MG tablet Take 20 mg by mouth at bedtime.  ? buPROPion (WELLBUTRIN SR) 200 MG 12 hr tablet Take 200 mg by mouth.  ? dicyclomine (BENTYL) 10 MG capsule Take 1 capsule (10 mg total) by mouth 3 (three) times daily as needed for spasms (abdominal pain).  ? escitalopram (LEXAPRO) 20 MG tablet Take 20 mg by mouth daily.  ? ferrous sulfate 325 (65 FE) MG EC tablet Take 1 tablet (325 mg total) by mouth daily with breakfast.  ? metoprolol succinate (TOPROL-XL) 25 MG 24 hr tablet Take by mouth.  ? Multiple Vitamin (MULTIVITAMIN WITH MINERALS) TABS tablet Take 1 tablet by mouth daily.  ? omeprazole (PRILOSEC) 20 MG capsule Take 20 mg by mouth daily.  ? tiZANidine (ZANAFLEX) 2 MG tablet Take 1 tablet (2 mg total) by mouth every 8 (eight) hours as needed for muscle spasms.  ? TRULANCE 3 MG TABS Take 1 tablet by mouth daily.  ? [DISCONTINUED] linaclotide (LINZESS) 145 MCG CAPS capsule Take 1 capsule (145 mcg total) by mouth daily before breakfast. Lot # [id number], exp: 06/2021  ? ?No facility-administered encounter  medications on file as of 03/21/2021.  ? ? ?REVIEW OF SYSTEMS  : All other systems reviewed and negative except where noted in the History of Present Illness.  ? ? ?PHYSICAL EXAM: ?BP 132/82   Pulse 82   Ht 5' 5"  (1.651 m)   Wt 214 lb 3.2 oz (97.2 kg)   BMI 35.64 kg/m?  ?General: Well developed white female in no acute distress ?Head: Normocephalic and atraumatic ?Eyes:  Sclerae anicteric, conjunctiva pink. ?Ears: Normal auditory acuity ?Lungs: Clear throughout to auscultation; no W/R/R. ?Heart: Regular rate and rhythm; no M/R/G. ?Abdomen: Soft, non-distended.  BS present.  Mild lower abdominal TTP. ?Musculoskeletal: Symmetrical with no gross deformities  ?Skin: No lesions on  visible extremities ?Extremities: No edema  ?Neurological: Alert oriented x 4, grossly non-focal ?Psychological:  Alert and cooperative. Normal mood and affect ? ?ASSESSMENT AND PLAN: ?*Iron deficiency: Possibly related to her diagnosis of possible diagnosis of celiac disease.  Hemoglobin is normal.  She just started on a gluten-free diet.  She also just started ferrous sulfate 325 mg daily.  This may worsen her constipation and she was advised that it can make her stools dark.  Previously tried Linzess 145 mcg daily.  Currently on Trulance daily with only a bowel movement every 1 to 2 weeks.  We will try higher dose Linzess at 290 mcg daily.  Samples were given.  She will contact us back in about 10 days with an update and we can decide if a plan to continue that with a prescription or switch to something else.  Other possibilities include Motegrity and Ibsrela. ?*Possible celiac disease:  Had positive DQ 2 genetic test, but DQ 8 was negative.  Other serologic testing was negative as well, but EGD 11/2020 showed fairly typical changes for celiac disease and pathology was consistent with that.  She just started a gluten-free diet.  Recommended that she have a repeat EGD in 6 months. ?*Personal history of colon polyps: She had 12 polyps removed that were all adenomas 11/2020.  Repeat recommended in a year. ? ? ?CC:  Marco Collie, MD ? ?  ?

## 2021-03-22 DIAGNOSIS — R911 Solitary pulmonary nodule: Secondary | ICD-10-CM | POA: Diagnosis not present

## 2021-03-22 DIAGNOSIS — I700 Atherosclerosis of aorta: Secondary | ICD-10-CM | POA: Diagnosis not present

## 2021-03-22 DIAGNOSIS — J432 Centrilobular emphysema: Secondary | ICD-10-CM | POA: Diagnosis not present

## 2021-03-22 DIAGNOSIS — J849 Interstitial pulmonary disease, unspecified: Secondary | ICD-10-CM | POA: Diagnosis not present

## 2021-03-22 DIAGNOSIS — R918 Other nonspecific abnormal finding of lung field: Secondary | ICD-10-CM | POA: Diagnosis not present

## 2021-03-22 DIAGNOSIS — R0600 Dyspnea, unspecified: Secondary | ICD-10-CM | POA: Diagnosis not present

## 2021-03-22 DIAGNOSIS — J219 Acute bronchiolitis, unspecified: Secondary | ICD-10-CM | POA: Diagnosis not present

## 2021-03-22 NOTE — Progress Notes (Signed)
Agree with assessment and plan as outlined. ?Her anti-Endo mesial antibodies are negative, TTG is negative.  Her genetic testing is HLA-DQ2 positive but DQ 8 negative, that profile gives her a 10% chance of celiac disease. ?  ?That being said her EGD showed fairly typical changes for celiac disease and her pathology was consistent with that.  She does have a mild iron deficiency.  Screened her for osteoporosis which she does have.  I think is very important to know over time if she truly has celiac disease which I think remains quite possible.  I recommended that she go on a gluten-free diet, previously referred her to nutritionist for this, and then will plan repeat an EGD in 6 months to assess for mucosal healing/improvement.   ?

## 2021-03-27 ENCOUNTER — Encounter: Payer: Medicare HMO | Attending: Gastroenterology | Admitting: Registered"

## 2021-03-27 ENCOUNTER — Other Ambulatory Visit: Payer: Self-pay | Admitting: Family Medicine

## 2021-03-27 DIAGNOSIS — Z1231 Encounter for screening mammogram for malignant neoplasm of breast: Secondary | ICD-10-CM

## 2021-03-29 DIAGNOSIS — Z6835 Body mass index (BMI) 35.0-35.9, adult: Secondary | ICD-10-CM | POA: Diagnosis not present

## 2021-03-29 DIAGNOSIS — I10 Essential (primary) hypertension: Secondary | ICD-10-CM | POA: Diagnosis not present

## 2021-03-30 ENCOUNTER — Other Ambulatory Visit: Payer: Medicare HMO | Admitting: *Deleted

## 2021-03-30 NOTE — Patient Outreach (Signed)
[facility]) Care Management ? ?03/30/2021 ? ?Connie Garrett ?08-15-1960 ?[id number] ? ?Unsuccessful outreach attempt made to patient. Patient answered the phone and stated that she would not be able to speak today. Patient did request that this nurse call back tomorrow.  ? ?Plan: ?RN Health Coach will call patient 03/31/21.  ? ?Emelia Loron RN, BSN ?[facility] Care Management  ?RN Health Coach ?[phone] ?[email or web address] ? ? ?

## 2021-03-31 ENCOUNTER — Other Ambulatory Visit: Payer: Self-pay | Admitting: *Deleted

## 2021-03-31 DIAGNOSIS — G4733 Obstructive sleep apnea (adult) (pediatric): Secondary | ICD-10-CM | POA: Diagnosis not present

## 2021-03-31 DIAGNOSIS — E668 Other obesity: Secondary | ICD-10-CM | POA: Diagnosis not present

## 2021-03-31 DIAGNOSIS — R0600 Dyspnea, unspecified: Secondary | ICD-10-CM | POA: Diagnosis not present

## 2021-03-31 DIAGNOSIS — J4530 Mild persistent asthma, uncomplicated: Secondary | ICD-10-CM | POA: Diagnosis not present

## 2021-03-31 DIAGNOSIS — R5383 Other fatigue: Secondary | ICD-10-CM | POA: Diagnosis not present

## 2021-03-31 DIAGNOSIS — R400 Somnolence: Secondary | ICD-10-CM | POA: Diagnosis not present

## 2021-03-31 NOTE — Patient Outreach (Addendum)
[facility]) Care Management ? ?03/31/2021 ? ?Connie Garrett ?1960-06-25 ?[id number] ? ?Unsuccessful outreach attempt made to patient. RN Health Coach left HIPAA compliant voicemail message along with her contact information. ? ?Plan: ?RN Health Coach will call patient within the month of April. ? ?Emelia Loron RN, BSN ?[facility] Care Management  ?RN Health Coach ?[phone] ?[email or web address] ? ? ?

## 2021-04-01 DIAGNOSIS — I10 Essential (primary) hypertension: Secondary | ICD-10-CM | POA: Diagnosis not present

## 2021-04-01 DIAGNOSIS — R69 Illness, unspecified: Secondary | ICD-10-CM | POA: Diagnosis not present

## 2021-04-11 ENCOUNTER — Ambulatory Visit: Payer: Medicare HMO

## 2021-04-12 DIAGNOSIS — G4733 Obstructive sleep apnea (adult) (pediatric): Secondary | ICD-10-CM | POA: Diagnosis not present

## 2021-04-16 DIAGNOSIS — G4733 Obstructive sleep apnea (adult) (pediatric): Secondary | ICD-10-CM | POA: Diagnosis not present

## 2021-04-16 DIAGNOSIS — R0600 Dyspnea, unspecified: Secondary | ICD-10-CM | POA: Diagnosis not present

## 2021-04-17 ENCOUNTER — Other Ambulatory Visit: Payer: Self-pay | Admitting: *Deleted

## 2021-04-17 DIAGNOSIS — R0600 Dyspnea, unspecified: Secondary | ICD-10-CM | POA: Diagnosis not present

## 2021-04-17 DIAGNOSIS — J4530 Mild persistent asthma, uncomplicated: Secondary | ICD-10-CM | POA: Diagnosis not present

## 2021-04-17 DIAGNOSIS — R5383 Other fatigue: Secondary | ICD-10-CM | POA: Diagnosis not present

## 2021-04-17 DIAGNOSIS — G4733 Obstructive sleep apnea (adult) (pediatric): Secondary | ICD-10-CM | POA: Diagnosis not present

## 2021-04-17 NOTE — Patient Outreach (Signed)
[facility]) Care Management ? ?04/17/2021 ? ?Rudi Coco ?Jul 29, 1960 ?[id number] ? ?Unsuccessful outreach attempt made to patient. RN Health Coach left HIPAA compliant voicemail message along with her contact information. ? ?Plan: ?RN Health Coach will call patient within the month of May. ? ?Emelia Loron RN, BSN ?[facility] Care Management  ?RN Health Coach ?[phone] ?[email or web address] ? ? ?

## 2021-04-19 ENCOUNTER — Ambulatory Visit
Admission: RE | Admit: 2021-04-19 | Discharge: 2021-04-19 | Disposition: A | Discharge status: Home / Self Care | Payer: Medicare HMO | Source: Ambulatory Visit | Attending: Family Medicine | Admitting: Family Medicine

## 2021-04-19 DIAGNOSIS — Z1231 Encounter for screening mammogram for malignant neoplasm of breast: Secondary | ICD-10-CM | POA: Diagnosis not present

## 2021-04-20 DIAGNOSIS — K5909 Other constipation: Secondary | ICD-10-CM | POA: Diagnosis not present

## 2021-04-20 DIAGNOSIS — J069 Acute upper respiratory infection, unspecified: Secondary | ICD-10-CM | POA: Diagnosis not present

## 2021-04-20 DIAGNOSIS — F331 Major depressive disorder, recurrent, moderate: Secondary | ICD-10-CM | POA: Diagnosis not present

## 2021-04-20 DIAGNOSIS — R69 Illness, unspecified: Secondary | ICD-10-CM | POA: Diagnosis not present

## 2021-04-20 DIAGNOSIS — Z20822 Contact with and (suspected) exposure to covid-19: Secondary | ICD-10-CM | POA: Diagnosis not present

## 2021-04-20 DIAGNOSIS — R059 Cough, unspecified: Secondary | ICD-10-CM | POA: Diagnosis not present

## 2021-04-24 ENCOUNTER — Ambulatory Visit: Payer: Self-pay | Admitting: *Deleted

## 2021-04-26 DIAGNOSIS — J020 Streptococcal pharyngitis: Secondary | ICD-10-CM | POA: Diagnosis not present

## 2021-04-26 DIAGNOSIS — Z20822 Contact with and (suspected) exposure to covid-19: Secondary | ICD-10-CM | POA: Diagnosis not present

## 2021-04-26 DIAGNOSIS — Z6835 Body mass index (BMI) 35.0-35.9, adult: Secondary | ICD-10-CM | POA: Diagnosis not present

## 2021-04-26 DIAGNOSIS — R059 Cough, unspecified: Secondary | ICD-10-CM | POA: Diagnosis not present

## 2021-05-01 DIAGNOSIS — F4325 Adjustment disorder with mixed disturbance of emotions and conduct: Secondary | ICD-10-CM | POA: Diagnosis not present

## 2021-05-01 DIAGNOSIS — G252 Other specified forms of tremor: Secondary | ICD-10-CM | POA: Diagnosis not present

## 2021-05-01 DIAGNOSIS — R4182 Altered mental status, unspecified: Secondary | ICD-10-CM | POA: Diagnosis not present

## 2021-05-01 DIAGNOSIS — R0902 Hypoxemia: Secondary | ICD-10-CM | POA: Diagnosis not present

## 2021-05-01 DIAGNOSIS — F430 Acute stress reaction: Secondary | ICD-10-CM | POA: Diagnosis not present

## 2021-05-01 DIAGNOSIS — R404 Transient alteration of awareness: Secondary | ICD-10-CM | POA: Diagnosis not present

## 2021-05-01 DIAGNOSIS — E876 Hypokalemia: Secondary | ICD-10-CM | POA: Diagnosis not present

## 2021-05-01 DIAGNOSIS — R69 Illness, unspecified: Secondary | ICD-10-CM | POA: Diagnosis not present

## 2021-05-01 DIAGNOSIS — Z743 Need for continuous supervision: Secondary | ICD-10-CM | POA: Diagnosis not present

## 2021-05-01 DIAGNOSIS — I10 Essential (primary) hypertension: Secondary | ICD-10-CM | POA: Diagnosis not present

## 2021-05-01 DIAGNOSIS — R569 Unspecified convulsions: Secondary | ICD-10-CM | POA: Diagnosis not present

## 2021-05-01 DIAGNOSIS — Z79899 Other long term (current) drug therapy: Secondary | ICD-10-CM | POA: Diagnosis not present

## 2021-05-03 ENCOUNTER — Other Ambulatory Visit: Payer: Self-pay | Admitting: *Deleted

## 2021-05-03 NOTE — Patient Outreach (Signed)
[facility]) Care Management ? ?05/03/2021 ? ?Pincus Sanes ?[birth year] ?[id number] ? ?Unsuccessful outreach attempt made to patient. Patient answered the phone and stated that she would not be able to speak today. Patient did request that this nurse call back at a later date.  ? ?Plan: ?RN Health Coach will call patient within the month of June. ? ?Emelia Loron RN, BSN ?[facility] Care Management  ?RN Health Coach ?[phone] ?[email or web address] ? ? ?

## 2021-05-06 DIAGNOSIS — I700 Atherosclerosis of aorta: Secondary | ICD-10-CM | POA: Diagnosis not present

## 2021-05-06 DIAGNOSIS — K900 Celiac disease: Secondary | ICD-10-CM | POA: Diagnosis not present

## 2021-05-06 DIAGNOSIS — E785 Hyperlipidemia, unspecified: Secondary | ICD-10-CM | POA: Diagnosis not present

## 2021-05-06 DIAGNOSIS — E119 Type 2 diabetes mellitus without complications: Secondary | ICD-10-CM | POA: Diagnosis not present

## 2021-05-06 DIAGNOSIS — I10 Essential (primary) hypertension: Secondary | ICD-10-CM | POA: Diagnosis not present

## 2021-05-06 DIAGNOSIS — J439 Emphysema, unspecified: Secondary | ICD-10-CM | POA: Diagnosis not present

## 2021-05-06 DIAGNOSIS — R69 Illness, unspecified: Secondary | ICD-10-CM | POA: Diagnosis not present

## 2021-05-06 DIAGNOSIS — Z008 Encounter for other general examination: Secondary | ICD-10-CM | POA: Diagnosis not present

## 2021-05-06 DIAGNOSIS — G4700 Insomnia, unspecified: Secondary | ICD-10-CM | POA: Diagnosis not present

## 2021-05-06 DIAGNOSIS — J309 Allergic rhinitis, unspecified: Secondary | ICD-10-CM | POA: Diagnosis not present

## 2021-05-06 DIAGNOSIS — G4733 Obstructive sleep apnea (adult) (pediatric): Secondary | ICD-10-CM | POA: Diagnosis not present

## 2021-05-12 DIAGNOSIS — G4733 Obstructive sleep apnea (adult) (pediatric): Secondary | ICD-10-CM | POA: Diagnosis not present

## 2021-05-15 DIAGNOSIS — R0600 Dyspnea, unspecified: Secondary | ICD-10-CM | POA: Diagnosis not present

## 2021-05-15 DIAGNOSIS — R5383 Other fatigue: Secondary | ICD-10-CM | POA: Diagnosis not present

## 2021-05-15 DIAGNOSIS — G4733 Obstructive sleep apnea (adult) (pediatric): Secondary | ICD-10-CM | POA: Diagnosis not present

## 2021-05-15 DIAGNOSIS — J4530 Mild persistent asthma, uncomplicated: Secondary | ICD-10-CM | POA: Diagnosis not present

## 2021-05-16 DIAGNOSIS — R0600 Dyspnea, unspecified: Secondary | ICD-10-CM | POA: Diagnosis not present

## 2021-05-16 DIAGNOSIS — G4733 Obstructive sleep apnea (adult) (pediatric): Secondary | ICD-10-CM | POA: Diagnosis not present

## 2021-05-18 DIAGNOSIS — E1169 Type 2 diabetes mellitus with other specified complication: Secondary | ICD-10-CM | POA: Diagnosis not present

## 2021-05-25 DIAGNOSIS — E1169 Type 2 diabetes mellitus with other specified complication: Secondary | ICD-10-CM | POA: Diagnosis not present

## 2021-05-25 DIAGNOSIS — Z0000 Encounter for general adult medical examination without abnormal findings: Secondary | ICD-10-CM | POA: Diagnosis not present

## 2021-05-25 DIAGNOSIS — Z6833 Body mass index (BMI) 33.0-33.9, adult: Secondary | ICD-10-CM | POA: Diagnosis not present

## 2021-05-25 DIAGNOSIS — E785 Hyperlipidemia, unspecified: Secondary | ICD-10-CM | POA: Diagnosis not present

## 2021-05-25 DIAGNOSIS — R69 Illness, unspecified: Secondary | ICD-10-CM | POA: Diagnosis not present

## 2021-05-25 DIAGNOSIS — Z23 Encounter for immunization: Secondary | ICD-10-CM | POA: Diagnosis not present

## 2021-05-25 DIAGNOSIS — I10 Essential (primary) hypertension: Secondary | ICD-10-CM | POA: Diagnosis not present

## 2021-06-01 DIAGNOSIS — E1169 Type 2 diabetes mellitus with other specified complication: Secondary | ICD-10-CM | POA: Diagnosis not present

## 2021-06-01 DIAGNOSIS — I10 Essential (primary) hypertension: Secondary | ICD-10-CM | POA: Diagnosis not present

## 2021-06-08 DIAGNOSIS — K921 Melena: Secondary | ICD-10-CM | POA: Diagnosis not present

## 2021-06-08 DIAGNOSIS — Z6833 Body mass index (BMI) 33.0-33.9, adult: Secondary | ICD-10-CM | POA: Diagnosis not present

## 2021-06-08 DIAGNOSIS — K589 Irritable bowel syndrome without diarrhea: Secondary | ICD-10-CM | POA: Diagnosis not present

## 2021-06-15 ENCOUNTER — Other Ambulatory Visit: Payer: Self-pay | Admitting: *Deleted

## 2021-06-15 NOTE — Patient Outreach (Signed)
[facility]) Care Management  06/15/2021  RIO TABER 1960/08/18 [id number]  Multiple attempts to establish contact with patient without success. Case is being closed at this time.   Plan: RN Health Coach will close case and will send PCP and patient a case closed letter.   Emelia Loron RN, BSN [place] [phone] [email or web address]

## 2021-06-16 DIAGNOSIS — G4733 Obstructive sleep apnea (adult) (pediatric): Secondary | ICD-10-CM | POA: Diagnosis not present

## 2021-06-16 DIAGNOSIS — R0600 Dyspnea, unspecified: Secondary | ICD-10-CM | POA: Diagnosis not present

## 2021-06-23 ENCOUNTER — Ambulatory Visit: Payer: Self-pay | Admitting: *Deleted

## 2021-06-30 DIAGNOSIS — E1169 Type 2 diabetes mellitus with other specified complication: Secondary | ICD-10-CM | POA: Diagnosis not present

## 2021-07-01 DIAGNOSIS — E1169 Type 2 diabetes mellitus with other specified complication: Secondary | ICD-10-CM | POA: Diagnosis not present

## 2021-07-01 DIAGNOSIS — I10 Essential (primary) hypertension: Secondary | ICD-10-CM | POA: Diagnosis not present

## 2021-07-13 DIAGNOSIS — Z1231 Encounter for screening mammogram for malignant neoplasm of breast: Secondary | ICD-10-CM | POA: Diagnosis not present

## 2021-07-13 DIAGNOSIS — K589 Irritable bowel syndrome without diarrhea: Secondary | ICD-10-CM | POA: Diagnosis not present

## 2021-07-13 DIAGNOSIS — R5383 Other fatigue: Secondary | ICD-10-CM | POA: Diagnosis not present

## 2021-07-13 DIAGNOSIS — Z6832 Body mass index (BMI) 32.0-32.9, adult: Secondary | ICD-10-CM | POA: Diagnosis not present

## 2021-07-16 DIAGNOSIS — G4733 Obstructive sleep apnea (adult) (pediatric): Secondary | ICD-10-CM | POA: Diagnosis not present

## 2021-07-16 DIAGNOSIS — R0600 Dyspnea, unspecified: Secondary | ICD-10-CM | POA: Diagnosis not present

## 2021-07-24 ENCOUNTER — Encounter: Payer: Self-pay | Admitting: Gastroenterology

## 2021-07-31 DIAGNOSIS — E1169 Type 2 diabetes mellitus with other specified complication: Secondary | ICD-10-CM | POA: Diagnosis not present

## 2021-08-02 DIAGNOSIS — I10 Essential (primary) hypertension: Secondary | ICD-10-CM | POA: Diagnosis not present

## 2021-08-02 DIAGNOSIS — R69 Illness, unspecified: Secondary | ICD-10-CM | POA: Diagnosis not present

## 2021-08-02 DIAGNOSIS — Z1231 Encounter for screening mammogram for malignant neoplasm of breast: Secondary | ICD-10-CM | POA: Diagnosis not present

## 2021-08-02 DIAGNOSIS — F411 Generalized anxiety disorder: Secondary | ICD-10-CM | POA: Diagnosis not present

## 2021-08-02 DIAGNOSIS — F331 Major depressive disorder, recurrent, moderate: Secondary | ICD-10-CM | POA: Diagnosis not present

## 2021-08-02 DIAGNOSIS — E1169 Type 2 diabetes mellitus with other specified complication: Secondary | ICD-10-CM | POA: Diagnosis not present

## 2021-08-02 DIAGNOSIS — Z6832 Body mass index (BMI) 32.0-32.9, adult: Secondary | ICD-10-CM | POA: Diagnosis not present

## 2021-08-08 DIAGNOSIS — E785 Hyperlipidemia, unspecified: Secondary | ICD-10-CM | POA: Diagnosis not present

## 2021-08-08 DIAGNOSIS — E1169 Type 2 diabetes mellitus with other specified complication: Secondary | ICD-10-CM | POA: Diagnosis not present

## 2021-08-08 DIAGNOSIS — E876 Hypokalemia: Secondary | ICD-10-CM | POA: Diagnosis not present

## 2021-08-08 DIAGNOSIS — R0602 Shortness of breath: Secondary | ICD-10-CM | POA: Diagnosis not present

## 2021-08-08 DIAGNOSIS — I10 Essential (primary) hypertension: Secondary | ICD-10-CM | POA: Diagnosis not present

## 2021-08-08 DIAGNOSIS — Z6832 Body mass index (BMI) 32.0-32.9, adult: Secondary | ICD-10-CM | POA: Diagnosis not present

## 2021-08-14 DIAGNOSIS — R5383 Other fatigue: Secondary | ICD-10-CM | POA: Diagnosis not present

## 2021-08-14 DIAGNOSIS — G4733 Obstructive sleep apnea (adult) (pediatric): Secondary | ICD-10-CM | POA: Diagnosis not present

## 2021-08-14 DIAGNOSIS — R0600 Dyspnea, unspecified: Secondary | ICD-10-CM | POA: Diagnosis not present

## 2021-08-14 DIAGNOSIS — J4530 Mild persistent asthma, uncomplicated: Secondary | ICD-10-CM | POA: Diagnosis not present

## 2021-08-14 DIAGNOSIS — R400 Somnolence: Secondary | ICD-10-CM | POA: Diagnosis not present

## 2021-08-15 DIAGNOSIS — I10 Essential (primary) hypertension: Secondary | ICD-10-CM | POA: Diagnosis not present

## 2021-08-16 DIAGNOSIS — G4733 Obstructive sleep apnea (adult) (pediatric): Secondary | ICD-10-CM | POA: Diagnosis not present

## 2021-08-16 DIAGNOSIS — R0600 Dyspnea, unspecified: Secondary | ICD-10-CM | POA: Diagnosis not present

## 2021-08-24 DIAGNOSIS — G4733 Obstructive sleep apnea (adult) (pediatric): Secondary | ICD-10-CM | POA: Diagnosis not present

## 2021-08-31 DIAGNOSIS — E1169 Type 2 diabetes mellitus with other specified complication: Secondary | ICD-10-CM | POA: Diagnosis not present

## 2021-08-31 DIAGNOSIS — I10 Essential (primary) hypertension: Secondary | ICD-10-CM | POA: Diagnosis not present

## 2021-09-05 DIAGNOSIS — J4530 Mild persistent asthma, uncomplicated: Secondary | ICD-10-CM | POA: Diagnosis not present

## 2021-09-05 DIAGNOSIS — R5383 Other fatigue: Secondary | ICD-10-CM | POA: Diagnosis not present

## 2021-09-05 DIAGNOSIS — R0600 Dyspnea, unspecified: Secondary | ICD-10-CM | POA: Diagnosis not present

## 2021-09-05 DIAGNOSIS — R400 Somnolence: Secondary | ICD-10-CM | POA: Diagnosis not present

## 2021-09-05 DIAGNOSIS — G4733 Obstructive sleep apnea (adult) (pediatric): Secondary | ICD-10-CM | POA: Diagnosis not present

## 2021-09-16 DIAGNOSIS — G4733 Obstructive sleep apnea (adult) (pediatric): Secondary | ICD-10-CM | POA: Diagnosis not present

## 2021-09-16 DIAGNOSIS — R0600 Dyspnea, unspecified: Secondary | ICD-10-CM | POA: Diagnosis not present

## 2021-09-22 DIAGNOSIS — G4733 Obstructive sleep apnea (adult) (pediatric): Secondary | ICD-10-CM | POA: Diagnosis not present

## 2021-09-26 DIAGNOSIS — Z0100 Encounter for examination of eyes and vision without abnormal findings: Secondary | ICD-10-CM | POA: Diagnosis not present

## 2021-09-26 DIAGNOSIS — H26493 Other secondary cataract, bilateral: Secondary | ICD-10-CM | POA: Diagnosis not present

## 2021-10-16 DIAGNOSIS — G4733 Obstructive sleep apnea (adult) (pediatric): Secondary | ICD-10-CM | POA: Diagnosis not present

## 2021-10-16 DIAGNOSIS — R0600 Dyspnea, unspecified: Secondary | ICD-10-CM | POA: Diagnosis not present

## 2021-10-17 ENCOUNTER — Other Ambulatory Visit (INDEPENDENT_AMBULATORY_CARE_PROVIDER_SITE_OTHER): Payer: Medicare HMO

## 2021-10-17 ENCOUNTER — Encounter: Payer: Self-pay | Admitting: Gastroenterology

## 2021-10-17 ENCOUNTER — Ambulatory Visit (INDEPENDENT_AMBULATORY_CARE_PROVIDER_SITE_OTHER): Payer: Medicare HMO | Admitting: Gastroenterology

## 2021-10-17 DIAGNOSIS — K5909 Other constipation: Secondary | ICD-10-CM

## 2021-10-17 DIAGNOSIS — Z86010 Personal history of colonic polyps: Secondary | ICD-10-CM | POA: Diagnosis not present

## 2021-10-17 DIAGNOSIS — R1310 Dysphagia, unspecified: Secondary | ICD-10-CM

## 2021-10-17 DIAGNOSIS — K319 Disease of stomach and duodenum, unspecified: Secondary | ICD-10-CM

## 2021-10-17 LAB — CBC
HCT: 36.7 % (ref 36.0–46.0)
Hemoglobin: 12.5 g/dL (ref 12.0–15.0)
MCHC: 34 g/dL (ref 30.0–36.0)
MCV: 88 fl (ref 78.0–100.0)
Platelets: 273 10*3/uL (ref 150.0–400.0)
RBC: 4.17 Mil/uL (ref 3.87–5.11)
RDW: 13.6 % (ref 11.5–15.5)
WBC: 9 10*3/uL (ref 4.0–10.5)

## 2021-10-17 LAB — IBC + FERRITIN
Ferritin: 109.3 ng/mL (ref 10.0–291.0)
Iron: 64 ug/dL (ref 42–145)
Saturation Ratios: 22.9 % (ref 20.0–50.0)
TIBC: 280 ug/dL (ref 250.0–450.0)
Transferrin: 200 mg/dL — ABNORMAL LOW (ref 212.0–360.0)

## 2021-10-17 MED ORDER — IBGARD 90 MG PO CPCR: 1.0000 | ORAL_CAPSULE | Freq: Every day | ORAL | 3 refills | Status: DC

## 2021-10-17 MED ORDER — LINACLOTIDE 290 MCG PO CAPS: 290.0000 ug | ORAL_CAPSULE | ORAL | 0 refills | Status: DC

## 2021-10-17 MED ORDER — SUTAB 1479-225-188 MG PO TABS: ORAL_TABLET | ORAL | 0 refills | Status: DC

## 2021-10-17 NOTE — Patient Instructions (Signed)
If you are age 59 or younger, your body mass index should be between 19-25. Your Body mass index is 34.83 kg/m. If this is out of the aformentioned range listed, please consider follow up with your Primary Care Provider.  ________________________________________________________  The [place] [facility] providers would like to encourage you to use [facility] to communicate with providers for non-urgent requests or questions.  Due to long hold times on the telephone, sending your provider a message by [facility] may be a faster and more efficient way to get a response.  Please allow 48 business hours for a response.  Please remember that this is for non-urgent requests.  _______________________________________________________  Your provider has requested that you go to the basement level for lab work before leaving today. Press "B" on the elevator. The lab is located at the first door on the left as you exit the elevator.  Due to recent changes in healthcare laws, you may see the results of your imaging and laboratory studies on MyChart before your provider has had a chance to review them.  We understand that in some cases there may be results that are confusing or concerning to you. Not all laboratory results come back in the same time frame and the provider may be waiting for multiple results in order to interpret others.  Please give [place] 48 hours in order for your provider to thoroughly review all the results before contacting the office for clarification of your results.   You have been scheduled for an endoscopy and colonoscopy. Please follow the written instructions given to you at your visit today. Please pick up your prep supplies at the pharmacy within the next 1-3 days. If you use inhalers (even only as needed), please bring them with you on the day of your procedure.  REDUCE: Linzess to every other day  START: Ibgard daily (samples given today)  Please discuss taking Ozempic with your primary care  physician.  Thank you for entrusting me with your care and choosing [facility].  Dr Havery Moros

## 2021-10-17 NOTE — Progress Notes (Signed)
HPI :  61 year old female here for follow-up visit for suspected celiac disease, constipation, history of colon polyps.     Recall she has a history of constipation. She had been on MiraLAX in the past without much improvement.  She tried Amitiza, Trulance, and lower dose Linzess previously which did not help as much.  Most recently this past March was given higher dose Linzess of 290 mcg/day.  She states this has made her stools loose and she is having multiple loose stools per day.  She also says she has been on Ozempic now for several weeks.  She has lost a significant amount of weight on Ozempic down to 190 pounds from a peak of 235 pounds.  While on Ozempic she has had a decreased appetite, early satiety, daughter states she really is not eating too much.  Unclear if Ozempic has led to a change in her bowel habits as well.  Her colonoscopy with me in November 2022 revealed 12 polyps, 11 of them were adenomas, she was told to repeat a colonoscopy in 1 year for surveillance.  She denies any family history of colon cancer.  Recall she has a history of GERD and dysphagia that was evaluated with an EGD November.  Her dysphagia localized to her throat area and food has been getting stuck there for the past few years.  GERD has been controlled with omeprazole 20 mg daily.  EGD did not show any overt stenosis or stricture but was empirically dilated with a savory dilator to 18 mm and mucosal rent was noted just inferior to the UES, thought to be site of subtle stricture.  She thinks the dilation helped, did not resolve her symptoms completely but has improved it.  This is not really bothering her too much.  Incidentally noted on that exam she had some flattening of her duodenal mucosa that was concerning for underlying celiac disease.  Biopsies were taken and pathology suggested findings grossly consistent with celiac disease, marsh type IIIb.  We did follow-up TTGIgA and IgA levels as well as TTG IgG,  and both were negative.  She had Endo mesial antibodies that were negative.  She had genetic testing done that showed HLA-DQ2 positive, DQ 8 negative, risk for celiac disease given her genetic result was 10%.  I discussed with her that I suspect that she has celiac disease given the results of her testing I will be unusual with negative antibodies.  Recommended a gluten-free diet.  She had lab testing in January that showed a mild iron deficiency.  B12 level and vitamin D and TSH was normal.  We performed a DEXA scan for her in January and she was noted to have osteoporosis.  Following with her primary care for that.  She states she really has not been too compliant with a gluten-free diet.  She has "bites of gluten" frequently.  Daughter states especially being on Ozempic which is hard her appetite, its become more hard/difficult to stay on a gluten-free diet.  She does have some bloating that bothers her, she had thought due to constipation over time.  She takes Bentyl as needed for occasional aches and pains which helps but could induce constipation.  We discussed other options. She is taking iron supplementation.  She had a pneumococcal vaccine since I have last seen her.      Endoscopic evaluation: EGD 11/11/20 -  - The exam of the esophagus was otherwise normal. No focal stenosis / stricture noted. - A guidewire  was placed and the scope was withdrawn. Empiric dilation was performed in the entire esophagus with a Savary dilator with mild resistance at 17 mm (no heme) and then with 18 mm (heme noted). Relook endoscopy showed appropriate mucosal wrent just inferior to the UES at site of subtle stenosis which was not appreciated endoscopically. - The entire examined stomach was normal. - Flattening and mild edema was found in the duodenal bulb and in the second portion of the duodenum. Biopsies for histology were taken with a cold forceps for evaluation of celiac disease. - The exam of the  duodenum was otherwise normal.   Colonoscopy 11/11/20 The perianal and digital rectal examinations were normal. - Two sessile polyps were found in the cecum. The polyps were 3 mm in size. These polyps were removed with a cold snare. Resection and retrieval were complete. - Two sessile polyps were found in the ileocecal valve. The polyps were 3 to 10 mm in size. These polyps were removed with a cold snare. Resection and retrieval were complete. - Three sessile polyps were found in the ascending colon. The polyps were 3 to 4 mm in size. These polyps were removed with a cold snare. Resection and retrieval were complete. - Three sessile polyps were found in the hepatic flexure. The polyps were 3 to 5 mm in size. These polyps were removed with a cold snare. Resection and retrieval were complete. - A 4 mm polyp was found in the transverse colon. The polyp was sessile. The polyp was removed with a cold snare. Resection and retrieval were complete. - A 5 mm polyp was found in the sigmoid colon. The polyp was sessile. The polyp was removed with a cold snare. Resection and retrieval were complete. - Multiple small-mouthed diverticula were found in the sigmoid colon. - Internal hemorrhoids were found during retroflexion. - A large amount of liquid stool was found in the entire colon, making visualization difficult. Lavage of the colon was performed using copious amounts of sterile water, resulting in clearance with adequate visualization. This process took several minutes. - The exam was otherwise without abnormality.   1. Surgical [P], duodenal bxs - DUODENAL MUCOSA WITH VILLOUS ATROPHY AND INCREASED INTRAEPITHELIAL LYMPHOCYTES CONSISTENT WITH CELIAC SPRUE IN THE PROPER CLINICAL CONTEXT (MARSH TYPE 3B). 2. Surgical [P], colon, sigmoid x1, polyp (1) - HYPERPLASTIC POLYP. 3. Surgical [P], colon, transverse x1, hepatic flexure x2, cecal x2, ileocecal valve x2, ascending x3, polyp (10) - TUBULAR  ADENOMA(S) WITHOUT HIGH GRADE DYSPLASIA.   TTG IgG 1.0 (normal) TTG Iga 2.9 (normal)    HLA DQ2 positive, DQ8 negative - 10% chance of celiac Endomysial ABs negative   Past Medical History:  Diagnosis Date   Arthritis    Celiac disease    suspected   Colon polyps    Depression    GERD (gastroesophageal reflux disease)    Hypertension    Osteoporosis      Past Surgical History:  Procedure Laterality Date   ABDOMINAL HYSTERECTOMY     BACK SURGERY  1990   colonosocpy     ESOPHAGOGASTRODUODENOSCOPY     EYE SURGERY     TOTAL KNEE ARTHROPLASTY Right 07/18/2018   Procedure: TOTAL KNEE ARTHROPLASTY;  Surgeon: Dorna Leitz, MD;  Location: [facility];  Service: Orthopedics;  Laterality: Right;   WRIST FRACTURE SURGERY Left    x 2   Family History  Problem Relation Age of Onset   Diabetes Mother    Colon cancer Neg Hx    Colon  polyps Neg Hx    Rectal cancer Neg Hx    Esophageal cancer Neg Hx    Stomach cancer Neg Hx    Social History   Tobacco Use   Smoking status: Never   Smokeless tobacco: Never  Vaping Use   Vaping Use: Never used  Substance Use Topics   Alcohol use: Never   Drug use: Not Currently   Current Outpatient Medications  Medication Sig Dispense Refill   atorvastatin (LIPITOR) 20 MG tablet Take 20 mg by mouth at bedtime.     dicyclomine (BENTYL) 10 MG capsule Take 1 capsule (10 mg total) by mouth 3 (three) times daily as needed for spasms (abdominal pain). 30 capsule 1   escitalopram (LEXAPRO) 20 MG tablet Take 20 mg by mouth daily.     ferrous sulfate 325 (65 FE) MG EC tablet Take 1 tablet (325 mg total) by mouth daily with breakfast. 30 tablet 0   metoprolol succinate (TOPROL-XL) 25 MG 24 hr tablet Take by mouth.     Multiple Vitamin (MULTIVITAMIN WITH MINERALS) TABS tablet Take 1 tablet by mouth daily.     omeprazole (PRILOSEC) 20 MG capsule Take 20 mg by mouth daily.     Peppermint Oil (IBGARD) 90 MG CPCR Take 1 capsule by mouth daily at 2 PM. 30  capsule 3   Sodium Sulfate-Mag Sulfate-KCl (SUTAB) [phone] MG TABS Use as directed 24 tablet 0   buPROPion (WELLBUTRIN SR) 200 MG 12 hr tablet Take 200 mg by mouth. (Patient not taking: Reported on 10/17/2021)     linaclotide (LINZESS) 290 MCG CAPS capsule Take 1 capsule (290 mcg total) by mouth every other day. 10 capsule 0   tiZANidine (ZANAFLEX) 2 MG tablet Take 1 tablet (2 mg total) by mouth every 8 (eight) hours as needed for muscle spasms. (Patient not taking: Reported on 10/17/2021) 40 tablet 0   No current facility-administered medications for this visit.   Allergies  Allergen Reactions   Shrimp Extract Allergy Skin Test      Review of Systems: All systems reviewed and negative except where noted in HPI.    No results found.  Physical Exam: BP 122/64   Pulse 86   Ht 5' 2"  (1.575 m)   Wt 190 lb 7 oz (86.4 kg)   BMI 34.83 kg/m  Constitutional: Pleasant,well-developed, female in no acute distress. Neurological: Alert and oriented to person place and time. Psychiatric: Normal mood and affect. Behavior is normal.   ASSESSMENT: 62 y.o. female here for assessment of the following  1. Mucosal abnormality of duodenum   2. Dysphagia, unspecified type   3. Chronic constipation   4. History of colon polyps    Reviewed work-up to date with her regarding suspected celiac disease.  Her antibody panel is negative, however genetic testing does show DQ2 positive which makes it possible.  Her pathology and endoscopic finding was certainly concerning for it.  She did have a mild iron deficiency, she has not really been on a strict gluten-free diet since have seen her.  She has lost a lot of weight on Ozempic but definitely having side effects from this in regards to her early satiety, poor appetite, bowel changes.  Daughter is quite concerned about her ongoing weight loss and effects of Ozempic.  I encouraged them to talk with her primary care or prescribing provider of Ozempic to  determine goals of this medicine and when they want to stop it, but she has had a good response in regards  to her weight loss.  It sounds like her prior bowel regimens have not been strong enough for her, however now on high-dose Linzess and Ozempic she is having more loose stools than diarrhea.  She can back off the Linzess and see if this helps, can go to every other day dosing or as needed.  She will talk with her primary physician about the Ozempic use, perhaps stopping this will help with her bowel habits.  She has had numerous adenomas removed 1 year ago, due for surveillance colonoscopy in November.  Discussed risks and benefits of this and she wants to proceed with that.  At the same time I offered her a follow-up endoscopy to see if her mucosal changes persist in her small bowel to reevaluate for suspected celiac disease.  Ideally would have her on a strict gluten-free diet for several months however she does not appear willing to do that at least right now.  If mucosal changes persist on EGD, that would give more evidence for persistent celiac and perhaps she would be willing to consider gluten-free diet more seriously.  I have counseled her on celiac disease, risks for uncontrolled disease to include vitamin deficiencies, bloating, loose stools, and increased risk for lymphoma etc.  Otherwise for her abdominal cramping we will give her some free samples of IBgard which she can use as needed.  We will also follow-up her iron studies and CBC to make sure stable on iron supplementation.  PLAN: - lab today for CBC, TIBC / ferritin  - reduce Linzess to every other day dosing or PRN. Loose stools could be due to higher dose Linzess, Ozempic, or untreated celiac disease - talk with PCP about Ozempic and end point of use given her side effects - trial of IB gard for cramps - schedule EGD and colonoscopy for November in the [place], [place] [facility]

## 2021-10-22 DIAGNOSIS — G4733 Obstructive sleep apnea (adult) (pediatric): Secondary | ICD-10-CM | POA: Diagnosis not present

## 2021-11-08 DIAGNOSIS — E1169 Type 2 diabetes mellitus with other specified complication: Secondary | ICD-10-CM | POA: Diagnosis not present

## 2021-11-08 DIAGNOSIS — I10 Essential (primary) hypertension: Secondary | ICD-10-CM | POA: Diagnosis not present

## 2021-11-11 ENCOUNTER — Encounter: Payer: Self-pay | Admitting: Certified Registered Nurse Anesthetist

## 2021-11-14 DIAGNOSIS — E785 Hyperlipidemia, unspecified: Secondary | ICD-10-CM | POA: Diagnosis not present

## 2021-11-14 DIAGNOSIS — R32 Unspecified urinary incontinence: Secondary | ICD-10-CM | POA: Diagnosis not present

## 2021-11-14 DIAGNOSIS — I10 Essential (primary) hypertension: Secondary | ICD-10-CM | POA: Diagnosis not present

## 2021-11-14 DIAGNOSIS — Z23 Encounter for immunization: Secondary | ICD-10-CM | POA: Diagnosis not present

## 2021-11-14 DIAGNOSIS — R82998 Other abnormal findings in urine: Secondary | ICD-10-CM | POA: Diagnosis not present

## 2021-11-14 DIAGNOSIS — E1169 Type 2 diabetes mellitus with other specified complication: Secondary | ICD-10-CM | POA: Diagnosis not present

## 2021-11-14 DIAGNOSIS — Z6831 Body mass index (BMI) 31.0-31.9, adult: Secondary | ICD-10-CM | POA: Diagnosis not present

## 2021-11-16 DIAGNOSIS — G4733 Obstructive sleep apnea (adult) (pediatric): Secondary | ICD-10-CM | POA: Diagnosis not present

## 2021-11-16 DIAGNOSIS — R0600 Dyspnea, unspecified: Secondary | ICD-10-CM | POA: Diagnosis not present

## 2021-11-17 ENCOUNTER — Encounter: Payer: Medicare HMO | Admitting: Gastroenterology

## 2021-11-20 DIAGNOSIS — R918 Other nonspecific abnormal finding of lung field: Secondary | ICD-10-CM | POA: Diagnosis not present

## 2021-11-20 DIAGNOSIS — J4530 Mild persistent asthma, uncomplicated: Secondary | ICD-10-CM | POA: Diagnosis not present

## 2021-11-20 DIAGNOSIS — G4733 Obstructive sleep apnea (adult) (pediatric): Secondary | ICD-10-CM | POA: Diagnosis not present

## 2021-11-20 DIAGNOSIS — R0600 Dyspnea, unspecified: Secondary | ICD-10-CM | POA: Diagnosis not present

## 2021-11-20 DIAGNOSIS — R400 Somnolence: Secondary | ICD-10-CM | POA: Diagnosis not present

## 2021-11-20 DIAGNOSIS — R5383 Other fatigue: Secondary | ICD-10-CM | POA: Diagnosis not present

## 2021-11-22 DIAGNOSIS — G4733 Obstructive sleep apnea (adult) (pediatric): Secondary | ICD-10-CM | POA: Diagnosis not present

## 2021-12-04 ENCOUNTER — Ambulatory Visit (AMBULATORY_SURGERY_CENTER): Payer: Medicare HMO | Admitting: *Deleted

## 2021-12-04 VITALS — Ht 65.0 in | Wt 189.0 lb

## 2021-12-04 DIAGNOSIS — R1310 Dysphagia, unspecified: Secondary | ICD-10-CM

## 2021-12-04 DIAGNOSIS — Z86010 Personal history of colonic polyps: Secondary | ICD-10-CM

## 2021-12-04 NOTE — Progress Notes (Signed)
No egg or soy allergy known to patient  No issues known to pt with past sedation with any surgeries or procedures Patient denies ever being told they had issues or difficulty with intubation  No FH of Malignant Hyperthermia Pt is not on diet pills Pt is not on  home 02  Pt is not on blood thinners  Pt denies issues with constipation  Pt encouraged to use to use Singlecare or Goodrx to reduce cost Telephone visit Indop sent via my chart and via mail Patient's chart reviewed by Osvaldo Angst CNRA prior to previsit and patient appropriate for the [place].  Previsit completed and red dot placed by patient's name on their procedure day (on provider's schedule).  Marland Kitchen

## 2021-12-16 DIAGNOSIS — G4733 Obstructive sleep apnea (adult) (pediatric): Secondary | ICD-10-CM | POA: Diagnosis not present

## 2021-12-16 DIAGNOSIS — R0600 Dyspnea, unspecified: Secondary | ICD-10-CM | POA: Diagnosis not present

## 2022-01-02 ENCOUNTER — Encounter: Payer: Self-pay | Admitting: Certified Registered Nurse Anesthetist

## 2022-01-02 DIAGNOSIS — M25511 Pain in right shoulder: Secondary | ICD-10-CM | POA: Diagnosis not present

## 2022-01-02 DIAGNOSIS — G8929 Other chronic pain: Secondary | ICD-10-CM | POA: Diagnosis not present

## 2022-01-03 ENCOUNTER — Ambulatory Visit (AMBULATORY_SURGERY_CENTER): Payer: Medicare HMO | Admitting: Gastroenterology

## 2022-01-03 ENCOUNTER — Encounter: Payer: Self-pay | Admitting: Gastroenterology

## 2022-01-03 VITALS — BP 119/67 | HR 83 | Temp 98.1°F | Resp 16 | Ht 63.0 in | Wt 189.0 lb

## 2022-01-03 DIAGNOSIS — K319 Disease of stomach and duodenum, unspecified: Secondary | ICD-10-CM | POA: Diagnosis not present

## 2022-01-03 DIAGNOSIS — K2980 Duodenitis without bleeding: Secondary | ICD-10-CM

## 2022-01-03 DIAGNOSIS — D125 Benign neoplasm of sigmoid colon: Secondary | ICD-10-CM | POA: Diagnosis not present

## 2022-01-03 DIAGNOSIS — F419 Anxiety disorder, unspecified: Secondary | ICD-10-CM | POA: Diagnosis not present

## 2022-01-03 DIAGNOSIS — K635 Polyp of colon: Secondary | ICD-10-CM | POA: Diagnosis not present

## 2022-01-03 DIAGNOSIS — R69 Illness, unspecified: Secondary | ICD-10-CM | POA: Diagnosis not present

## 2022-01-03 DIAGNOSIS — D120 Benign neoplasm of cecum: Secondary | ICD-10-CM

## 2022-01-03 DIAGNOSIS — Z86010 Personal history of colonic polyps: Secondary | ICD-10-CM | POA: Diagnosis not present

## 2022-01-03 DIAGNOSIS — Z09 Encounter for follow-up examination after completed treatment for conditions other than malignant neoplasm: Secondary | ICD-10-CM

## 2022-01-03 DIAGNOSIS — F32A Depression, unspecified: Secondary | ICD-10-CM | POA: Diagnosis not present

## 2022-01-03 DIAGNOSIS — G4733 Obstructive sleep apnea (adult) (pediatric): Secondary | ICD-10-CM | POA: Diagnosis not present

## 2022-01-03 MED ORDER — SODIUM CHLORIDE 0.9 % IV SOLN: 500.0000 mL | Freq: Once | INTRAVENOUS | Status: DC

## 2022-01-03 NOTE — Progress Notes (Signed)
Report given to PACU, vss 

## 2022-01-03 NOTE — Progress Notes (Signed)
1109 Robinul 0.1 mg IV given due large amount of secretions upon assessment.  MD made aware, vss

## 2022-01-03 NOTE — Progress Notes (Signed)
Pt's states no medical or surgical changes since previsit or office visit. 

## 2022-01-03 NOTE — Op Note (Signed)
[place] Patient Name: Connie Garrett Procedure Date: 01/03/2022 11:10 AM MRN: [id number] Endoscopist: Remo Lipps P. Havery Moros , MD, [id number] Age: 62 Referring MD:  Date of Birth: Mar 14, 1960 Gender: Female Account #: [id number] Procedure:                Upper GI endoscopy Indications:              Suspected celiac disease - EGD done 11/2020 for                            dysphagia which incidentally showed some mild                            changes in the duodenum which was biopsied and                            returned suggestive of celiac disease. Patient has                            had a negative serologic evaluation for celiac                            disease, despite eating gluten. HLA DQ 2 positive.                            Currently eating gluten periodically, repeat EGD to                            clarify if the patient has celiac disease or not.                            Of note, on the last exam the patient had dysphagia                            and empiric dilation done which showed a mucosal                            wrent just inferior to the UES. The patient denies                            any significant dysphagia at this time, did not                            want a repeat dilation Medicines:                Monitored Anesthesia Care Procedure:                Pre-Anesthesia Assessment:                           - Prior to the procedure, a History and Physical                            was performed, and patient medications and  allergies were reviewed. The patient's tolerance of                            previous anesthesia was also reviewed. The risks                            and benefits of the procedure and the sedation                            options and risks were discussed with the patient.                            All questions were answered, and informed consent                            was  obtained. Prior Anticoagulants: The patient has                            taken no anticoagulant or antiplatelet agents. ASA                            Grade Assessment: II - A patient with mild systemic                            disease. After reviewing the risks and benefits,                            the patient was deemed in satisfactory condition to                            undergo the procedure.                           After obtaining informed consent, the endoscope was                            passed under direct vision. Throughout the                            procedure, the patient's blood pressure, pulse, and                            oxygen saturations were monitored continuously. The                            GIF [id number] #[id number] was introduced through the                            mouth, and advanced to the second part of duodenum.                            The upper GI endoscopy was accomplished without  difficulty. The patient tolerated the procedure                            well. Scope In: Scope Out: Findings:                 Esophagogastric landmarks were identified: the                            Z-line was found at 36 cm, the gastroesophageal                            junction was found at 36 cm and the upper extent of                            the gastric folds was found at 37 cm from the                            incisors.                           A 1 cm hiatal hernia was present.                           The exam of the esophagus was otherwise normal.                           The entire examined stomach was normal.                           Very mild suspected flattening was found in the                            duodenal bulb, and a few areas of mildly scalloped                            mucosa was found in the second portion of the                            duodenum. Changes were generally mild. Biopsies for                             histology were taken with a cold forceps for                            evaluation of celiac disease.                           The exam of the duodenum was otherwise normal. Complications:            No immediate complications. Estimated blood loss:                            Minimal. Estimated Blood Loss:     Estimated blood loss was minimal. Impression:               -  Esophagogastric landmarks identified.                           - 1 cm hiatal hernia.                           - Normal esophagus otherwise                           - Normal stomach.                           - Duodenal mucosal changes seen, very mild, raise                            question of possible mild celiac disease. Biopsied. Recommendation:           - Patient has a contact number available for                            emergencies. The signs and symptoms of potential                            delayed complications were discussed with the                            patient. Return to normal activities tomorrow.                            Written discharge instructions were provided to the                            patient.                           - Resume previous diet.                           - Continue present medications.                           - Await pathology results with further                            recommendations Remo Lipps P. Connie Govan, MD 01/03/2022 11:58:16 AM This report has been signed electronically.

## 2022-01-03 NOTE — Op Note (Signed)
[place] Patient Name: Connie Garrett Procedure Date: 01/03/2022 10:57 AM MRN: [id number] Endoscopist: Remo Lipps P. Havery Moros , MD, [id number] Age: 62 Referring MD:  Date of Birth: [birth year] Gender: Female Account #: [id number] Procedure:                Colonoscopy Indications:              High risk colon cancer surveillance: Personal                            history of colonic polyps - 11 adenomas removed                            11/2020 Medicines:                Monitored Anesthesia Care Procedure:                Pre-Anesthesia Assessment:                           - Prior to the procedure, a History and Physical                            was performed, and patient medications and                            allergies were reviewed. The patient's tolerance of                            previous anesthesia was also reviewed. The risks                            and benefits of the procedure and the sedation                            options and risks were discussed with the patient.                            All questions were answered, and informed consent                            was obtained. Prior Anticoagulants: The patient has                            taken no anticoagulant or antiplatelet agents. ASA                            Grade Assessment: II - A patient with mild systemic                            disease. After reviewing the risks and benefits,                            the patient was deemed in satisfactory condition to  undergo the procedure.                           After obtaining informed consent, the colonoscope                            was passed under direct vision. Throughout the                            procedure, the patient's blood pressure, pulse, and                            oxygen saturations were monitored continuously. The                            PCF-HQ190L Colonoscope was introduced through  the                            anus and advanced to the the cecum, identified by                            appendiceal orifice and ileocecal valve. The                            colonoscopy was performed without difficulty. The                            patient tolerated the procedure well. The quality                            of the bowel preparation was adequate. The                            ileocecal valve, appendiceal orifice, and rectum                            were photographed. Scope In: 11:25:34 AM Scope Out: 11:47:52 AM Scope Withdrawal Time: 0 hours 19 minutes 13 seconds  Total Procedure Duration: 0 hours 22 minutes 18 seconds  Findings:                 The perianal and digital rectal examinations were                            normal.                           A 3 mm polyp was found in the ileocecal valve. The                            polyp was sessile. The polyp was removed with a                            cold snare. Resection and retrieval were complete.  A 3 mm polyp was found in the ascending colon. The                            polyp was sessile. The polyp was removed with a                            cold snare. Resection was complete, but the polyp                            tissue was not retrieved.                           A 4 mm polyp was found in the sigmoid colon. The                            polyp was sessile. The polyp was removed with a                            cold snare. Resection and retrieval were complete.                           Multiple small-mouthed diverticula were found in                            the sigmoid colon.                           Internal hemorrhoids were found during                            retroflexion. The hemorrhoids were small.                           The exam was otherwise without abnormality. Complications:            No immediate complications. Estimated blood loss:                             Minimal. Estimated Blood Loss:     Estimated blood loss was minimal. Impression:               - One 3 mm polyp at the ileocecal valve, removed                            with a cold snare. Resected and retrieved.                           - One 3 mm polyp in the ascending colon, removed                            with a cold snare. Complete resection. Polyp tissue                            not retrieved.                           -  One 4 mm polyp in the sigmoid colon, removed with                            a cold snare. Resected and retrieved.                           - Diverticulosis in the sigmoid colon.                           - Internal hemorrhoids.                           - The examination was otherwise normal. Recommendation:           - Patient has a contact number available for                            emergencies. The signs and symptoms of potential                            delayed complications were discussed with the                            patient. Return to normal activities tomorrow.                            Written discharge instructions were provided to the                            patient.                           - Resume previous diet.                           - Continue present medications.                           - Await pathology results.                           - Anticipate repeat colonoscopy in 3 years given                            burden of polyps on the last exam in 2022 Silena Wyss P. Kaimana Neuzil, MD 01/03/2022 11:52:15 AM This report has been signed electronically.

## 2022-01-03 NOTE — Progress Notes (Signed)
[facility] History and Physical   Primary Care Physician:  Marco Collie, MD   Reason for Procedure:   Abnormal duodenal mucosa - possible celiac, history of colon polyps  Plan:    EGD and colonoscopy     HPI: Connie Garrett is a 62 y.o. female  here for EGD and colonoscopy to evaluate issues as below. See office note 10/17 for details. 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 She is not too compliant with gluten free diet. EGD to re-evaluate and see if mucosal changes persist. Also with 11 adenomas removed 11/2020. Patient has chronic constipation on Linzess. No family history of colon cancer known. Otherwise feels well without any cardiopulmonary symptoms.   I have discussed risks / benefits of anesthesia and endoscopic procedure with Connie Garrett and they wish to proceed with the exams as outlined today.    Past Medical History:  Diagnosis Date   Anxiety    Arthritis    Cataract    Celiac disease    suspected   Colon polyps    Depression    GERD (gastroesophageal reflux disease)    Hypertension    Osteoporosis    Sleep apnea     Past Surgical History:  Procedure Laterality Date   ABDOMINAL HYSTERECTOMY     BACK SURGERY  1990   colonosocpy     ESOPHAGOGASTRODUODENOSCOPY     EYE SURGERY     TOTAL KNEE ARTHROPLASTY Right 07/18/2018   Procedure: TOTAL KNEE ARTHROPLASTY;  Surgeon: Dorna Leitz, MD;  Location: [facility];  Service: Orthopedics;  Laterality: Right;   WRIST FRACTURE SURGERY Left    x 2    Prior to Admission medications   Medication Sig Start Date End Date Taking? Authorizing Provider  atorvastatin (LIPITOR) 20 MG tablet Take 20 mg by mouth at bedtime. 07/27/20  Yes [provider]  desloratadine (CLARINEX) 5 MG tablet Take 5 mg by mouth daily.   Yes [provider]  escitalopram (LEXAPRO) 20 MG tablet Take 20 mg by mouth daily.    Yes [provider]  ferrous sulfate 325 (65 FE) MG EC tablet Take 1 tablet (325 mg total) by mouth daily with breakfast. 01/30/21  Yes Lenzy Kerschner, Carlota Raspberry, MD  hydrochlorothiazide (HYDRODIURIL) 25 MG tablet Take 25 mg by mouth daily. 07/26/21  Yes [provider]  meloxicam (MOBIC) 15 MG tablet Take 15 mg by mouth daily.   Yes [provider]  metoprolol succinate (TOPROL-XL) 25 MG 24 hr tablet Take by mouth.   Yes [provider]  Multiple Vitamin (MULTIVITAMIN WITH MINERALS) TABS tablet Take 1 tablet by mouth daily.   Yes [provider]  omeprazole (PRILOSEC) 20 MG capsule Take 20 mg by mouth daily.   Yes [provider]  [facility] ULTRA test strip daily. 08/07/21  Yes [provider]  potassium chloride SA (KLOR-CON M) 20 MEQ tablet Take 20 mEq by mouth daily. 12/29/21  Yes [provider]  Sodium Sulfate-Mag Sulfate-KCl (SUTAB) [phone] MG TABS Use as directed 10/17/21  Yes Iyla Balzarini, Carlota Raspberry, MD  traZODone (DESYREL) 50 MG tablet Take 50 mg by mouth at bedtime.   Yes [provider]  venlafaxine (EFFEXOR) 75 MG tablet Take 75 mg by mouth 1 day or 1 dose.   Yes [provider]  buPROPion (WELLBUTRIN SR) 200 MG 12 hr tablet Take 200 mg by mouth. Patient not taking: Reported  on 10/17/2021 09/13/20   [provider]  dicyclomine (BENTYL) 10 MG capsule Take 1 capsule (10 mg total) by mouth 3 (three) times daily as needed for spasms (abdominal pain). 09/28/20   Noralyn Pick, NP  linaclotide (LINZESS) 290 MCG CAPS capsule Take 1 capsule (290 mcg total) by mouth every other day. Patient not taking: Reported on 01/03/2022 10/17/21   Trachelle Low, Carlota Raspberry, MD  Peppermint Oil (IBGARD) 90 MG CPCR Take 1 capsule by mouth daily at 2 PM. 10/17/21   Sakinah Rosamond, Carlota Raspberry, MD  potassium & sodium phosphates (PHOS-NAK) 280-160-250 MG PACK Take 1 packet by mouth 4 (four) times daily -  with meals and at  bedtime.    [provider]  tiZANidine (ZANAFLEX) 2 MG tablet Take 1 tablet (2 mg total) by mouth every 8 (eight) hours as needed for muscle spasms. Patient not taking: Reported on 10/17/2021 07/19/18   Grier Mitts, PA-C    Current Outpatient Medications  Medication Sig Dispense Refill   atorvastatin (LIPITOR) 20 MG tablet Take 20 mg by mouth at bedtime.     desloratadine (CLARINEX) 5 MG tablet Take 5 mg by mouth daily.     escitalopram (LEXAPRO) 20 MG tablet Take 20 mg by mouth daily.     ferrous sulfate 325 (65 FE) MG EC tablet Take 1 tablet (325 mg total) by mouth daily with breakfast. 30 tablet 0   hydrochlorothiazide (HYDRODIURIL) 25 MG tablet Take 25 mg by mouth daily.     meloxicam (MOBIC) 15 MG tablet Take 15 mg by mouth daily.     metoprolol succinate (TOPROL-XL) 25 MG 24 hr tablet Take by mouth.     Multiple Vitamin (MULTIVITAMIN WITH MINERALS) TABS tablet Take 1 tablet by mouth daily.     omeprazole (PRILOSEC) 20 MG capsule Take 20 mg by mouth daily.     ONETOUCH ULTRA test strip daily.     potassium chloride SA (KLOR-CON M) 20 MEQ tablet Take 20 mEq by mouth daily.     Sodium Sulfate-Mag Sulfate-KCl (SUTAB) [phone] MG TABS Use as directed 24 tablet 0   traZODone (DESYREL) 50 MG tablet Take 50 mg by mouth at bedtime.     venlafaxine (EFFEXOR) 75 MG tablet Take 75 mg by mouth 1 day or 1 dose.     buPROPion (WELLBUTRIN SR) 200 MG 12 hr tablet Take 200 mg by mouth. (Patient not taking: Reported on 10/17/2021)     dicyclomine (BENTYL) 10 MG capsule Take 1 capsule (10 mg total) by mouth 3 (three) times daily as needed for spasms (abdominal pain). 30 capsule 1   linaclotide (LINZESS) 290 MCG CAPS capsule Take 1 capsule (290 mcg total) by mouth every other day. (Patient not taking: Reported on 01/03/2022) 10 capsule 0   Peppermint Oil (IBGARD) 90 MG CPCR Take 1 capsule by mouth daily at 2 PM. 30 capsule 3   potassium & sodium phosphates (PHOS-NAK) 280-160-250 MG PACK  Take 1 packet by mouth 4 (four) times daily -  with meals and at bedtime.     tiZANidine (ZANAFLEX) 2 MG tablet Take 1 tablet (2 mg total) by mouth every 8 (eight) hours as needed for muscle spasms. (Patient not taking: Reported on 10/17/2021) 40 tablet 0   Current Facility-Administered Medications  Medication Dose Route Frequency Provider Last Rate Last Admin   0.9 %  sodium chloride infusion  500 mL Intravenous Once Skylor Schnapp, Carlota Raspberry, MD        Allergies as of 01/03/2022 -  Review Complete 01/03/2022  Allergen Reaction Noted   Anise extract [flavoring agent] Hives and Swelling 01/03/2022    Family History  Problem Relation Age of Onset   Diabetes Mother    Colon cancer Neg Hx    Colon polyps Neg Hx    Rectal cancer Neg Hx    Esophageal cancer Neg Hx    Stomach cancer Neg Hx     Social History   Socioeconomic History   Marital status: Single    Spouse name: Not on file   Number of children: 2   Years of education: Not on file   Highest education level: Not on file  Occupational History   Occupation: Homemaker  Tobacco Use   Smoking status: Never   Smokeless tobacco: Never  Vaping Use   Vaping Use: Never used  Substance and Sexual Activity   Alcohol use: Never   Drug use: Never   Sexual activity: Not Currently  Other Topics Concern   Not on file  Social History Narrative   Not on file   Social Determinants of Health   Financial Resource Strain: Not on file  Food Insecurity: No Food Insecurity (12/05/2020)   Hunger Vital Sign    Worried About Running Out of Food in the Last Year: Never true    Ran Out of Food in the Last Year: Never true  Transportation Needs: No Transportation Needs (12/05/2020)   PRAPARE - Hydrologist (Medical): No    Lack of Transportation (Non-Medical): No  Physical Activity: Not on file  Stress: Not on file  Social Connections: Not on file  Intimate Partner Violence: Not on file    Review of  Systems: All other review of systems negative except as mentioned in the HPI.  Physical Exam: Vital signs BP (!) 141/62   Pulse 88   Temp 98.1 F (36.7 C) (Other (Comment)) Comment (Src): forehead  Ht _0  (1.6 m)   Wt 189 lb (85.7 kg)   SpO2 98%   BMI 33.48 kg/m   General:   Alert,  Well-developed, pleasant and cooperative in NAD Lungs:  Clear throughout to auscultation.   Heart:  Regular rate and rhythm Abdomen:  Soft, nontender and nondistended.   Neuro/Psych:  Alert and cooperative. Normal mood and affect. A and O x 3  Jolly Mango, MD [facility]

## 2022-01-03 NOTE — Progress Notes (Signed)
1120 HR > 100 with esmolol 25 mg given IV, MD updated, vss

## 2022-01-03 NOTE — Progress Notes (Signed)
Called to room to assist during endoscopic procedure.  Patient ID and intended procedure confirmed with present staff. Received instructions for my participation in the procedure from the performing physician.  

## 2022-01-03 NOTE — Patient Instructions (Addendum)
Await pathology results.  Resume previous diet and medications.  Handouts on polyps and hiatal hernia given to you today.  Anticipate repeat colonoscopy in 3 years.    YOU HAD AN ENDOSCOPIC PROCEDURE TODAY AT [facility]:   Refer to the procedure report that was given to you for any specific questions about what was found during the examination.  If the procedure report does not answer your questions, please call your gastroenterologist to clarify.  If you requested that your care partner not be given the details of your procedure findings, then the procedure report has been included in a sealed envelope for you to review at your convenience later.  YOU SHOULD EXPECT: Some feelings of bloating in the abdomen. Passage of more gas than usual.  Walking can help get rid of the air that was put into your GI tract during the procedure and reduce the bloating. If you had a lower endoscopy (such as a colonoscopy or flexible sigmoidoscopy) you may notice spotting of blood in your stool or on the toilet paper. If you underwent a bowel prep for your procedure, you may not have a normal bowel movement for a few days.  Please Note:  You might notice some irritation and congestion in your nose or some drainage.  This is from the oxygen used during your procedure.  There is no need for concern and it should clear up in a day or so.  SYMPTOMS TO REPORT IMMEDIATELY:  Following lower endoscopy (colonoscopy or flexible sigmoidoscopy):  Excessive amounts of blood in the stool  Significant tenderness or worsening of abdominal pains  Swelling of the abdomen that is new, acute  Fever of 100F or higher  Following upper endoscopy (EGD)  Vomiting of blood or coffee ground material  New chest pain or pain under the shoulder blades  Painful or persistently difficult swallowing  New shortness of breath  Fever of 100F or higher  Black, tarry-looking stools  For urgent or emergent issues, a  gastroenterologist can be reached at any hour by calling [phone]. Do not use MyChart messaging for urgent concerns.    DIET:  We do recommend a small meal at first, but then you may proceed to your regular diet.  Drink plenty of fluids but you should avoid alcoholic beverages for 24 hours.  ACTIVITY:  You should plan to take it easy for the rest of today and you should NOT DRIVE or use heavy machinery until tomorrow (because of the sedation medicines used during the test).    FOLLOW UP: Our staff will call the number listed on your records the next business day following your procedure.  We will call around 7:15- 8:00 am to check on you and address any questions or concerns that you may have regarding the information given to you following your procedure. If we do not reach you, we will leave a message.     If any biopsies were taken you will be contacted by phone or by letter within the next 1-3 weeks.  Please call us at [phone] if you have not heard about the biopsies in 3 weeks.    SIGNATURES/CONFIDENTIALITY: You and/or your care partner have signed paperwork which will be entered into your electronic medical record.  These signatures attest to the fact that that the information above on your After Visit Summary has been reviewed and is understood.  Full responsibility of the confidentiality of this discharge information lies with you and/or your care-partner.

## 2022-01-04 ENCOUNTER — Telehealth: Payer: Self-pay | Admitting: *Deleted

## 2022-01-04 ENCOUNTER — Telehealth: Payer: Self-pay

## 2022-01-04 NOTE — Telephone Encounter (Signed)
Inbound call from patient stating she is running a fever. Pease advise.

## 2022-01-04 NOTE — Telephone Encounter (Signed)
Called patient back after her phone call and said her temperature is right at 100.0, she has no other symptoms and is drinking sprite at the moment. I encouraged her to keep an eye on her temperature and maybe try taking it again in an hour or so. She agreed. I also advised her to contact her PCP if it gets higher or if she develops any other flu like symptoms.

## 2022-01-04 NOTE — Telephone Encounter (Signed)
Thanks Estill Bamberg, agree with your recommendation, I do not think temperature would be related to her procedures yesterday, no high risk interventions done, especially if she is otherwise feeling okay.  Thanks

## 2022-01-04 NOTE — Telephone Encounter (Signed)
  Follow up Call-     01/03/2022   10:29 AM 11/11/2020    9:42 AM  Call back number  Post procedure Call Back phone  # [phone]  Permission to leave phone message Yes Yes     Patient questions:  Do you have a fever, pain , or abdominal swelling? No. Pain Score  0 *  Have you tolerated food without any problems? Yes.    Have you been able to return to your normal activities? Yes.    Do you have any questions about your discharge instructions: Diet   No. Medications  No. Follow up visit  No.  Do you have questions or concerns about your Care? No.  Actions: * If pain score is 4 or above: No action needed, pain <4.

## 2022-01-05 DIAGNOSIS — R0600 Dyspnea, unspecified: Secondary | ICD-10-CM | POA: Diagnosis not present

## 2022-01-05 DIAGNOSIS — J4530 Mild persistent asthma, uncomplicated: Secondary | ICD-10-CM | POA: Diagnosis not present

## 2022-01-05 DIAGNOSIS — G4733 Obstructive sleep apnea (adult) (pediatric): Secondary | ICD-10-CM | POA: Diagnosis not present

## 2022-01-05 DIAGNOSIS — R918 Other nonspecific abnormal finding of lung field: Secondary | ICD-10-CM | POA: Diagnosis not present

## 2022-01-05 DIAGNOSIS — R400 Somnolence: Secondary | ICD-10-CM | POA: Diagnosis not present

## 2022-01-05 DIAGNOSIS — R5383 Other fatigue: Secondary | ICD-10-CM | POA: Diagnosis not present

## 2022-01-08 ENCOUNTER — Telehealth: Payer: Self-pay | Admitting: Gastroenterology

## 2022-01-08 NOTE — Telephone Encounter (Unsigned)
Left message for pt to call back  °

## 2022-01-08 NOTE — Telephone Encounter (Signed)
Patient had a procedure on 1/3. Stated since her procedure she has not had a bowel movement and feels very bloated. Patient requesting a nurse to call her to further advise. Thank you.

## 2022-01-09 NOTE — Telephone Encounter (Signed)
Returned call. I told pt that it is expected for her to not have a BM for a little time after a colonoscopy. Patient states that she was able to pass a small amount of stool. I told pt that she could try adding Miralax daily to see if that helps. Pt also reports bloating and some abdominal pain. Pt has been advised to try Miralax for a few days and can take Gas-X PRN for a few days. Pt has been advised to call back if no improvement in bloating or if she has worsening abdominal pain. Pt verbalized understanding and had no concerns at the end of the call.

## 2022-01-16 DIAGNOSIS — I10 Essential (primary) hypertension: Secondary | ICD-10-CM | POA: Diagnosis not present

## 2022-01-16 DIAGNOSIS — R0600 Dyspnea, unspecified: Secondary | ICD-10-CM | POA: Diagnosis not present

## 2022-01-16 DIAGNOSIS — G4733 Obstructive sleep apnea (adult) (pediatric): Secondary | ICD-10-CM | POA: Diagnosis not present

## 2022-02-12 DIAGNOSIS — U071 COVID-19: Secondary | ICD-10-CM | POA: Diagnosis not present

## 2022-02-12 DIAGNOSIS — R059 Cough, unspecified: Secondary | ICD-10-CM | POA: Diagnosis not present

## 2022-03-01 DIAGNOSIS — R5383 Other fatigue: Secondary | ICD-10-CM | POA: Diagnosis not present

## 2022-03-01 DIAGNOSIS — G4733 Obstructive sleep apnea (adult) (pediatric): Secondary | ICD-10-CM | POA: Diagnosis not present

## 2022-03-01 DIAGNOSIS — R0600 Dyspnea, unspecified: Secondary | ICD-10-CM | POA: Diagnosis not present

## 2022-03-01 DIAGNOSIS — R918 Other nonspecific abnormal finding of lung field: Secondary | ICD-10-CM | POA: Diagnosis not present

## 2022-03-01 DIAGNOSIS — J4530 Mild persistent asthma, uncomplicated: Secondary | ICD-10-CM | POA: Diagnosis not present

## 2022-03-01 DIAGNOSIS — R400 Somnolence: Secondary | ICD-10-CM | POA: Diagnosis not present

## 2022-03-08 DIAGNOSIS — E1169 Type 2 diabetes mellitus with other specified complication: Secondary | ICD-10-CM | POA: Diagnosis not present

## 2022-03-13 DIAGNOSIS — R300 Dysuria: Secondary | ICD-10-CM | POA: Diagnosis not present

## 2022-03-13 DIAGNOSIS — Z6832 Body mass index (BMI) 32.0-32.9, adult: Secondary | ICD-10-CM | POA: Diagnosis not present

## 2022-03-13 DIAGNOSIS — E1169 Type 2 diabetes mellitus with other specified complication: Secondary | ICD-10-CM | POA: Diagnosis not present

## 2022-03-13 DIAGNOSIS — N390 Urinary tract infection, site not specified: Secondary | ICD-10-CM | POA: Diagnosis not present

## 2022-03-13 DIAGNOSIS — E785 Hyperlipidemia, unspecified: Secondary | ICD-10-CM | POA: Diagnosis not present

## 2022-03-13 DIAGNOSIS — M549 Dorsalgia, unspecified: Secondary | ICD-10-CM | POA: Diagnosis not present

## 2022-03-15 DIAGNOSIS — Z6832 Body mass index (BMI) 32.0-32.9, adult: Secondary | ICD-10-CM | POA: Diagnosis not present

## 2022-03-15 DIAGNOSIS — E785 Hyperlipidemia, unspecified: Secondary | ICD-10-CM | POA: Diagnosis not present

## 2022-03-15 DIAGNOSIS — E1169 Type 2 diabetes mellitus with other specified complication: Secondary | ICD-10-CM | POA: Diagnosis not present

## 2022-03-15 DIAGNOSIS — I10 Essential (primary) hypertension: Secondary | ICD-10-CM | POA: Diagnosis not present

## 2022-03-15 DIAGNOSIS — R69 Illness, unspecified: Secondary | ICD-10-CM | POA: Diagnosis not present

## 2022-03-19 DIAGNOSIS — R918 Other nonspecific abnormal finding of lung field: Secondary | ICD-10-CM | POA: Diagnosis not present

## 2022-03-19 DIAGNOSIS — J9811 Atelectasis: Secondary | ICD-10-CM | POA: Diagnosis not present

## 2022-03-21 DIAGNOSIS — R5383 Other fatigue: Secondary | ICD-10-CM | POA: Diagnosis not present

## 2022-03-21 DIAGNOSIS — G4733 Obstructive sleep apnea (adult) (pediatric): Secondary | ICD-10-CM | POA: Diagnosis not present

## 2022-03-21 DIAGNOSIS — R0600 Dyspnea, unspecified: Secondary | ICD-10-CM | POA: Diagnosis not present

## 2022-03-21 DIAGNOSIS — J4530 Mild persistent asthma, uncomplicated: Secondary | ICD-10-CM | POA: Diagnosis not present

## 2022-03-21 DIAGNOSIS — R918 Other nonspecific abnormal finding of lung field: Secondary | ICD-10-CM | POA: Diagnosis not present

## 2022-03-21 DIAGNOSIS — R400 Somnolence: Secondary | ICD-10-CM | POA: Diagnosis not present

## 2022-04-02 DIAGNOSIS — R0600 Dyspnea, unspecified: Secondary | ICD-10-CM | POA: Diagnosis not present

## 2022-04-04 DIAGNOSIS — R918 Other nonspecific abnormal finding of lung field: Secondary | ICD-10-CM | POA: Diagnosis not present

## 2022-04-04 DIAGNOSIS — R5383 Other fatigue: Secondary | ICD-10-CM | POA: Diagnosis not present

## 2022-04-04 DIAGNOSIS — R400 Somnolence: Secondary | ICD-10-CM | POA: Diagnosis not present

## 2022-04-04 DIAGNOSIS — E668 Other obesity: Secondary | ICD-10-CM | POA: Diagnosis not present

## 2022-04-04 DIAGNOSIS — J4530 Mild persistent asthma, uncomplicated: Secondary | ICD-10-CM | POA: Diagnosis not present

## 2022-04-04 DIAGNOSIS — R0600 Dyspnea, unspecified: Secondary | ICD-10-CM | POA: Diagnosis not present

## 2022-04-04 DIAGNOSIS — G4733 Obstructive sleep apnea (adult) (pediatric): Secondary | ICD-10-CM | POA: Diagnosis not present

## 2022-04-18 DIAGNOSIS — J4520 Mild intermittent asthma, uncomplicated: Secondary | ICD-10-CM | POA: Diagnosis not present

## 2022-04-25 DIAGNOSIS — R5383 Other fatigue: Secondary | ICD-10-CM | POA: Diagnosis not present

## 2022-04-25 DIAGNOSIS — R918 Other nonspecific abnormal finding of lung field: Secondary | ICD-10-CM | POA: Diagnosis not present

## 2022-04-25 DIAGNOSIS — J301 Allergic rhinitis due to pollen: Secondary | ICD-10-CM | POA: Diagnosis not present

## 2022-04-25 DIAGNOSIS — J4520 Mild intermittent asthma, uncomplicated: Secondary | ICD-10-CM | POA: Diagnosis not present

## 2022-04-25 DIAGNOSIS — G4733 Obstructive sleep apnea (adult) (pediatric): Secondary | ICD-10-CM | POA: Diagnosis not present

## 2022-04-25 DIAGNOSIS — R400 Somnolence: Secondary | ICD-10-CM | POA: Diagnosis not present

## 2022-04-25 DIAGNOSIS — R0600 Dyspnea, unspecified: Secondary | ICD-10-CM | POA: Diagnosis not present

## 2022-05-02 ENCOUNTER — Ambulatory Visit: Payer: Medicare HMO | Admitting: Orthopaedic Surgery

## 2022-05-02 DIAGNOSIS — G4733 Obstructive sleep apnea (adult) (pediatric): Secondary | ICD-10-CM | POA: Diagnosis not present

## 2022-05-08 DIAGNOSIS — M1712 Unilateral primary osteoarthritis, left knee: Secondary | ICD-10-CM | POA: Diagnosis not present

## 2022-05-11 DIAGNOSIS — R300 Dysuria: Secondary | ICD-10-CM | POA: Diagnosis not present

## 2022-05-11 DIAGNOSIS — Z6833 Body mass index (BMI) 33.0-33.9, adult: Secondary | ICD-10-CM | POA: Diagnosis not present

## 2022-05-11 DIAGNOSIS — N952 Postmenopausal atrophic vaginitis: Secondary | ICD-10-CM | POA: Diagnosis not present

## 2022-05-21 DIAGNOSIS — Z1231 Encounter for screening mammogram for malignant neoplasm of breast: Secondary | ICD-10-CM | POA: Diagnosis not present

## 2022-05-23 DIAGNOSIS — Z01818 Encounter for other preprocedural examination: Secondary | ICD-10-CM | POA: Diagnosis not present

## 2022-05-23 DIAGNOSIS — Z6833 Body mass index (BMI) 33.0-33.9, adult: Secondary | ICD-10-CM | POA: Diagnosis not present

## 2022-05-23 DIAGNOSIS — M25562 Pain in left knee: Secondary | ICD-10-CM | POA: Diagnosis not present

## 2022-05-23 DIAGNOSIS — Z1231 Encounter for screening mammogram for malignant neoplasm of breast: Secondary | ICD-10-CM | POA: Diagnosis not present

## 2022-05-25 DIAGNOSIS — R5383 Other fatigue: Secondary | ICD-10-CM | POA: Diagnosis not present

## 2022-05-25 DIAGNOSIS — J4520 Mild intermittent asthma, uncomplicated: Secondary | ICD-10-CM | POA: Diagnosis not present

## 2022-05-25 DIAGNOSIS — R400 Somnolence: Secondary | ICD-10-CM | POA: Diagnosis not present

## 2022-05-25 DIAGNOSIS — G4733 Obstructive sleep apnea (adult) (pediatric): Secondary | ICD-10-CM | POA: Diagnosis not present

## 2022-05-25 DIAGNOSIS — J301 Allergic rhinitis due to pollen: Secondary | ICD-10-CM | POA: Diagnosis not present

## 2022-05-25 DIAGNOSIS — R918 Other nonspecific abnormal finding of lung field: Secondary | ICD-10-CM | POA: Diagnosis not present

## 2022-05-25 DIAGNOSIS — R0600 Dyspnea, unspecified: Secondary | ICD-10-CM | POA: Diagnosis not present

## 2022-06-11 ENCOUNTER — Encounter (HOSPITAL_COMMUNITY): Payer: Self-pay

## 2022-06-11 ENCOUNTER — Other Ambulatory Visit: Payer: Self-pay | Admitting: Orthopedic Surgery

## 2022-06-14 DIAGNOSIS — Z0000 Encounter for general adult medical examination without abnormal findings: Secondary | ICD-10-CM | POA: Diagnosis not present

## 2022-06-14 DIAGNOSIS — Z139 Encounter for screening, unspecified: Secondary | ICD-10-CM | POA: Diagnosis not present

## 2022-06-14 DIAGNOSIS — G4733 Obstructive sleep apnea (adult) (pediatric): Secondary | ICD-10-CM | POA: Diagnosis not present

## 2022-06-14 DIAGNOSIS — Z1389 Encounter for screening for other disorder: Secondary | ICD-10-CM | POA: Diagnosis not present

## 2022-06-14 DIAGNOSIS — Z1331 Encounter for screening for depression: Secondary | ICD-10-CM | POA: Diagnosis not present

## 2022-06-14 DIAGNOSIS — F331 Major depressive disorder, recurrent, moderate: Secondary | ICD-10-CM | POA: Diagnosis not present

## 2022-06-14 DIAGNOSIS — Z136 Encounter for screening for cardiovascular disorders: Secondary | ICD-10-CM | POA: Diagnosis not present

## 2022-06-14 DIAGNOSIS — Z6834 Body mass index (BMI) 34.0-34.9, adult: Secondary | ICD-10-CM | POA: Diagnosis not present

## 2022-06-14 DIAGNOSIS — Z1339 Encounter for screening examination for other mental health and behavioral disorders: Secondary | ICD-10-CM | POA: Diagnosis not present

## 2022-06-27 ENCOUNTER — Encounter (HOSPITAL_COMMUNITY): Admission: RE | Admit: 2022-06-27 | Payer: Medicare HMO | Source: Ambulatory Visit

## 2022-07-02 DIAGNOSIS — M1712 Unilateral primary osteoarthritis, left knee: Secondary | ICD-10-CM | POA: Diagnosis not present

## 2022-07-02 NOTE — Patient Instructions (Signed)
DUE TO COVID-19 ONLY TWO VISITORS  (aged 60 and older)  ARE ALLOWED TO COME WITH YOU AND STAY IN THE WAITING ROOM ONLY DURING PRE OP AND PROCEDURE.   **NO VISITORS ARE ALLOWED IN THE SHORT STAY AREA OR RECOVERY ROOM!!**  IF YOU WILL BE ADMITTED INTO THE HOSPITAL YOU ARE ALLOWED ONLY FOUR SUPPORT PEOPLE DURING VISITATION HOURS ONLY (7 AM -8PM)   The support person(s) must pass our screening, gel in and out, and wear a mask at all times, including in the patient's room. Patients must also wear a mask when staff or their support person are in the room. Visitors GUEST BADGE MUST BE WORN VISIBLY  One adult visitor may remain with you overnight and MUST be in the room by 8 P.M.     Your procedure is scheduled on: 07/09/22   Report to [facility] Main Entrance    Report to admitting at : 9:20 AM   Call this number if you have problems the morning of surgery [phone]   Do not eat food :After Midnight.   After Midnight you may have the following liquids until : 8:45 AM DAY OF SURGERY  Water Black Coffee (sugar ok, NO MILK/CREAM OR CREAMERS)  Tea (sugar ok, NO MILK/CREAM OR CREAMERS) regular and decaf                             Plain Jell-O (NO RED)                                           Fruit ices (not with fruit pulp, NO RED)                                     Popsicles (NO RED)                                                                  Juice: apple, WHITE grape, WHITE cranberry Sports drinks like Gatorade (NO RED)   The day of surgery:  Drink ONE (1) Pre-Surgery Clear Ensure at : 8:45 AM the morning of surgery. Drink in one sitting. Do not sip.  This drink was given to you during your hospital  pre-op appointment visit. Nothing else to drink after completing the  Pre-Surgery Clear Ensure or G2.          If you have questions, please contact your surgeon's office.  Oral Hygiene is also important to reduce your risk of infection.                                     Remember - BRUSH YOUR TEETH THE MORNING OF SURGERY WITH YOUR REGULAR TOOTHPASTE  DENTURES WILL BE REMOVED PRIOR TO SURGERY PLEASE DO NOT APPLY "Poly grip" OR ADHESIVES!!!   Do NOT smoke after Midnight   Take these medicines the morning of surgery with A SIP OF WATER: bupropion,venlafaxine,metoprolol.  DO NOT TAKE ANY ORAL DIABETIC MEDICATIONS  DAY OF YOUR SURGERY  Bring CPAP mask and tubing day of surgery.                              You may not have any metal on your body including hair pins, jewelry, and body piercing             Do not wear make-up, lotions, powders, perfumes/cologne, or deodorant  Do not wear nail polish including gel and S&S, artificial/acrylic nails, or any other type of covering on natural nails including finger and toenails. If you have artificial nails, gel coating, etc. that needs to be removed by a nail salon please have this removed prior to surgery or surgery may need to be canceled/ delayed if the surgeon/ anesthesia feels like they are unable to be safely monitored.   Do not shave  48 hours prior to surgery.    Do not bring valuables to the hospital. [facility] IS NOT             RESPONSIBLE   FOR VALUABLES.   Contacts, glasses, or bridgework may not be worn into surgery.   Bring small overnight bag day of surgery.   DO NOT BRING YOUR HOME MEDICATIONS TO THE HOSPITAL. PHARMACY WILL DISPENSE MEDICATIONS LISTED ON YOUR MEDICATION LIST TO YOU DURING YOUR ADMISSION IN THE HOSPITAL!    Patients discharged on the day of surgery will not be allowed to drive home.  Someone NEEDS to stay with you for the first 24 hours after anesthesia.   Special Instructions: Bring a copy of your healthcare power of attorney and living will documents         the day of surgery if you haven't scanned them before.              Please read over the following fact sheets you were given: IF YOU HAVE QUESTIONS ABOUT YOUR PRE-OP INSTRUCTIONS PLEASE CALL [phone]       Pre-operative 5 CHG Bath Instructions   You can play a key role in reducing the risk of infection after surgery. Your skin needs to be as free of germs as possible. You can reduce the number of germs on your skin by washing with CHG (chlorhexidine gluconate) soap before surgery. CHG is an antiseptic soap that kills germs and continues to kill germs even after washing.   DO NOT use if you have an allergy to chlorhexidine/CHG or antibacterial soaps. If your skin becomes reddened or irritated, stop using the CHG and notify one of our RNs at : [phone].   Please shower with the CHG soap starting 4 days before surgery using the following schedule:     Please keep in mind the following:  DO NOT shave, including legs and underarms, starting the day of your first shower.   You may shave your face at any point before/day of surgery.  Place clean sheets on your bed the day you start using CHG soap. Use a clean washcloth (not used since being washed) for each shower. DO NOT sleep with pets once you start using the CHG.   CHG Shower Instructions:  If you choose to wash your hair and private area, wash first with your normal shampoo/soap.  After you use shampoo/soap, rinse your hair and body thoroughly to remove shampoo/soap residue.  Turn the water OFF and apply about 3 tablespoons (45 ml) of CHG soap to a CLEAN washcloth.  Apply  CHG soap ONLY FROM YOUR NECK DOWN TO YOUR TOES (washing for 3-5 minutes)  DO NOT use CHG soap on face, private areas, open wounds, or sores.  Pay special attention to the area where your surgery is being performed.  If you are having back surgery, having someone wash your back for you may be helpful. Wait 2 minutes after CHG soap is applied, then you may rinse off the CHG soap.  Pat dry with a clean towel  Put on clean clothes/pajamas   If you choose to wear lotion, please use ONLY the CHG-compatible lotions on the back of this paper.     Additional instructions  for the day of surgery: DO NOT APPLY any lotions, deodorants, cologne, or perfumes.   Put on clean/comfortable clothes.  Brush your teeth.  Ask your nurse before applying any prescription medications to the skin.      CHG Compatible Lotions   Aveeno Moisturizing lotion  Cetaphil Moisturizing Cream  Cetaphil Moisturizing Lotion  Clairol Herbal Essence Moisturizing Lotion, Dry Skin  Clairol Herbal Essence Moisturizing Lotion, Extra Dry Skin  Clairol Herbal Essence Moisturizing Lotion, Normal Skin  Curel Age Defying Therapeutic Moisturizing Lotion with Alpha Hydroxy  Curel Extreme Care Body Lotion  Curel Soothing Hands Moisturizing Hand Lotion  Curel Therapeutic Moisturizing Cream, Fragrance-Free  Curel Therapeutic Moisturizing Lotion, Fragrance-Free  Curel Therapeutic Moisturizing Lotion, Original Formula  Eucerin Daily Replenishing Lotion  Eucerin Dry Skin Therapy Plus Alpha Hydroxy Crme  Eucerin Dry Skin Therapy Plus Alpha Hydroxy Lotion  Eucerin Original Crme  Eucerin Original Lotion  Eucerin Plus Crme Eucerin Plus Lotion  Eucerin TriLipid Replenishing Lotion  Keri Anti-Bacterial Hand Lotion  Keri Deep Conditioning Original Lotion Dry Skin Formula Softly Scented  Keri Deep Conditioning Original Lotion, Fragrance Free Sensitive Skin Formula  Keri Lotion Fast Absorbing Fragrance Free Sensitive Skin Formula  Keri Lotion Fast Absorbing Softly Scented Dry Skin Formula  Keri Original Lotion  Keri Skin Renewal Lotion Keri Silky Smooth Lotion  Keri Silky Smooth Sensitive Skin Lotion  Nivea Body Creamy Conditioning Oil  Nivea Body Extra Enriched Lotion  Nivea Body Original Lotion  Nivea Body Sheer Moisturizing Lotion Nivea Crme  Nivea Skin Firming Lotion  NutraDerm 30 Skin Lotion  NutraDerm Skin Lotion  NutraDerm Therapeutic Skin Cream  NutraDerm Therapeutic Skin Lotion  ProShield Protective Hand Cream  Provon moisturizing lotion   Incentive Spirometer  An incentive  spirometer is a tool that can help keep your lungs clear and active. This tool measures how well you are filling your lungs with each breath. Taking long deep breaths may help reverse or decrease the chance of developing breathing (pulmonary) problems (especially infection) following: A long period of time when you are unable to move or be active. BEFORE THE PROCEDURE  If the spirometer includes an indicator to show your best effort, your nurse or respiratory therapist will set it to a desired goal. If possible, sit up straight or lean slightly forward. Try not to slouch. Hold the incentive spirometer in an upright position. INSTRUCTIONS FOR USE  Sit on the edge of your bed if possible, or sit up as far as you can in bed or on a chair. Hold the incentive spirometer in an upright position. Breathe out normally. Place the mouthpiece in your mouth and seal your lips tightly around it. Breathe in slowly and as deeply as possible, raising the piston or the ball toward the top of the column. Hold your breath for 3-5 seconds or for as  long as possible. Allow the piston or ball to fall to the bottom of the column. Remove the mouthpiece from your mouth and breathe out normally. Rest for a few seconds and repeat Steps 1 through 7 at least 10 times every 1-2 hours when you are awake. Take your time and take a few normal breaths between deep breaths. The spirometer may include an indicator to show your best effort. Use the indicator as a goal to work toward during each repetition. After each set of 10 deep breaths, practice coughing to be sure your lungs are clear. If you have an incision (the cut made at the time of surgery), support your incision when coughing by placing a pillow or rolled up towels firmly against it. Once you are able to get out of bed, walk around indoors and cough well. You may stop using the incentive spirometer when instructed by your caregiver.  RISKS AND COMPLICATIONS Take your time  so you do not get dizzy or light-headed. If you are in pain, you may need to take or ask for pain medication before doing incentive spirometry. It is harder to take a deep breath if you are having pain. AFTER USE Rest and breathe slowly and easily. It can be helpful to keep track of a log of your progress. Your caregiver can provide you with a simple table to help with this. If you are using the spirometer at home, follow these instructions: SEEK MEDICAL CARE IF:  You are having difficultly using the spirometer. You have trouble using the spirometer as often as instructed. Your pain medication is not giving enough relief while using the spirometer. You develop fever of 100.5 F (38.1 C) or higher. SEEK IMMEDIATE MEDICAL CARE IF:  You cough up bloody sputum that had not been present before. You develop fever of 102 F (38.9 C) or greater. You develop worsening pain at or near the incision site. MAKE SURE YOU:  Understand these instructions. Will watch your condition. Will get help right away if you are not doing well or get worse. Document Released: 04/30/2006 Document Revised: 03/12/2011 Document Reviewed: 07/01/2006 [facility] Patient Information 2014 [place], [place].   ________________________________________________________________________

## 2022-07-03 ENCOUNTER — Other Ambulatory Visit: Payer: Self-pay

## 2022-07-03 ENCOUNTER — Encounter (HOSPITAL_COMMUNITY): Payer: Self-pay

## 2022-07-03 ENCOUNTER — Ambulatory Visit (HOSPITAL_COMMUNITY)
Admission: RE | Admit: 2022-07-03 | Discharge: 2022-07-03 | Disposition: A | Discharge status: Home / Self Care | Payer: Medicare HMO | Source: Ambulatory Visit | Attending: Orthopedic Surgery | Admitting: Orthopedic Surgery

## 2022-07-03 ENCOUNTER — Encounter (HOSPITAL_COMMUNITY)
Admission: RE | Admit: 2022-07-03 | Discharge: 2022-07-03 | Disposition: A | Discharge status: Home / Self Care | Payer: Medicare HMO | Source: Ambulatory Visit | Attending: Orthopedic Surgery | Admitting: Orthopedic Surgery

## 2022-07-03 VITALS — BP 132/89 | HR 67 | Temp 98.1°F | Ht 65.0 in | Wt 204.0 lb

## 2022-07-03 DIAGNOSIS — Z01818 Encounter for other preprocedural examination: Secondary | ICD-10-CM | POA: Insufficient documentation

## 2022-07-03 DIAGNOSIS — I10 Essential (primary) hypertension: Secondary | ICD-10-CM | POA: Diagnosis not present

## 2022-07-03 HISTORY — DX: Prediabetes: R73.03

## 2022-07-03 LAB — CBC
HCT: 38.7 % (ref 36.0–46.0)
Hemoglobin: 12.7 g/dL (ref 12.0–15.0)
MCH: 29.3 pg (ref 26.0–34.0)
MCHC: 32.8 g/dL (ref 30.0–36.0)
MCV: 89.2 fL (ref 80.0–100.0)
Platelets: 290 10*3/uL (ref 150–400)
RBC: 4.34 MIL/uL (ref 3.87–5.11)
RDW: 12.8 % (ref 11.5–15.5)
WBC: 8.4 10*3/uL (ref 4.0–10.5)
nRBC: 0 % (ref 0.0–0.2)

## 2022-07-03 LAB — SURGICAL PCR SCREEN
MRSA, PCR: NEGATIVE
Staphylococcus aureus: NEGATIVE

## 2022-07-03 LAB — BASIC METABOLIC PANEL
Anion gap: 10 (ref 5–15)
BUN: 21 mg/dL — ABNORMAL HIGH (ref 6–20)
CO2: 27 mmol/L (ref 22–32)
Calcium: 9.9 mg/dL (ref 8.9–10.3)
Chloride: 100 mmol/L (ref 98–111)
Creatinine, Ser: 1.01 mg/dL — ABNORMAL HIGH (ref 0.44–1.00)
GFR, Estimated: >60 mL/min (ref >60)
Glucose, Bld: 100 mg/dL — ABNORMAL HIGH (ref 70–99)
Potassium: 4.5 mmol/L (ref 3.5–5.1)
Sodium: 137 mmol/L (ref 135–145)

## 2022-07-03 NOTE — Progress Notes (Signed)
For Short Stay: COVID SWAB appointment date:  Bowel Prep reminder:   For Anesthesia: PCP - Dr. Abner Greenspan Cardiologist - N/A  Chest x-ray - 07/03/22 EKG - 07/03/22 Stress Test -  ECHO - 10/17/20 Cardiac Cath -  Pacemaker/ICD device last checked: Pacemaker orders received: Device Rep notified:  Spinal Cord Stimulator: N/A  Sleep Study - Yes CPAP - Yes  Fasting Blood Sugar - N/A Checks Blood Sugar _____ times a day Date and result of last Hgb A1c-  Last dose of GLP1 agonist- N/A GLP1 instructions:   Last dose of SGLT-2 inhibitors- N/A SGLT-2 instructions:   Blood Thinner Instructions: N/A Aspirin Instructions: Last Dose:  Activity level: Can go up a flight of stairs and activities of daily living without stopping and without chest pain and/or shortness of breath   Able to exercise without chest pain and/or shortness of breath  Anesthesia review: Hx: HTN,OSA(CPAP)  Patient denies shortness of breath, fever, cough and chest pain at PAT appointment   Patient verbalized understanding of instructions that were given to them at the PAT appointment. Patient was also instructed that they will need to review over the PAT instructions again at home before surgery.

## 2022-07-04 NOTE — Care Plan (Signed)
Ortho Bundle Case Management Note  Patient Details  Name: Connie Garrett MRN: [id number] Date of Birth: 05/14/1960    met with patient and daughter in the office for H&P. she will discharge to home with family to assist. rolling walker ordered. OPPT set up with [facility]. discharge instructions discussed and questions answered. appointments confirmed. Patient and MD in agreement with plan. Choice offered                  DME Arranged:  Walker rolling DME Agency:  [employer]  HH Arranged:    HH Agency:     Additional Comments: Please contact me with any questions of if this plan should need to change.  Shauna Hugh,  RN,BSN,MHA,CCM  [facility]  [phone] 07/04/2022, 12:54 PM

## 2022-07-06 ENCOUNTER — Encounter: Payer: Self-pay | Admitting: Gastroenterology

## 2022-07-06 DIAGNOSIS — M1712 Unilateral primary osteoarthritis, left knee: Secondary | ICD-10-CM | POA: Diagnosis not present

## 2022-07-06 NOTE — H&P (Signed)
TOTAL KNEE ADMISSION H&P  Patient is being admitted for left total knee arthroplasty.  Subjective:  Chief Complaint:left knee pain.  HPI: Connie Garrett, 62 y.o. female, has a history of pain and functional disability in the left knee due to arthritis and has failed non-surgical conservative treatments for greater than 12 weeks to includeNSAID's and/or analgesics, corticosteriod injections, flexibility and strengthening excercises, weight reduction as appropriate, and activity modification.  Onset of symptoms was gradual, starting 3 years ago with gradually worsening course since that time. The patient noted no past surgery on the right knee(s).  Patient currently rates pain in the right knee(s) at 10 out of 10 with activity. Patient has night pain, worsening of pain with activity and weight bearing, pain that interferes with activities of daily living, pain with passive range of motion, and crepitus.  Patient has evidence of subchondral cysts, subchondral sclerosis, periarticular osteophytes, and joint space narrowing by imaging studies.  There is no active infection.  Patient Active Problem List   Diagnosis Date Noted   Celiac disease 03/21/2021   Chronic constipation 09/29/2020   Dysphagia 09/29/2020   Primary osteoarthritis of left knee 12/03/2018   Primary osteoarthritis of right knee 07/18/2018   Hypertension 04/28/2015   Past Medical History:  Diagnosis Date   Anxiety    Arthritis    Cataract    Celiac disease    suspected   Colon polyps    Depression    GERD (gastroesophageal reflux disease)    Hypertension    Osteoporosis    Pre-diabetes    Sleep apnea     Past Surgical History:  Procedure Laterality Date   ABDOMINAL HYSTERECTOMY     BACK SURGERY  1990   colonosocpy     ESOPHAGOGASTRODUODENOSCOPY     EYE SURGERY     TOTAL KNEE ARTHROPLASTY Right 07/18/2018   Procedure: TOTAL KNEE ARTHROPLASTY;  Surgeon: Jodi Geralds, MD;  Location: [facility];  Service: Orthopedics;   Laterality: Right;   WRIST FRACTURE SURGERY Left    x 2    No current facility-administered medications for this encounter.   Current Outpatient Medications  Medication Sig Dispense Refill Last Dose   atorvastatin (LIPITOR) 20 MG tablet Take 20 mg by mouth at bedtime.      buPROPion (WELLBUTRIN XL) 150 MG 24 hr tablet Take 150 mg by mouth daily.      ferrous sulfate 325 (65 FE) MG EC tablet Take 1 tablet (325 mg total) by mouth daily with breakfast. 30 tablet 0    losartan (COZAAR) 25 MG tablet Take 25 mg by mouth daily.      meloxicam (MOBIC) 15 MG tablet Take 15 mg by mouth daily.      metoprolol succinate (TOPROL-XL) 25 MG 24 hr tablet Take 25 mg by mouth daily.      montelukast (SINGULAIR) 10 MG tablet Take 10 mg by mouth daily.      Multiple Vitamin (MULTIVITAMIN WITH MINERALS) TABS tablet Take 1 tablet by mouth daily.      omeprazole (PRILOSEC) 20 MG capsule Take 20 mg by mouth daily.      potassium chloride SA (KLOR-CON M) 20 MEQ tablet Take 20 mEq by mouth daily.      traZODone (DESYREL) 50 MG tablet Take 50 mg by mouth at bedtime.      venlafaxine XR (EFFEXOR-XR) 150 MG 24 hr capsule Take 150 mg by mouth daily.      dicyclomine (BENTYL) 10 MG capsule Take 1 capsule (10  mg total) by mouth 3 (three) times daily as needed for spasms (abdominal pain). (Patient not taking: Reported on 06/28/2022) 30 capsule 1 Not Taking   linaclotide (LINZESS) 290 MCG CAPS capsule Take 1 capsule (290 mcg total) by mouth every other day. (Patient not taking: Reported on 01/03/2022) 10 capsule 0    ONETOUCH ULTRA test strip daily.      Peppermint Oil (IBGARD) 90 MG CPCR Take 1 capsule by mouth daily at 2 PM. (Patient not taking: Reported on 06/28/2022) 30 capsule 3 Not Taking   Sodium Sulfate-Mag Sulfate-KCl (SUTAB) [phone] MG TABS Use as directed (Patient not taking: Reported on 06/28/2022) 24 tablet 0 Not Taking   tiZANidine (ZANAFLEX) 2 MG tablet Take 1 tablet (2 mg total) by mouth every 8 (eight) hours  as needed for muscle spasms. (Patient not taking: Reported on 10/17/2021) 40 tablet 0    Allergies  Allergen Reactions   Anise Extract [Flavoring Agent] Hives and Swelling    Social History   Tobacco Use   Smoking status: Never   Smokeless tobacco: Never  Substance Use Topics   Alcohol use: Never    Family History  Problem Relation Age of Onset   Diabetes Mother    Colon cancer Neg Hx    Colon polyps Neg Hx    Rectal cancer Neg Hx    Esophageal cancer Neg Hx    Stomach cancer Neg Hx      Review of Systems  Constitutional: Negative.   HENT: Negative.    Eyes: Negative.   Respiratory: Negative.    Cardiovascular: Negative.   Gastrointestinal: Negative.   Genitourinary: Negative.   Musculoskeletal:  Positive for joint pain and myalgias.  Skin: Negative.   Neurological: Negative.   Psychiatric/Behavioral: Negative.    .Review of Systems  Constitutional: Negative.   HENT: Negative.    Eyes: Negative.   Respiratory: Negative.    Cardiovascular: Negative.   Gastrointestinal: Negative.   Genitourinary: Negative.   Musculoskeletal:  Positive for joint pain and myalgias.  Skin: Negative.   Neurological: Negative.   Endo/Heme/Allergies: Negative.   Psychiatric/Behavioral: Negative.        Objective:  Physical Exam Constitutional:      Appearance: Normal appearance.  HENT:     Head: Normocephalic and atraumatic.     Nose: Nose normal.     Mouth/Throat:     Mouth: Mucous membranes are moist.  Eyes:     Extraocular Movements: Extraocular movements intact.     Pupils: Pupils are equal, round, and reactive to light.  Cardiovascular:     Rate and Rhythm: Normal rate and regular rhythm.     Pulses: Normal pulses.     Heart sounds: Normal heart sounds.  Pulmonary:     Effort: Pulmonary effort is normal.     Breath sounds: Normal breath sounds.  Abdominal:     General: Bowel sounds are normal.     Palpations: Abdomen is soft.  Musculoskeletal:        General:  Swelling and tenderness present.     Cervical back: Normal range of motion and neck supple.  Skin:    General: Skin is warm and dry.  Neurological:     General: No focal deficit present.     Mental Status: She is alert and oriented to person, place, and time.  Psychiatric:        Mood and Affect: Mood normal.        Behavior: Behavior normal.    Left knee  exam: She has a mild effusion.  Range of motion 0 to 105.  No instability to varus or valgus stress. Right knee exam: She has some crepitation through range of motion.  No instability..  Vital signs in last 24 hours:    Labs: Recent Results (from the past 2160 hour(s))  Surgical pcr screen     Status: None   Collection Time: 07/03/22 10:48 AM   Specimen: Nasal Mucosa; Nasal Swab  Result Value Ref Range   MRSA, PCR NEGATIVE NEGATIVE   Staphylococcus aureus NEGATIVE NEGATIVE    Comment: (NOTE) The Xpert SA Assay (FDA approved for NASAL specimens in patients 81 years of age and older), is one component of a comprehensive surveillance program. It is not intended to diagnose infection nor to guide or monitor treatment. Performed at [facility], [address], [place], [place] [address]   Basic metabolic panel per protocol     Status: Abnormal   Collection Time: 07/03/22 11:21 AM  Result Value Ref Range   Sodium 137 135 - 145 mmol/L   Potassium 4.5 3.5 - 5.1 mmol/L   Chloride 100 98 - 111 mmol/L   CO2 27 22 - 32 mmol/L   Glucose, Bld 100 (H) 70 - 99 mg/dL    Comment: Glucose reference range applies only to samples taken after fasting for at least 8 hours.   BUN 21 (H) 6 - 20 mg/dL   Creatinine, Ser 6.04 (H) 0.44 - 1.00 mg/dL   Calcium 9.9 8.9 - 54.0 mg/dL   GFR, Estimated >98 >11 mL/min    Comment: (NOTE) Calculated using the CKD-EPI Creatinine Equation (2021)    Anion gap 10 5 - 15    Comment: Performed at [facility], [address] [place], [place] [address]  CBC per  protocol     Status: None   Collection Time: 07/03/22 11:21 AM  Result Value Ref Range   WBC 8.4 4.0 - 10.5 K/uL   RBC 4.34 3.87 - 5.11 MIL/uL   Hemoglobin 12.7 12.0 - 15.0 g/dL   HCT 29.5 62.1 - 30.8 %   MCV 89.2 80.0 - 100.0 fL   MCH 29.3 26.0 - 34.0 pg   MCHC 32.8 30.0 - 36.0 g/dL   RDW 65.7 84.6 - 96.2 %   Platelets 290 150 - 400 K/uL   nRBC 0.0 0.0 - 0.2 %    Comment: Performed at [facility], [address] [place], [place] [address]      Estimated body mass index is 33.95 kg/m as calculated from the following:   Height as of 07/03/22: 5\' 5"  (1.651 m).   Weight as of 07/03/22: 92.5 kg.   Imaging Review Plain radiographs demonstrate severe degenerative joint disease of the left knee(s). The overall alignment isneutral. The bone quality appears to be good for age and reported activity level.      Assessment/Plan:  End stage arthritis, left knee   The patient history, physical examination, clinical judgment of the provider and imaging studies are consistent with end stage degenerative joint disease of the left knee(s) and total knee arthroplasty is deemed medically necessary. The treatment options including medical management, injection therapy arthroscopy and arthroplasty were discussed at length. The risks and benefits of total knee arthroplasty were presented and reviewed. The risks due to aseptic loosening, infection, stiffness, patella tracking problems, thromboembolic complications and other imponderables were discussed. The patient acknowledged the explanation, agreed to proceed with the plan and consent  was signed. Patient is being admitted for inpatient treatment for surgery, pain control, PT, OT, prophylactic antibiotics, VTE prophylaxis, progressive ambulation and ADL's and discharge planning. The patient is planning to be discharged home with home health services     Patient's anticipated LOS is less than 2 midnights, meeting these  requirements: - Younger than 91 - Lives within 1 hour of care - Has a competent adult at home to recover with post-op recover - NO history of  - Chronic pain requiring opiods  - Diabetes  - Coronary Artery Disease  - Heart failure  - Heart attack  - Stroke  - DVT/VTE  - Cardiac arrhythmia  - Respiratory Failure/COPD  - Renal failure  - Anemia  - Advanced Liver disease

## 2022-07-08 DIAGNOSIS — E785 Hyperlipidemia, unspecified: Secondary | ICD-10-CM | POA: Diagnosis not present

## 2022-07-08 DIAGNOSIS — I129 Hypertensive chronic kidney disease with stage 1 through stage 4 chronic kidney disease, or unspecified chronic kidney disease: Secondary | ICD-10-CM | POA: Diagnosis not present

## 2022-07-08 DIAGNOSIS — M1990 Unspecified osteoarthritis, unspecified site: Secondary | ICD-10-CM | POA: Diagnosis not present

## 2022-07-08 DIAGNOSIS — R32 Unspecified urinary incontinence: Secondary | ICD-10-CM | POA: Diagnosis not present

## 2022-07-08 DIAGNOSIS — Z008 Encounter for other general examination: Secondary | ICD-10-CM | POA: Diagnosis not present

## 2022-07-08 DIAGNOSIS — M810 Age-related osteoporosis without current pathological fracture: Secondary | ICD-10-CM | POA: Diagnosis not present

## 2022-07-08 DIAGNOSIS — K219 Gastro-esophageal reflux disease without esophagitis: Secondary | ICD-10-CM | POA: Diagnosis not present

## 2022-07-08 DIAGNOSIS — J301 Allergic rhinitis due to pollen: Secondary | ICD-10-CM | POA: Diagnosis not present

## 2022-07-08 DIAGNOSIS — F411 Generalized anxiety disorder: Secondary | ICD-10-CM | POA: Diagnosis not present

## 2022-07-08 DIAGNOSIS — E669 Obesity, unspecified: Secondary | ICD-10-CM | POA: Diagnosis not present

## 2022-07-08 DIAGNOSIS — G4700 Insomnia, unspecified: Secondary | ICD-10-CM | POA: Diagnosis not present

## 2022-07-09 ENCOUNTER — Encounter (HOSPITAL_COMMUNITY): Payer: Self-pay | Admitting: Orthopedic Surgery

## 2022-07-09 ENCOUNTER — Other Ambulatory Visit: Payer: Self-pay

## 2022-07-09 ENCOUNTER — Ambulatory Visit (HOSPITAL_COMMUNITY): Payer: Medicare HMO | Admitting: Anesthesiology

## 2022-07-09 ENCOUNTER — Observation Stay (HOSPITAL_COMMUNITY)
Admission: RE | Admit: 2022-07-09 | Discharge: 2022-07-10 | Disposition: A | Discharge status: Home / Self Care | Payer: Medicare HMO | Attending: Orthopedic Surgery | Admitting: Orthopedic Surgery

## 2022-07-09 ENCOUNTER — Encounter (HOSPITAL_COMMUNITY)
Admission: RE | Disposition: A | Discharge status: Home / Self Care | Payer: Self-pay | Source: Home / Self Care | Attending: Orthopedic Surgery

## 2022-07-09 DIAGNOSIS — F418 Other specified anxiety disorders: Secondary | ICD-10-CM | POA: Diagnosis not present

## 2022-07-09 DIAGNOSIS — G4730 Sleep apnea, unspecified: Secondary | ICD-10-CM

## 2022-07-09 DIAGNOSIS — E669 Obesity, unspecified: Secondary | ICD-10-CM

## 2022-07-09 DIAGNOSIS — M1712 Unilateral primary osteoarthritis, left knee: Secondary | ICD-10-CM

## 2022-07-09 DIAGNOSIS — Z96652 Presence of left artificial knee joint: Secondary | ICD-10-CM

## 2022-07-09 DIAGNOSIS — Z96651 Presence of right artificial knee joint: Secondary | ICD-10-CM | POA: Insufficient documentation

## 2022-07-09 DIAGNOSIS — Z6833 Body mass index (BMI) 33.0-33.9, adult: Secondary | ICD-10-CM

## 2022-07-09 DIAGNOSIS — I10 Essential (primary) hypertension: Secondary | ICD-10-CM | POA: Insufficient documentation

## 2022-07-09 DIAGNOSIS — Z79899 Other long term (current) drug therapy: Secondary | ICD-10-CM | POA: Insufficient documentation

## 2022-07-09 DIAGNOSIS — M1711 Unilateral primary osteoarthritis, right knee: Secondary | ICD-10-CM

## 2022-07-09 DIAGNOSIS — G8918 Other acute postprocedural pain: Secondary | ICD-10-CM | POA: Diagnosis not present

## 2022-07-09 HISTORY — PX: TOTAL KNEE ARTHROPLASTY: SHX125

## 2022-07-09 SURGERY — ARTHROPLASTY, KNEE, TOTAL
Anesthesia: Regional | Site: Knee | Laterality: Left

## 2022-07-09 MED ORDER — TRANEXAMIC ACID-NACL 1000-0.7 MG/100ML-% IV SOLN: 1000.0000 mg | Freq: Once | INTRAVENOUS | Status: DC

## 2022-07-09 MED ORDER — OXYCODONE HCL 5 MG PO TABS
ORAL_TABLET | ORAL | Status: AC
  Administered 2022-07-09: 5 mg via ORAL
  Filled 2022-07-09: qty 1

## 2022-07-09 MED ORDER — AMISULPRIDE (ANTIEMETIC) 5 MG/2ML IV SOLN
INTRAVENOUS | Status: AC
  Filled 2022-07-09: qty 4

## 2022-07-09 MED ORDER — OXYCODONE HCL 5 MG/5ML PO SOLN: 5.0000 mg | Freq: Once | ORAL | Status: AC | PRN

## 2022-07-09 MED ORDER — BUPIVACAINE HCL 0.25 % IJ SOLN
INTRAMUSCULAR | Status: AC
  Filled 2022-07-09: qty 1

## 2022-07-09 MED ORDER — BUPIVACAINE LIPOSOME 1.3 % IJ SUSP
INTRAMUSCULAR | Status: DC | PRN
  Administered 2022-07-09: 20 mL

## 2022-07-09 MED ORDER — OXYCODONE-ACETAMINOPHEN 5-325 MG PO TABS: 1.0000 | ORAL_TABLET | Freq: Four times a day (QID) | ORAL | 0 refills | Status: DC | PRN

## 2022-07-09 MED ORDER — PROPOFOL 10 MG/ML IV BOLUS
INTRAVENOUS | Status: DC | PRN
  Administered 2022-07-09: 20 mg via INTRAVENOUS

## 2022-07-09 MED ORDER — OXYCODONE HCL 5 MG PO TABS
5.0000 mg | ORAL_TABLET | ORAL | Status: DC | PRN
  Administered 2022-07-09 – 2022-07-10 (×4): 10 mg via ORAL
  Filled 2022-07-09 (×4): qty 2

## 2022-07-09 MED ORDER — FENTANYL CITRATE PF 50 MCG/ML IJ SOSY
100.0000 ug | PREFILLED_SYRINGE | Freq: Once | INTRAMUSCULAR | Status: DC
  Filled 2022-07-09: qty 2

## 2022-07-09 MED ORDER — CELECOXIB 200 MG PO CAPS
200.0000 mg | ORAL_CAPSULE | Freq: Two times a day (BID) | ORAL | Status: DC
  Administered 2022-07-09 – 2022-07-10 (×2): 200 mg via ORAL
  Filled 2022-07-09 (×2): qty 1

## 2022-07-09 MED ORDER — PHENYLEPHRINE HCL-NACL 20-0.9 MG/250ML-% IV SOLN
INTRAVENOUS | Status: AC
  Filled 2022-07-09: qty 500

## 2022-07-09 MED ORDER — DOCUSATE SODIUM 100 MG PO CAPS: 100.0000 mg | ORAL_CAPSULE | Freq: Two times a day (BID) | ORAL | 0 refills | Status: DC

## 2022-07-09 MED ORDER — HYDROMORPHONE HCL 1 MG/ML IJ SOLN
0.5000 mg | INTRAMUSCULAR | Status: DC | PRN
  Administered 2022-07-09: 1 mg via INTRAVENOUS

## 2022-07-09 MED ORDER — PROPOFOL 500 MG/50ML IV EMUL
INTRAVENOUS | Status: DC | PRN
  Administered 2022-07-09: 50 ug/kg/min via INTRAVENOUS

## 2022-07-09 MED ORDER — ONDANSETRON HCL 4 MG PO TABS: 4.0000 mg | ORAL_TABLET | Freq: Four times a day (QID) | ORAL | Status: DC | PRN

## 2022-07-09 MED ORDER — BUPIVACAINE IN DEXTROSE 0.75-8.25 % IT SOLN
INTRATHECAL | Status: DC | PRN
  Administered 2022-07-09: 1.6 mL via INTRATHECAL

## 2022-07-09 MED ORDER — LACTATED RINGERS IV SOLN: INTRAVENOUS | Status: DC

## 2022-07-09 MED ORDER — LACTATED RINGERS IV BOLUS
250.0000 mL | Freq: Once | INTRAVENOUS | Status: AC
  Administered 2022-07-09: 250 mL via INTRAVENOUS

## 2022-07-09 MED ORDER — DOCUSATE SODIUM 100 MG PO CAPS
100.0000 mg | ORAL_CAPSULE | Freq: Two times a day (BID) | ORAL | Status: DC
  Administered 2022-07-09 – 2022-07-10 (×2): 100 mg via ORAL
  Filled 2022-07-09 (×2): qty 1

## 2022-07-09 MED ORDER — MIDAZOLAM HCL 2 MG/2ML IJ SOLN
2.0000 mg | Freq: Once | INTRAMUSCULAR | Status: AC
  Administered 2022-07-09: 2 mg via INTRAVENOUS
  Filled 2022-07-09: qty 2

## 2022-07-09 MED ORDER — SODIUM CHLORIDE 0.9 % IR SOLN
Status: DC | PRN
  Administered 2022-07-09: 1000 mL

## 2022-07-09 MED ORDER — DEXAMETHASONE SODIUM PHOSPHATE 10 MG/ML IJ SOLN
10.0000 mg | Freq: Once | INTRAMUSCULAR | Status: AC
  Administered 2022-07-09: 10 mg via INTRAVENOUS
  Filled 2022-07-09: qty 1

## 2022-07-09 MED ORDER — SODIUM CHLORIDE (PF) 0.9 % IJ SOLN
INTRAMUSCULAR | Status: AC
  Filled 2022-07-09: qty 50

## 2022-07-09 MED ORDER — CHLORHEXIDINE GLUCONATE 0.12 % MT SOLN
15.0000 mL | Freq: Once | OROMUCOSAL | Status: AC
  Administered 2022-07-09: 15 mL via OROMUCOSAL

## 2022-07-09 MED ORDER — LACTATED RINGERS IV BOLUS
500.0000 mL | Freq: Once | INTRAVENOUS | Status: AC
  Administered 2022-07-09: 500 mL via INTRAVENOUS

## 2022-07-09 MED ORDER — FENTANYL CITRATE PF 50 MCG/ML IJ SOSY
PREFILLED_SYRINGE | INTRAMUSCULAR | Status: AC
  Administered 2022-07-09: 50 ug via INTRAVENOUS
  Filled 2022-07-09: qty 1

## 2022-07-09 MED ORDER — TIZANIDINE HCL 2 MG PO TABS: 2.0000 mg | ORAL_TABLET | Freq: Three times a day (TID) | ORAL | 0 refills | Status: DC | PRN

## 2022-07-09 MED ORDER — ACETAMINOPHEN 325 MG PO TABS: 325.0000 mg | ORAL_TABLET | Freq: Four times a day (QID) | ORAL | Status: DC | PRN

## 2022-07-09 MED ORDER — BUPIVACAINE LIPOSOME 1.3 % IJ SUSP
INTRAMUSCULAR | Status: AC
  Filled 2022-07-09: qty 20

## 2022-07-09 MED ORDER — ROPIVACAINE HCL 5 MG/ML IJ SOLN
INTRAMUSCULAR | Status: DC | PRN
  Administered 2022-07-09: 30 mL via PERINEURAL

## 2022-07-09 MED ORDER — PHENYLEPHRINE HCL-NACL 20-0.9 MG/250ML-% IV SOLN
INTRAVENOUS | Status: DC | PRN
  Administered 2022-07-09: 25 ug/min via INTRAVENOUS

## 2022-07-09 MED ORDER — FENTANYL CITRATE PF 50 MCG/ML IJ SOSY
25.0000 ug | PREFILLED_SYRINGE | INTRAMUSCULAR | Status: DC | PRN
  Administered 2022-07-09: 50 ug via INTRAVENOUS

## 2022-07-09 MED ORDER — 0.9 % SODIUM CHLORIDE (POUR BTL) OPTIME
TOPICAL | Status: DC | PRN
  Administered 2022-07-09: 1000 mL

## 2022-07-09 MED ORDER — ACETAMINOPHEN 500 MG PO TABS
1000.0000 mg | ORAL_TABLET | Freq: Once | ORAL | Status: AC
  Administered 2022-07-09: 1000 mg via ORAL

## 2022-07-09 MED ORDER — ORAL CARE MOUTH RINSE: 15.0000 mL | Freq: Once | OROMUCOSAL | Status: AC

## 2022-07-09 MED ORDER — BUPIVACAINE-EPINEPHRINE (PF) 0.25% -1:200000 IJ SOLN
INTRAMUSCULAR | Status: DC | PRN
  Administered 2022-07-09: 50 mL

## 2022-07-09 MED ORDER — PROPOFOL 1000 MG/100ML IV EMUL
INTRAVENOUS | Status: AC
  Filled 2022-07-09: qty 100

## 2022-07-09 MED ORDER — SODIUM CHLORIDE 0.9% FLUSH
INTRAVENOUS | Status: DC | PRN
  Administered 2022-07-09: 50 mL

## 2022-07-09 MED ORDER — SODIUM CHLORIDE 0.9 % IV SOLN: INTRAVENOUS | Status: DC

## 2022-07-09 MED ORDER — DIPHENHYDRAMINE HCL 12.5 MG/5ML PO ELIX: 12.5000 mg | ORAL_SOLUTION | ORAL | Status: DC | PRN

## 2022-07-09 MED ORDER — CEFAZOLIN SODIUM-DEXTROSE 2-4 GM/100ML-% IV SOLN
2.0000 g | Freq: Once | INTRAVENOUS | Status: AC
  Administered 2022-07-09: 2 g via INTRAVENOUS
  Filled 2022-07-09: qty 100

## 2022-07-09 MED ORDER — FENTANYL CITRATE PF 50 MCG/ML IJ SOSY
PREFILLED_SYRINGE | INTRAMUSCULAR | Status: AC
  Filled 2022-07-09: qty 1

## 2022-07-09 MED ORDER — ASPIRIN 325 MG PO TBEC: 325.0000 mg | DELAYED_RELEASE_TABLET | Freq: Two times a day (BID) | ORAL | 0 refills | Status: DC

## 2022-07-09 MED ORDER — CELECOXIB 200 MG PO CAPS: 200.0000 mg | ORAL_CAPSULE | Freq: Two times a day (BID) | ORAL | 0 refills | Status: DC

## 2022-07-09 MED ORDER — TRANEXAMIC ACID-NACL 1000-0.7 MG/100ML-% IV SOLN
1000.0000 mg | INTRAVENOUS | Status: AC
  Administered 2022-07-09: 1000 mg via INTRAVENOUS
  Filled 2022-07-09: qty 100

## 2022-07-09 MED ORDER — CEFAZOLIN SODIUM-DEXTROSE 2-4 GM/100ML-% IV SOLN
2.0000 g | INTRAVENOUS | Status: AC
  Administered 2022-07-09: 1 g via INTRAVENOUS
  Filled 2022-07-09: qty 100

## 2022-07-09 MED ORDER — BUPIVACAINE LIPOSOME 1.3 % IJ SUSP: 20.0000 mL | Freq: Once | INTRAMUSCULAR | Status: DC

## 2022-07-09 MED ORDER — PROMETHAZINE HCL 25 MG/ML IJ SOLN: 6.2500 mg | INTRAMUSCULAR | Status: DC | PRN

## 2022-07-09 MED ORDER — OXYCODONE HCL 5 MG PO TABS: 5.0000 mg | ORAL_TABLET | Freq: Once | ORAL | Status: AC | PRN

## 2022-07-09 MED ORDER — ONDANSETRON HCL 4 MG/2ML IJ SOLN
4.0000 mg | Freq: Four times a day (QID) | INTRAMUSCULAR | Status: DC | PRN
  Administered 2022-07-09: 4 mg via INTRAVENOUS

## 2022-07-09 MED ORDER — POVIDONE-IODINE 10 % EX SWAB: 2.0000 | Freq: Once | CUTANEOUS | Status: DC

## 2022-07-09 MED ORDER — ASPIRIN 325 MG PO TBEC
325.0000 mg | DELAYED_RELEASE_TABLET | Freq: Two times a day (BID) | ORAL | Status: DC
  Administered 2022-07-10: 325 mg via ORAL
  Filled 2022-07-09: qty 1

## 2022-07-09 MED ORDER — METHOCARBAMOL 500 MG PO TABS
ORAL_TABLET | ORAL | Status: AC
  Administered 2022-07-09: 500 mg via ORAL
  Filled 2022-07-09: qty 1

## 2022-07-09 MED ORDER — BUPIVACAINE-EPINEPHRINE 0.25% -1:200000 IJ SOLN
INTRAMUSCULAR | Status: AC
  Filled 2022-07-09: qty 1

## 2022-07-09 MED ORDER — AMISULPRIDE (ANTIEMETIC) 5 MG/2ML IV SOLN
10.0000 mg | Freq: Once | INTRAVENOUS | Status: AC | PRN
  Administered 2022-07-09: 10 mg via INTRAVENOUS

## 2022-07-09 MED ORDER — METHOCARBAMOL 500 MG IVPB - SIMPLE MED: 500.0000 mg | Freq: Four times a day (QID) | INTRAVENOUS | Status: DC | PRN

## 2022-07-09 MED ORDER — METHOCARBAMOL 500 MG PO TABS
500.0000 mg | ORAL_TABLET | Freq: Four times a day (QID) | ORAL | Status: DC | PRN
  Administered 2022-07-09: 500 mg via ORAL
  Filled 2022-07-09: qty 1

## 2022-07-09 MED ORDER — ONDANSETRON HCL 4 MG/2ML IJ SOLN
INTRAMUSCULAR | Status: AC
  Filled 2022-07-09: qty 2

## 2022-07-09 MED ORDER — HYDROMORPHONE HCL 1 MG/ML IJ SOLN
INTRAMUSCULAR | Status: AC
  Filled 2022-07-09: qty 1

## 2022-07-09 MED ORDER — WATER FOR IRRIGATION, STERILE IR SOLN
Status: DC | PRN
  Administered 2022-07-09: 2000 mL

## 2022-07-09 SURGICAL SUPPLY — 60 items
APL SKNCLS STERI-STRIP NONHPOA (GAUZE/BANDAGES/DRESSINGS) ×1
ATTUNE PSFEM LTSZ4 NARCEM KNEE (Femur) IMPLANT
ATTUNE PSRP INSR SZ4 6 KNEE (Insert) IMPLANT
BAG COUNTER SPONGE SURGICOUNT (BAG) IMPLANT
BAG SPEC THK2 15X12 ZIP CLS (MISCELLANEOUS) ×1
BAG SPNG CNTER NS LX DISP (BAG)
BAG ZIPLOCK 12X15 (MISCELLANEOUS) ×2 IMPLANT
BASEPLATE TIBIAL ROTATING SZ 4 (Knees) IMPLANT
BENZOIN TINCTURE PRP APPL 2/3 (GAUZE/BANDAGES/DRESSINGS) ×2 IMPLANT
BLADE SAGITTAL 25.0X1.19X90 (BLADE) ×2 IMPLANT
BLADE SAW SGTL 13.0X1.19X90.0M (BLADE) ×2 IMPLANT
BLADE SURG SZ10 CARB STEEL (BLADE) ×4 IMPLANT
BNDG CMPR 5X62 HK CLSR LF (GAUZE/BANDAGES/DRESSINGS) ×1
BNDG CMPR 6"X 5 YARDS HK CLSR (GAUZE/BANDAGES/DRESSINGS) ×1
BNDG CMPR MED 15X6 ELC VLCR LF (GAUZE/BANDAGES/DRESSINGS) ×1
BNDG ELASTIC 6INX 5YD STR LF (GAUZE/BANDAGES/DRESSINGS) ×2 IMPLANT
BNDG ELASTIC 6X15 VLCR STRL LF (GAUZE/BANDAGES/DRESSINGS) IMPLANT
BOOTIES KNEE HIGH SLOAN (MISCELLANEOUS) ×2 IMPLANT
BOWL SMART MIX CTS (DISPOSABLE) ×2 IMPLANT
BSPLAT TIB 4 CMNT ROT PLAT STR (Knees) ×1 IMPLANT
CEMENT HV SMART SET (Cement) ×4 IMPLANT
CLSR STERI-STRIP ANTIMIC 1/2X4 (GAUZE/BANDAGES/DRESSINGS) IMPLANT
COVER SURGICAL LIGHT HANDLE (MISCELLANEOUS) ×2 IMPLANT
CUFF TOURN SGL QUICK 34 (TOURNIQUET CUFF) ×1
CUFF TRNQT CYL 34X4.125X (TOURNIQUET CUFF) ×2 IMPLANT
DRAPE INCISE IOBAN 66X45 STRL (DRAPES) ×2 IMPLANT
DRAPE U-SHAPE 47X51 STRL (DRAPES) ×2 IMPLANT
DRSG AQUACEL AG ADV 3.5X10 (GAUZE/BANDAGES/DRESSINGS) ×2 IMPLANT
DURAPREP 26ML APPLICATOR (WOUND CARE) ×2 IMPLANT
ELECT REM PT RETURN 15FT ADLT (MISCELLANEOUS) ×2 IMPLANT
GLOVE BIOGEL PI IND STRL 8 (GLOVE) ×4 IMPLANT
GLOVE ECLIPSE 7.5 STRL STRAW (GLOVE) ×4 IMPLANT
GOWN STRL REUS W/ TWL XL LVL3 (GOWN DISPOSABLE) ×4 IMPLANT
GOWN STRL REUS W/TWL XL LVL3 (GOWN DISPOSABLE) ×2
HANDPIECE INTERPULSE COAX TIP (DISPOSABLE) ×1
HOLDER FOLEY CATH W/STRAP (MISCELLANEOUS) IMPLANT
HOOD PEEL AWAY T7 (MISCELLANEOUS) ×6 IMPLANT
KIT TURNOVER KIT A (KITS) IMPLANT
MANIFOLD NEPTUNE II (INSTRUMENTS) ×2 IMPLANT
NDL HYPO 22X1.5 SAFETY MO (MISCELLANEOUS) ×4 IMPLANT
NEEDLE HYPO 22X1.5 SAFETY MO (MISCELLANEOUS) ×2 IMPLANT
NS IRRIG 1000ML POUR BTL (IV SOLUTION) ×2 IMPLANT
PACK TOTAL KNEE CUSTOM (KITS) ×2 IMPLANT
PADDING CAST COTTON 6X4 STRL (CAST SUPPLIES) ×2 IMPLANT
PATELLA MEDIAL ATTUN 35MM KNEE (Knees) IMPLANT
PIN STEINMAN FIXATION KNEE (PIN) IMPLANT
PROTECTOR NERVE ULNAR (MISCELLANEOUS) ×2 IMPLANT
SET HNDPC FAN SPRY TIP SCT (DISPOSABLE) ×2 IMPLANT
SPIKE FLUID TRANSFER (MISCELLANEOUS) ×4 IMPLANT
STRIP CLOSURE SKIN 1/2X4 (GAUZE/BANDAGES/DRESSINGS) IMPLANT
SUT MNCRL AB 3-0 PS2 18 (SUTURE) ×2 IMPLANT
SUT VIC AB 0 CT1 36 (SUTURE) ×2 IMPLANT
SUT VIC AB 1 CT1 36 (SUTURE) ×4 IMPLANT
SUT VIC AB 2-0 CT1 27 (SUTURE) ×1
SUT VIC AB 2-0 CT1 TAPERPNT 27 (SUTURE) ×2 IMPLANT
SYR CONTROL 10ML LL (SYRINGE) ×4 IMPLANT
TRAY FOLEY MTR SLVR 16FR STAT (SET/KITS/TRAYS/PACK) IMPLANT
TUBE SUCTION HIGH CAP CLEAR NV (SUCTIONS) ×2 IMPLANT
WATER STERILE IRR 1000ML POUR (IV SOLUTION) ×4 IMPLANT
WRAP KNEE MAXI GEL POST OP (GAUZE/BANDAGES/DRESSINGS) ×2 IMPLANT

## 2022-07-09 NOTE — Anesthesia Postprocedure Evaluation (Signed)
Anesthesia Post Note  Patient: Connie Garrett  Procedure(s) Performed: TOTAL KNEE ARTHROPLASTY (Left: Knee)     Patient location during evaluation: PACU Anesthesia Type: Regional and Spinal Level of consciousness: awake Pain management: pain level controlled Vital Signs Assessment: post-procedure vital signs reviewed and stable Respiratory status: spontaneous breathing, nonlabored ventilation and respiratory function stable Cardiovascular status: blood pressure returned to baseline and stable Postop Assessment: no apparent nausea or vomiting Anesthetic complications: no   No notable events documented.  Last Vitals:  Vitals:   07/09/22 1458 07/09/22 1500  BP: 100/89 100/89  Pulse: 75 75  Resp: 12   Temp: 36.7 C   SpO2: 92% 92%    Last Pain:  Vitals:   07/09/22 1439  TempSrc:   PainSc: 4                  Lisel Siegrist P Kasee Hantz

## 2022-07-09 NOTE — Progress Notes (Signed)
   07/09/22 2259  BiPAP/CPAP/SIPAP  $ Non-Invasive Home Ventilator  Initial  $ Face Mask Medium Yes  BiPAP/CPAP/SIPAP Pt Type Adult  BiPAP/CPAP/SIPAP DREAMSTATIOND  Mask Type Full face mask  Mask Size Medium  Respiratory Rate 18 breaths/min  FiO2 (%) 21 %  Patient Home Equipment No  Auto Titrate Yes (min-5 max-20)

## 2022-07-09 NOTE — Op Note (Signed)
PATIENT ID:      Connie Garrett  MRN:     [id number] DOB/AGE:    1960-03-19 / 62 y.o.       OPERATIVE REPORT   DATE OF PROCEDURE:  07/09/2022      PREOPERATIVE DIAGNOSIS:   LEFT KNEE OSTEOARTHRITIS      Estimated body mass index is 33.95 kg/m as calculated from the following:   Height as of this encounter: 5\' 5"  (1.651 m).   Weight as of this encounter: 92.5 kg.                                                       POSTOPERATIVE DIAGNOSIS:   Same                                                                  PROCEDURE:  Procedure(s): TOTAL KNEE ARTHROPLASTY Using DepuyAttune RP implants #4N Femur, #4Tibia, 6 mm Attune RP bearing, 35 Patella    SURGEON: Harvie Junior  ASSISTANT:   Gus Puma PA-C   (Present and scrubbed throughout the case, critical for assistance with exposure, retraction, instrumentation, and closure.)        ANESTHESIA: spinal, 20cc Exparel, 50cc 0.25% Marcaine EBL: min cc FLUID REPLACEMENT: unk cc crystaloid TOURNIQUET: DRAINS: None TRANEXAMIC ACID: 1gm IV, 2gm topical COMPLICATIONS:  None         INDICATIONS FOR PROCEDURE: The patient has  LEFT KNEE OSTEOARTHRITIS, mild valgus deformities, XR shows bone on bone arthritis, lateral subluxation of tibia. Patient has failed all conservative measures including anti-inflammatory medicines, narcotics, attempts at exercise and weight loss, cortisone injections and viscosupplementation.  Risks and benefits of surgery have been discussed, questions answered.   DESCRIPTION OF PROCEDURE: The patient identified by armband, received  IV antibiotics, in the holding area at [facility]. Patient taken to the operating room, appropriate anesthetic monitors were attached, and spinal anesthesia was  induced. IV Tranexamic acid was given. Lateral post and 2 surefoot positioners applied to the table, the lower extremity was then prepped and draped in usual sterile fashion from the toes to the high thigh. Time-out  procedure was performed. Gus Puma PAC, was present and scrubbed throughout the case, critical for assistance with, positioning, exposure, retraction, instrumentation, and closure.The skin and subcutaneous tissue along the incision was injected with 20 cc of a mixture of 20cc Exparel and 30cc Marcaine 50cc saline solution, using a 21-gauge by 1-1/2 inch needle. We began the operation, with the knee flexed 130 degrees, by making the anterior midline incision starting at handbreadth above the patella going over the patella 1 cm medial to and 4 cm distal to the tibial tubercle. Small bleeders in the skin and the subcutaneous tissue identified and cauterized. Transverse retinaculum was incised and reflected medially and a medial parapatellar arthrotomy was accomplished. the patella was everted and theprepatellar fat pad resected. The superficial medial collateral ligament was then elevated from anterior to posterior along the proximal flare of the tibia and anterior half of the menisci resected. The knee was hyperflexed exposing bone on bone arthritis. Peripheral and  notch osteophytes as well as the cruciate ligaments were then resected. We continued to work our way around posteriorly along the proximal tibia, and externally rotated the tibia subluxing it out from underneath the femur. A McHale PCL retractor was placed through the notch, a lateral Hohmann retractor, and anterolateral small homan retractor placed. We then entered the proximal tibia with the Depuy starter drill in line with the axis of the tibia followed by an intramedullary guide rod and 3-degree posterior slope cutting guide. The tibial cutting guide, was pinned into place allowing resection of 2 mm of bone medially and 10 mm of bone laterally. Satisfied with the tibial resection, we then entered the distal femur 2 mm anterior to the PCL origin with the starter drill, followed by the intramedullary guide rod and applied the distal femoral cutting  guide set at 9 mm, with 5 degrees of valgus. This was pinned along the epicondylar axis. At this point, the distal femoral cut was accomplished without difficulty. We then sized for a #4N femoral component and pinned the chamfer guide in 3 degrees of external rotation. The anterior, posterior, and chamfer cuts were accomplished without difficulty followed by the Attune RP box cutting guide and the box cut. We also removed posterior osteophytes from the posterior femoral condyles. The posterior capsule was injected with Exparel solution. The knee was brought into full extension. We checked our extension gap and fit a 6 mm trial lollipop. Distracting in extension with a lamina spreader,  bleeders in the posterior capsule, Posterior medial and posterior lateral gutter were cauterized.  The transexamic acid-soaked sponge was then placed in the gap of the knee in extension. The knee was flexed 30. The posterior patella cut was accomplished with the 9.5 mm Attune cutting guide, sized for a 35 mm dome, and the fixation pegs drilled.The knee was then once again hyperflexed exposing the proximal tibia. We sized for a # 4 tibial base plate, applied the smokestack and the conical reamer followed by the the Delta fin keel punch. We then hammered into place the Attune RP trial femoral component, drilled the lugs, inserted a  6 mm trial bearing, trial patellar button, and took the knee through range of motion from 0-130 degrees. Medial and lateral ligamentous stability was checked. No thumb pressure was required for patellar Tracking.  All trial components were removed, mating surfaces irrigated with pulse lavage, and dried with suction and sponges. 10 cc of the Exparel solution was applied to the cancellus bone of the patella distal femur and proximal tibia.  After waiting 30 seconds, the bony surfaces were again, dried with sponges. A double batch of DePuy HV cement was mixed and applied to all bony metallic mating surfaces  except for the posterior condyles of the femur itself. In order, we hammered into place the tibial tray and removed excess cement, the femoral component and removed excess cement. The final Attune RP bearing was inserted, and the knee brought to full extension with compression. The patellar button was clamped into place, and excess cement removed. The knee was held at 30 flexion with compression using the second surefoot, while the cement cured. The wound was irrigated out with normal saline solution pulse lavage. The rest of the Exparel was injected into the parapatellar arthrotomy, subcutaneous tissues, and periosteal tissues. The parapatellar arthrotomy was closed with running #1 Vicryl suture. The subcutaneous tissue with 3-0 undyed Vicryl suture, and the skin with running 3-0 SQ vicryl. An Aquacil dressing and Ace wrap were  applied. The patient was taken to recovery room without difficulty.   Harvie Junior 07/09/2022, 1:13 PM

## 2022-07-09 NOTE — Transfer of Care (Signed)
Immediate Anesthesia Transfer of Care Note  Patient: Connie Garrett  Procedure(s) Performed: Procedure(s) (LRB): TOTAL KNEE ARTHROPLASTY (Left)  Patient Location: PACU  Anesthesia Type: MAC/Spinal  Level of Consciousness: awake, alert , oriented and patient cooperative  Airway & Oxygen Therapy: Patient Spontanous Breathing and Patient connected to Nasal Cannula Post-op Assessment: Report given to PACU RN and Post -op Vital signs reviewed and stable  Post vital signs: Reviewed and stable  Complications: No apparent anesthesia complications  Last Vitals:  Vitals Value Taken Time  BP 101/58 07/09/22 1338  Temp    Pulse 75 07/09/22 1340  Resp    SpO2 100 % 07/09/22 1340  Vitals shown include unvalidated device data.  Last Pain:  Vitals:   07/09/22 0930  TempSrc: Oral         Complications: No notable events documented.

## 2022-07-09 NOTE — Interval H&P Note (Signed)
History and Physical Interval Note:  07/09/2022 11:00 AM  Connie Garrett  has presented today for surgery, with the diagnosis of LEFT KNEE OSTEOARTHRITIS.  The various methods of treatment have been discussed with the patient and family. After consideration of risks, benefits and other options for treatment, the patient has consented to  Procedure(s): TOTAL KNEE ARTHROPLASTY (Left) as a surgical intervention.  The patient's history has been reviewed, patient examined, no change in status, stable for surgery.  I have reviewed the patient's chart and labs.  Questions were answered to the patient's satisfaction.     Harvie Junior

## 2022-07-09 NOTE — Evaluation (Addendum)
Physical Therapy Evaluation Patient Details Name: Connie Garrett MRN: [id number] DOB: 1960/11/11 Today's Date: 07/09/2022  History of Present Illness  62 yo female presents to therapy s/p L TKA on 07/09/2022 due to failure of conservative measures. Pt PMH includes but is not limited to celiac dz, dysphagia, HTN, GERD, OSA, R TKA (2020) and surgery on back and  L wrist.  Clinical Impression    Connie Garrett is a 62 y.o. female POD 0 s/p L TKA. Patient reports IND with mobility at baseline. Patient is now limited by functional impairments (see PT problem list below) and requires min A  for bed mobility and min A for sit to stand  from EOB and max A to control decent to EOB due to LLE instability attributed to slow regression of anesthesia. Patient was unable to safely attempt to ambulate at time of eval in PACU. PT anticipated same day discharge and is not safe or appropriate at this time.  Patient instructed in exercise to facilitate ROM and circulation to manage edema, supine TE in bed with AA for SLR and SAQ. Patient will benefit from continued skilled PT interventions to address impairments and progress towards PLOF. Acute PT will follow to progress mobility and stair training in preparation for safe discharge home with family and social support and OPPT services. Pt c/o nausea prior to PT eval. Pt actively vomited at end of evaluation. Due to LLE instability PT unable to fully assess for orthostatic hypotension, see Bp below. Bp at rest semi reclined 118/58 (75 PR) Seated EOB 131/80 (95 PR) Immediate standing unable to obtain due to limited standing tolerance of 15s, s/p returning to EOB          Bp 122/79 (92 PR) O2 saturation fluctuated on RA from 88-94%    Assistance Recommended at Discharge Intermittent Supervision/Assistance  If plan is discharge home, recommend the following:  Can travel by private vehicle  A little help with walking and/or transfers;A little help with  bathing/dressing/bathroom;Assistance with cooking/housework;Assist for transportation;Help with stairs or ramp for entrance        Equipment Recommendations Rolling walker (2 wheels) (provided and adjusted at time of eval)  Recommendations for Other Services       Functional Status Assessment Patient has had a recent decline in their functional status and demonstrates the ability to make significant improvements in function in a reasonable and predictable amount of time.     Precautions / Restrictions Precautions Precautions: Knee;Fall Restrictions Weight Bearing Restrictions: Yes Other Position/Activity Restrictions: LLE WBAT      Mobility  Bed Mobility Overal bed mobility: Needs Assistance Bed Mobility: Supine to Sit, Sit to Supine     Supine to sit: Min assist, HOB elevated Sit to supine: HOB elevated, Min assist   General bed mobility comments: cues for manuvring LLE to EOB from supine to sit with A for trunk and sit to supine min A for LLE    Transfers Overall transfer level: Needs assistance Equipment used: Rolling walker (2 wheels) Transfers: Sit to/from Stand Sit to Stand: Min assist           General transfer comment: cues for hand placement, increased time. reports of dizziness with supine to sit and sit to stand. pt able to tolerate ~15s standing EOB and unable to weight shift to LLE with instability requiring therapist to provide max A to control decent to EOB    Ambulation/Gait  General Gait Details: NT LLE instability, reports of nausea and active vomiting  Stairs            Wheelchair Mobility     Tilt Bed    Modified Rankin (Stroke Patients Only)       Balance Overall balance assessment: Needs assistance Sitting-balance support: Bilateral upper extremity supported Sitting balance-Leahy Scale: Fair     Standing balance support: Bilateral upper extremity supported, During functional activity, Reliant on  assistive device for balance Standing balance-Leahy Scale: Poor                               Pertinent Vitals/Pain Pain Assessment Pain Assessment: 0-10 Pain Score: 7  (no pain at rest) Pain Location: L knee Pain Intervention(s): Limited activity within patient's tolerance, Monitored during session, Premedicated before session, Repositioned, Ice applied    Home Living Family/patient expects to be discharged to:: Private residence Living Arrangements: Children Available Help at Discharge: Family;Friend(s) Type of Home: Apartment Home Access: Stairs to enter Entrance Stairs-Rails: Right;Can reach Technical sales engineer of Steps: 4   Home Layout: One level Home Equipment: Cane - single point      Prior Function Prior Level of Function : Independent/Modified Independent;Driving             Mobility Comments: IND with all ADLs, self  care tasks, IADLs driving no AD       Hand Dominance        Extremity/Trunk Assessment        Lower Extremity Assessment Lower Extremity Assessment: LLE deficits/detail LLE Deficits / Details: ankle DF/PF 5/5; SLR > 10 degree lag LLE Sensation: decreased proprioception;decreased light touch    Cervical / Trunk Assessment Cervical / Trunk Assessment: Kyphotic  Communication   Communication: No difficulties  Cognition Arousal/Alertness: Lethargic, Suspect due to medications Behavior During Therapy: Flat affect Overall Cognitive Status: Within Functional Limits for tasks assessed                                          General Comments      Exercises Total Joint Exercises Ankle Circles/Pumps: AROM, Both, 20 reps Quad Sets: AROM, Left, 5 reps (multimodal cues and minimal quad innervation noted) Short Arc Quad: Left, 5 reps, AAROM (minimal quad innervation noted) Heel Slides: AROM, Left, 5 reps Hip ABduction/ADduction: AROM, Left, 5 reps Straight Leg Raises: Left, 5 reps, AAROM (minimal  quad innervation noted)   Assessment/Plan    PT Assessment Patient needs continued PT services  PT Problem List Decreased strength;Decreased range of motion;Decreased activity tolerance;Decreased balance;Decreased mobility;Decreased coordination;Pain       PT Treatment Interventions DME instruction;Gait training;Stair training;Functional mobility training;Therapeutic activities;Therapeutic exercise;Balance training;Neuromuscular re-education;Patient/family education;Modalities    PT Goals (Current goals can be found in the Care Plan section)  Acute Rehab PT Goals Patient Stated Goal: to go home and walk normally and do exercies PT Goal Formulation: With patient Time For Goal Achievement: 07/23/22 Potential to Achieve Goals: Good    Frequency 7X/week     Co-evaluation               AM-PAC PT "6 Clicks" Mobility  Outcome Measure Help needed turning from your back to your side while in a flat bed without using bedrails?: A Little Help needed moving from lying on your back to sitting on the side  of a flat bed without using bedrails?: A Little Help needed moving to and from a bed to a chair (including a wheelchair)?: A Lot Help needed standing up from a chair using your arms (e.g., wheelchair or bedside chair)?: Total Help needed to walk in hospital room?: Total Help needed climbing 3-5 steps with a railing? : Total 6 Click Score: 11    End of Session Equipment Utilized During Treatment: Gait belt Activity Tolerance: No increased pain;Patient limited by fatigue;Treatment limited secondary to medical complications (Comment) (N and V and LLE instabiltiy attributed to slow regression of anesthesia)   Nurse Communication: Mobility status PT Visit Diagnosis: Unsteadiness on feet (R26.81);Other abnormalities of gait and mobility (R26.89);Muscle weakness (generalized) (M62.81);Difficulty in walking, not elsewhere classified (R26.2);Pain Pain - Right/Left: Left Pain - part of body:  Knee;Leg    Time: 1610-9604 PT Time Calculation (min) (ACUTE ONLY): 39 min   Charges:   PT Evaluation $PT Eval Low Complexity: 1 Low PT Treatments $Therapeutic Exercise: 8-22 mins $Therapeutic Activity: 8-22 mins PT General Charges $$ ACUTE PT VISIT: 1 Visit         Johnny Bridge, PT Acute Rehab   Jacqualyn Posey 07/09/2022, 5:47 PM

## 2022-07-09 NOTE — Plan of Care (Signed)
  Problem: Education: Goal: Knowledge of the prescribed therapeutic regimen will improve Outcome: Progressing   Problem: Activity: Goal: Ability to avoid complications of mobility impairment will improve Outcome: Progressing   Problem: Clinical Measurements: Goal: Postoperative complications will be avoided or minimized Outcome: Progressing   Problem: Pain Management: Goal: Pain level will decrease with appropriate interventions Outcome: Progressing   Problem: Activity: Goal: Risk for activity intolerance will decrease Outcome: Progressing   Problem: Nutrition: Goal: Adequate nutrition will be maintained Outcome: Progressing   Problem: Coping: Goal: Level of anxiety will decrease Outcome: Progressing   Problem: Elimination: Goal: Will not experience complications related to bowel motility Outcome: Progressing   Problem: Pain Managment: Goal: General experience of comfort will improve Outcome: Progressing   Problem: Safety: Goal: Ability to remain free from injury will improve Outcome: Progressing

## 2022-07-09 NOTE — Anesthesia Procedure Notes (Signed)
Anesthesia Regional Block: Adductor canal block   Pre-Anesthetic Checklist: , timeout performed,  Correct Patient, Correct Site, Correct Laterality,  Correct Procedure,, site marked,  Risks and benefits discussed,  Surgical consent,  Pre-op evaluation,  At surgeon's request and post-op pain management  Laterality: Left  Prep: chloraprep       Needles:  Injection technique: Single-shot  Needle Type: Echogenic Stimulator Needle     Needle Length: 10cm  Needle Gauge: 20     Additional Needles:   Procedures:,,,, ultrasound used (permanent image in chart),,    Narrative:  Start time: 07/09/2022 10:20 AM End time: 07/09/2022 10:30 AM Injection made incrementally with aspirations every 5 mL.  Performed by: Personally  Anesthesiologist: Leonides Grills, MD  Additional Notes: Functioning IV was confirmed and monitors were applied. A time-out was performed. Hand hygiene and sterile gloves were used. The thigh was placed in a frog-leg position and prepped in a sterile fashion. A 20ga Bbraun echogenic stimulator needle was placed using ultrasound guidance.  Negative aspiration and negative test dose prior to incremental administration of local anesthetic. The patient tolerated the procedure well.

## 2022-07-09 NOTE — Discharge Instructions (Signed)

## 2022-07-09 NOTE — Anesthesia Preprocedure Evaluation (Addendum)
Anesthesia Evaluation  Patient identified by MRN, date of birth, ID band Patient awake    Reviewed: Allergy & Precautions, NPO status , Patient's Chart, lab work & pertinent test results  Airway Mallampati: II  TM Distance: >3 FB Neck ROM: Full    Dental  (+) Edentulous Lower, Upper Dentures   Pulmonary sleep apnea and Continuous Positive Airway Pressure Ventilation    Pulmonary exam normal        Cardiovascular hypertension, Pt. on medications and Pt. on home beta blockers Normal cardiovascular exam     Neuro/Psych  PSYCHIATRIC DISORDERS Anxiety Depression    negative neurological ROS     GI/Hepatic Neg liver ROS,GERD  Medicated and Controlled,,  Endo/Other  negative endocrine ROS    Renal/GU Renal disease     Musculoskeletal  (+) Arthritis ,    Abdominal  (+) + obese  Peds  Hematology negative hematology ROS (+)   Anesthesia Other Findings LEFT KNEE OSTEOARTHRITIS  Reproductive/Obstetrics                             Anesthesia Physical Anesthesia Plan  ASA: 3  Anesthesia Plan: Spinal and Regional   Post-op Pain Management: Regional block*   Induction: Intravenous  PONV Risk Score and Plan: 2 and Ondansetron, Dexamethasone, Midazolam and Treatment may vary due to age or medical condition  Airway Management Planned: Simple Face Mask  Additional Equipment:   Intra-op Plan:   Post-operative Plan:   Informed Consent: I have reviewed the patients History and Physical, chart, labs and discussed the procedure including the risks, benefits and alternatives for the proposed anesthesia with the patient or authorized representative who has indicated his/her understanding and acceptance.     Dental advisory given  Plan Discussed with: CRNA  Anesthesia Plan Comments:        Anesthesia Quick Evaluation

## 2022-07-09 NOTE — Anesthesia Procedure Notes (Signed)
Spinal  Patient location during procedure: OR Start time: 07/09/2022 11:40 AM End time: 07/09/2022 11:45 AM Reason for block: surgical anesthesia Staffing Performed: anesthesiologist  Anesthesiologist: Leonides Grills, MD Performed by: Leonides Grills, MD Authorized by: Leonides Grills, MD   Preanesthetic Checklist Completed: patient identified, IV checked, risks and benefits discussed, surgical consent, monitors and equipment checked, pre-op evaluation and timeout performed Spinal Block Patient position: sitting Prep: DuraPrep Patient monitoring: cardiac monitor, continuous pulse ox and blood pressure Approach: midline Location: L4-5 Injection technique: single-shot Needle Needle type: Pencan  Needle gauge: 24 G Needle length: 9 cm Assessment Sensory level: T10 Events: CSF return Additional Notes Functioning IV was confirmed and monitors were applied. Sterile prep and drape, including hand hygiene and sterile gloves were used. The patient was positioned and the spine was prepped. The skin was anesthetized with lidocaine.  Free flow of clear CSF was obtained prior to injecting local anesthetic into the CSF.  The spinal needle aspirated freely following injection.  The needle was carefully withdrawn.  The patient tolerated the procedure well.

## 2022-07-10 ENCOUNTER — Encounter (HOSPITAL_COMMUNITY): Payer: Self-pay | Admitting: Orthopedic Surgery

## 2022-07-10 DIAGNOSIS — Z96651 Presence of right artificial knee joint: Secondary | ICD-10-CM | POA: Diagnosis not present

## 2022-07-10 DIAGNOSIS — M1712 Unilateral primary osteoarthritis, left knee: Secondary | ICD-10-CM | POA: Diagnosis not present

## 2022-07-10 DIAGNOSIS — I10 Essential (primary) hypertension: Secondary | ICD-10-CM | POA: Diagnosis not present

## 2022-07-10 DIAGNOSIS — Z79899 Other long term (current) drug therapy: Secondary | ICD-10-CM | POA: Diagnosis not present

## 2022-07-10 MED ORDER — ONDANSETRON HCL 4 MG PO TABS: 4.0000 mg | ORAL_TABLET | Freq: Three times a day (TID) | ORAL | 0 refills | Status: DC | PRN

## 2022-07-10 NOTE — Progress Notes (Signed)
Subjective: 1 Day Post-Op Procedure(s) (LRB): TOTAL KNEE ARTHROPLASTY (Left) Patient reports pain as moderate.  Taking fluids by mouth and voiding okay.  Has some mild nausea treated with Zofran.  Objective: Vital signs in last 24 hours: Temp:  [97.6 F (36.4 C)-98.8 F (37.1 C)] 98.3 F (36.8 C) (07/09 0600) Pulse Rate:  [72-81] 79 (07/09 0600) Resp:  [11-20] 16 (07/09 0600) BP: (100-128)/(54-89) 105/54 (07/09 0600) SpO2:  [92 %-100 %] 93 % (07/09 0600) FiO2 (%):  [21 %] 21 % (07/08 2259) Weight:  [92.5 kg] 92.5 kg (07/08 0919)  Intake/Output from previous day: 07/08 0701 - 07/09 0700 In: 2494.8 [P.O.:477; I.V.:1767.8; IV Piggyback:250] Out: 1020 [Urine:1000; Blood:20] Intake/Output this shift: Total I/O In: 240 [P.O.:240] Out: -   No results for input(s): "HGB" in the last 72 hours. No results for input(s): "WBC", "RBC", "HCT", "PLT" in the last 72 hours. No results for input(s): "NA", "K", "CL", "CO2", "BUN", "CREATININE", "GLUCOSE", "CALCIUM" in the last 72 hours. No results for input(s): "LABPT", "INR" in the last 72 hours. Left knee exam: Neurovascular intact Sensation intact distally Intact pulses distally Dorsiflexion/Plantar flexion intact Incision: dressing C/D/I No cellulitis present Compartment soft   Assessment/Plan: 1 Day Post-Op Procedure(s) (LRB): TOTAL KNEE ARTHROPLASTY (Left) Plan: Up with therapy weightbearing as tolerated. Discharge home after physical therapy today. Postop medications have been sent into her pharmacy.  She will take aspirin 325 mg enteric-coated twice daily x 1 month postop for DVT prophylaxis.  I will also send in Zofran 4 mg as needed nausea to her pharmacy. She has been set up for outpatient physical therapy already. Follow-up with Dr. Luiz Blare in 10 to 14 days.    Patient's anticipated LOS is less than 2 midnights, meeting these requirements: - Younger than 23 - Lives within 1 hour of care - Has a competent adult at home  to recover with post-op recover - NO history of  - Chronic pain requiring opiods  - Diabetes  - Coronary Artery Disease  - Heart failure  - Heart attack  - Stroke  - DVT/VTE  - Cardiac arrhythmia  - Respiratory Failure/COPD  - Renal failure  - Anemia  - Advanced Liver disease     Matthew Folks 07/10/2022, 8:08 AM

## 2022-07-10 NOTE — Discharge Summary (Signed)
Patient ID: Connie Garrett MRN: [id number] DOB/AGE: 60-14-1962 60 y.o.  Admit date: 07/09/2022 Discharge date: 07/10/2022  Admission Diagnoses:  Principal Problem:   Total knee replacement status, left Active Problems:   Primary osteoarthritis of left knee   Discharge Diagnoses:  Same  Past Medical History:  Diagnosis Date   Anxiety    Arthritis    Cataract    Celiac disease    suspected   Colon polyps    Depression    GERD (gastroesophageal reflux disease)    Hypertension    Osteoporosis    Pre-diabetes    Sleep apnea     Surgeries: Procedure(s): Left TOTAL KNEE ARTHROPLASTY on 07/09/2022   Consultants:   Discharged Condition: Improved  Hospital Course: TRISTIAN DUST is an 62 y.o. female who was admitted 07/09/2022 for operative treatment ofTotal knee replacement status, left. Patient has severe unremitting pain that affects sleep, daily activities, and work/hobbies. After pre-op clearance the patient was taken to the operating room on 07/09/2022 and underwent  Procedure(s): Left TOTAL KNEE ARTHROPLASTY.    Patient was given perioperative antibiotics:  Anti-infectives (From admission, onward)    Start     Dose/Rate Route Frequency Ordered Stop   07/09/22 1800  ceFAZolin (ANCEF) IVPB 2g/100 mL premix        2 g 200 mL/hr over 30 Minutes Intravenous  Once 07/09/22 1632 07/09/22 2023   07/09/22 0930  ceFAZolin (ANCEF) IVPB 2g/100 mL premix        2 g 200 mL/hr over 30 Minutes Intravenous On call to O.R. 07/09/22 4098 07/09/22 1205        Patient was given sequential compression devices, early ambulation, and chemoprophylaxis to prevent DVT.  Patient benefited maximally from hospital stay and there were no complications.    Recent vital signs: Patient Vitals for the past 24 hrs:  BP Temp Temp src Pulse Resp SpO2 Height Weight  07/10/22 0600 (!) 105/54 98.3 F (36.8 C) -- 79 16 93 % -- --  07/09/22 2058 103/63 98.1 F (36.7 C) -- 74 16 94 % -- --  07/09/22  1852 (!) 121/55 98.2 F (36.8 C) Oral 78 17 98 % -- --  07/09/22 1700 (!) 118/58 -- -- 79 14 93 % -- --  07/09/22 1600 (!) 117/57 -- -- 78 14 97 % -- --  07/09/22 1536 -- -- -- 73 -- 96 % -- --  07/09/22 1500 100/89 -- -- 75 14 92 % -- --  07/09/22 1458 100/89 98 F (36.7 C) -- 75 12 92 % -- --  07/09/22 1445 125/66 -- -- 72 14 97 % -- --  07/09/22 1439 -- -- -- 72 11 94 % -- --  07/09/22 1430 118/69 97.6 F (36.4 C) -- 81 20 93 % -- --  07/09/22 1415 115/61 -- -- 72 19 96 % -- --  07/09/22 1400 (!) 112/57 -- -- 73 13 97 % -- --  07/09/22 1345 112/60 -- -- 72 17 99 % -- --  07/09/22 1338 -- 97.7 F (36.5 C) -- -- 12 98 % -- --  07/09/22 1035 (!) 108/55 -- -- 74 12 99 % -- --  07/09/22 1030 120/60 -- -- 80 18 99 % -- --  07/09/22 1025 (!) 128/57 -- -- 77 15 100 % -- --  07/09/22 0930 116/63 98.8 F (37.1 C) Oral 75 16 94 % -- --  07/09/22 0919 -- -- -- -- -- -- 5\' 5"  (1.651 m) 92.5 kg  Recent laboratory studies: No results for input(s): "WBC", "HGB", "HCT", "PLT", "NA", "K", "CL", "CO2", "BUN", "CREATININE", "GLUCOSE", "INR", "CALCIUM" in the last 72 hours.  Invalid input(s): "PT", "2"   Discharge Medications:   Allergies as of 07/10/2022       Reactions   Anise Extract [flavoring Agent] Hives, Swelling        Medication List     STOP taking these medications    dicyclomine 10 MG capsule Commonly known as: BENTYL   IBgard 90 MG Cpcr Generic drug: Peppermint Oil   linaclotide 290 MCG Caps capsule Commonly known as: Linzess   meloxicam 15 MG tablet Commonly known as: MOBIC   Sutab [phone] MG Tabs Generic drug: Sodium Sulfate-Mag Sulfate-KCl       TAKE these medications    aspirin EC 325 MG tablet Take 1 tablet (325 mg total) by mouth 2 (two) times daily after a meal. Take x 1 month post op to decrease risk of blood clots.   atorvastatin 20 MG tablet Commonly known as: LIPITOR Take 20 mg by mouth at bedtime.   buPROPion 150 MG 24 hr  tablet Commonly known as: WELLBUTRIN XL Take 150 mg by mouth daily.   celecoxib 200 MG capsule Commonly known as: CeleBREX Take 1 capsule (200 mg total) by mouth 2 (two) times daily.   docusate sodium 100 MG capsule Commonly known as: Colace Take 1 capsule (100 mg total) by mouth 2 (two) times daily.   ferrous sulfate 325 (65 FE) MG EC tablet Take 1 tablet (325 mg total) by mouth daily with breakfast.   losartan 25 MG tablet Commonly known as: COZAAR Take 25 mg by mouth daily.   metoprolol succinate 25 MG 24 hr tablet Commonly known as: TOPROL-XL Take 25 mg by mouth daily.   montelukast 10 MG tablet Commonly known as: SINGULAIR Take 10 mg by mouth daily.   multivitamin with minerals Tabs tablet Take 1 tablet by mouth daily.   omeprazole 20 MG capsule Commonly known as: PRILOSEC Take 20 mg by mouth daily.   ondansetron 4 MG tablet Commonly known as: Zofran Take 1 tablet (4 mg total) by mouth every 8 (eight) hours as needed for nausea.   OneTouch Ultra test strip Generic drug: glucose blood daily.   oxyCODONE-acetaminophen 5-325 MG tablet Commonly known as: PERCOCET/ROXICET Take 1-2 tablets by mouth every 6 (six) hours as needed for severe pain.   potassium chloride SA 20 MEQ tablet Commonly known as: KLOR-CON M Take 20 mEq by mouth daily.   tiZANidine 2 MG tablet Commonly known as: ZANAFLEX Take 1 tablet (2 mg total) by mouth every 8 (eight) hours as needed for muscle spasms.   traZODone 50 MG tablet Commonly known as: DESYREL Take 50 mg by mouth at bedtime.   venlafaxine XR 150 MG 24 hr capsule Commonly known as: EFFEXOR-XR Take 150 mg by mouth daily.               Durable Medical Equipment  (From admission, onward)           Start     Ordered   07/09/22 1856  DME Walker rolling  Once       Question:  Patient needs a walker to treat with the following condition  Answer:  Primary osteoarthritis of left knee   07/09/22 1855   07/09/22  1856  DME 3 n 1  Once        07/09/22 1855  Discharge Care Instructions  (From admission, onward)           Start     Ordered   07/10/22 0000  Weight bearing as tolerated       Question Answer Comment  Laterality left   Extremity Lower      07/10/22 0812            Diagnostic Studies: DG Chest 2 View  Result Date: 07/06/2022 CLINICAL DATA:  Preoperative exam. EXAM: CHEST - 2 VIEW COMPARISON:  Chest radiograph 05/01/2021 FINDINGS: The heart size and mediastinal contours are within normal limits. Both lungs are clear. The visualized skeletal structures are unremarkable. IMPRESSION: No active cardiopulmonary disease. Electronically Signed   By: Annia Belt M.D.   On: 07/06/2022 20:54    Disposition: Discharge disposition: 01-Home or Self Care       Discharge Instructions     Call MD / Call 911   Complete by: As directed    If you experience chest pain or shortness of breath, CALL 911 and be transported to the hospital emergency room.  If you develope a fever above 101 F, pus (white drainage) or increased drainage or redness at the wound, or calf pain, call your surgeon's office.   Diet general   Complete by: As directed    Do not put a pillow under the knee. Place it under the heel.   Complete by: As directed    Increase activity slowly as tolerated   Complete by: As directed    Post-operative opioid taper instructions:   Complete by: As directed    POST-OPERATIVE OPIOID TAPER INSTRUCTIONS: It is important to wean off of your opioid medication as soon as possible. If you do not need pain medication after your surgery it is ok to stop day one. Opioids include: Codeine, Hydrocodone(Norco, Vicodin), Oxycodone(Percocet, oxycontin) and hydromorphone amongst others.  Long term and even short term use of opiods can cause: Increased pain response Dependence Constipation Depression Respiratory depression And more.  Withdrawal symptoms can include Flu  like symptoms Nausea, vomiting And more Techniques to manage these symptoms Hydrate well Eat regular healthy meals Stay active Use relaxation techniques(deep breathing, meditating, yoga) Do Not substitute Alcohol to help with tapering If you have been on opioids for less than two weeks and do not have pain than it is ok to stop all together.  Plan to wean off of opioids This plan should start within one week post op of your joint replacement. Maintain the same interval or time between taking each dose and first decrease the dose.  Cut the total daily intake of opioids by one tablet each day Next start to increase the time between doses. The last dose that should be eliminated is the evening dose.      Weight bearing as tolerated   Complete by: As directed    Laterality: left   Extremity: Lower        Follow-up Information     Jodi Geralds, MD. Go on 07/24/2022.   Specialty: Orthopedic Surgery Why: Your appointment is scheduled for 9:45 Contact information: [address] [place] [address] [phone]         Therapy, [facility]. Go on 07/12/2022.   Specialty: Physical Therapy Why: Your appointment is scheduled for 11:00 Contact information: [address] [place] [address] [phone]                  Signed: Matthew Folks 07/10/2022, 8:12  AM

## 2022-07-10 NOTE — Progress Notes (Signed)
Physical Therapy Treatment Patient Details Name: Connie Garrett MRN: [id number] DOB: 11/16/1960 Today's Date: 07/10/2022   History of Present Illness 62 yo female presents to therapy s/p L TKA on 07/09/2022 due to failure of conservative measures. Pt PMH includes but is not limited to celiac dz, dysphagia, HTN, GERD, OSA, R TKA (2020) and surgery on back and  L wrist.    PT Comments  POD # 1 pm session Friend present during session for [employer].  Assisted Pt OOB to amb to bathroom then in hallway.  Then returned to room to perform some TE's following HEP handout.  Instructed on proper tech, freq as well as use of ICE.   Addressed all mobility questions, discussed appropriate activity, educated on use of ICE.  Pt ready for D/C to home.     Assistance Recommended at Discharge Intermittent Supervision/Assistance  If plan is discharge home, recommend the following:  Can travel by private vehicle    A little help with walking and/or transfers;A little help with bathing/dressing/bathroom;Assistance with cooking/housework;Assist for transportation;Help with stairs or ramp for entrance      Equipment Recommendations  Rolling walker (2 wheels)    Recommendations for Other Services       Precautions / Restrictions Precautions Precautions: Knee;Fall Precaution Comments: no pillow under knee Restrictions Weight Bearing Restrictions: No Other Position/Activity Restrictions: LLE WBAT     Mobility  Bed Mobility Overal bed mobility: Needs Assistance Bed Mobility: Supine to Sit     Supine to sit: Supervision, Min guard     General bed mobility comments: increased time using belt to guide LE    Transfers Overall transfer level: Needs assistance Equipment used: Rolling walker (2 wheels) Transfers: Sit to/from Stand Sit to Stand: Supervision, Min guard           General transfer comment: VC's on proprt hand placement and safety with turns.  Also assisted with a toilet  transfer.    Ambulation/Gait Ambulation/Gait assistance: Supervision, Min guard Gait Distance (Feet): 34 Feet Assistive device: Rolling walker (2 wheels) Gait Pattern/deviations: Step-to pattern, Decreased stance time - left Gait velocity: decreased     General Gait Details: tolerated an increased distance amb to bathroom then in hallway.  Had Friend "hands on" assist using safety belt.   Stairs Stairs:  (no stairs to enter her apartment)           Wheelchair Mobility     Tilt Bed    Modified Rankin (Stroke Patients Only)       Balance                                            Cognition Arousal/Alertness: Awake/alert Behavior During Therapy: WFL for tasks assessed/performed Overall Cognitive Status: Within Functional Limits for tasks assessed                                 General Comments: AxO x 3 pleasant Lady who lives alone in an apartment but plans to have a "friend" help her as needed        Exercises  05 reps all seated TE's following HEP handout    General Comments        Pertinent Vitals/Pain Pain Assessment Pain Assessment: 0-10 Pain Score: 6  Pain Location: L knee Pain Descriptors / Indicators: Aching,  Grimacing, Operative site guarding Pain Intervention(s): Monitored during session, Premedicated before session, Repositioned, Ice applied    Home Living                          Prior Function            PT Goals (current goals can now be found in the care plan section) Progress towards PT goals: Progressing toward goals    Frequency    7X/week      PT Plan Current plan remains appropriate    Co-evaluation              AM-PAC PT "6 Clicks" Mobility   Outcome Measure  Help needed turning from your back to your side while in a flat bed without using bedrails?: A Little Help needed moving from lying on your back to sitting on the side of a flat bed without using bedrails?: A  Little Help needed moving to and from a bed to a chair (including a wheelchair)?: A Little Help needed standing up from a chair using your arms (e.g., wheelchair or bedside chair)?: A Little Help needed to walk in hospital room?: A Little Help needed climbing 3-5 steps with a railing? : A Lot 6 Click Score: 17    End of Session Equipment Utilized During Treatment: Gait belt Activity Tolerance: No increased pain;Patient limited by fatigue;Treatment limited secondary to medical complications (Comment) Patient left: in chair;with call bell/phone within reach Nurse Communication: Mobility status PT Visit Diagnosis: Unsteadiness on feet (R26.81);Other abnormalities of gait and mobility (R26.89);Muscle weakness (generalized) (M62.81);Difficulty in walking, not elsewhere classified (R26.2);Pain Pain - Right/Left: Left Pain - part of body: Knee;Leg     Time: 1352-1420 PT Time Calculation (min) (ACUTE ONLY): 28 min  Charges:    $Gait Training: 8-22 mins $Therapeutic Exercise: 8-22 mins PT General Charges $$ ACUTE PT VISIT: 1 Visit                     Felecia Shelling  PTA Acute  Rehabilitation Services Office M-F          [phone]

## 2022-07-10 NOTE — TOC Transition Note (Signed)
Transition of Care [facility]) - CM/SW Discharge Note  Patient Details  Name: Connie Garrett MRN: [id number] Date of Birth: 12/07/1960  Transition of Care [facility]) CM/SW Contact:  Ewing Schlein, LCSW Phone Number: 07/10/2022, 11:09 AM  Clinical Narrative: Patient is expected to discharge home after working with PT. CSW met with patient to review discharge plan and needs. Patient will go home with OPPT at [facility]. Patient will need a rolling walker and 3N1, which [employer] provided. TOC signing off.  Final next level of care: OP Rehab Barriers to Discharge: No Barriers Identified  Patient Goals and CMS Choice CMS Medicare.gov Compare Post Acute Care list provided to:: Patient Choice offered to / list presented to : Patient  Discharge Plan and Services Additional resources added to the After Visit Summary for          DME Arranged: Walker rolling, 3-N-1 DME Agency: [employer] Representative spoke with at DME Agency: Prearranged in orthopedist's office  Social Determinants of Health (SDOH) Interventions SDOH Screenings   Food Insecurity: No Food Insecurity (07/09/2022)  Housing: Low Risk  (07/09/2022)  Transportation Needs: No Transportation Needs (07/09/2022)  Utilities: Not At Risk (07/09/2022)  Depression (PHQ2-9): Medium Risk (07/15/2020)  Tobacco Use: Low Risk  (07/09/2022)   Readmission Risk Interventions     No data to display

## 2022-07-10 NOTE — Care Management Obs Status (Signed)
MEDICARE OBSERVATION STATUS NOTIFICATION   Patient Details  Name: KYRIAKI MODER MRN: [id number] Date of Birth: 11-21-1960   Medicare Observation Status Notification Given:  Yes    Ewing Schlein, LCSW 07/10/2022, 12:11 PM

## 2022-07-10 NOTE — Progress Notes (Signed)
Physical Therapy Treatment Patient Details Name: Connie Garrett MRN: [id number] DOB: [birth year] Today's Date: 07/10/2022   History of Present Illness 62 yo female presents to therapy s/p L TKA on 07/09/2022 due to failure of conservative measures. Pt PMH includes but is not limited to celiac dz, dysphagia, HTN, GERD, OSA, R TKA (2020) and surgery on back and  L wrist.    PT Comments  POD # 1 am session General Comments: AxO x 3 pleasant Lady who lives alone in an apartment but plans to have a "friend" help her as needed.  Assisted OOB to amb a limited distance of 12 feet due to increased pain and effort.  Then returned to room to perform some TE's following HEP handout.  Instructed on proper tech, freq as well as use of ICE.   Will see pt again this afternoon to increase her amb distance as well as complete HEP Education.     Assistance Recommended at Discharge Intermittent Supervision/Assistance  If plan is discharge home, recommend the following:  Can travel by private vehicle    A little help with walking and/or transfers;A little help with bathing/dressing/bathroom;Assistance with cooking/housework;Assist for transportation;Help with stairs or ramp for entrance      Equipment Recommendations  Rolling walker (2 wheels)    Recommendations for Other Services       Precautions / Restrictions Precautions Precautions: Knee;Fall Precaution Comments: no pillow under knee Restrictions Weight Bearing Restrictions: No Other Position/Activity Restrictions: LLE WBAT     Mobility  Bed Mobility Overal bed mobility: Needs Assistance Bed Mobility: Supine to Sit     Supine to sit: Supervision, Min guard     General bed mobility comments: increased time using belt to guide LE    Transfers Overall transfer level: Needs assistance Equipment used: Rolling walker (2 wheels) Transfers: Sit to/from Stand Sit to Stand: Supervision, Min guard           General transfer comment:  VC's on proprt hand placement and safety with turns    Ambulation/Gait Ambulation/Gait assistance: Supervision, Min guard Gait Distance (Feet): 12 Feet Assistive device: Rolling walker (2 wheels) Gait Pattern/deviations: Step-to pattern, Decreased stance time - left Gait velocity: decreased     General Gait Details: limited amb distance 12 feet due to increased pain and effort.   Stairs             Wheelchair Mobility     Tilt Bed    Modified Rankin (Stroke Patients Only)       Balance                                            Cognition Arousal/Alertness: Awake/alert Behavior During Therapy: WFL for tasks assessed/performed Overall Cognitive Status: Within Functional Limits for tasks assessed                                 General Comments: AxO x 3 pleasant Lady who lives alone in an apartment but plans to have a "friend" help her as needed        Exercises   Total Knee Replacement TE's following HEP handout 10 reps B LE ankle pumps 05 reps towel squeezes 05 reps knee presses 05 reps heel slides  05 reps SAQ's 05 reps SLR's 05 reps ABD Educated on use of gait  belt to assist with TE's Followed by ICE    General Comments        Pertinent Vitals/Pain Pain Assessment Pain Assessment: 0-10 Pain Score: 6  Pain Location: L knee Pain Descriptors / Indicators: Aching, Grimacing, Operative site guarding Pain Intervention(s): Monitored during session, Premedicated before session, Repositioned, Ice applied    Home Living                          Prior Function            PT Goals (current goals can now be found in the care plan section) Progress towards PT goals: Progressing toward goals    Frequency    7X/week      PT Plan Current plan remains appropriate    Co-evaluation              AM-PAC PT "6 Clicks" Mobility   Outcome Measure  Help needed turning from your back to your side  while in a flat bed without using bedrails?: A Little Help needed moving from lying on your back to sitting on the side of a flat bed without using bedrails?: A Little Help needed moving to and from a bed to a chair (including a wheelchair)?: A Little Help needed standing up from a chair using your arms (e.g., wheelchair or bedside chair)?: A Little Help needed to walk in hospital room?: A Little Help needed climbing 3-5 steps with a railing? : A Lot 6 Click Score: 17    End of Session Equipment Utilized During Treatment: Gait belt Activity Tolerance: No increased pain;Patient limited by fatigue;Treatment limited secondary to medical complications (Comment) Patient left: in chair;with call bell/phone within reach Nurse Communication: Mobility status PT Visit Diagnosis: Unsteadiness on feet (R26.81);Other abnormalities of gait and mobility (R26.89);Muscle weakness (generalized) (M62.81);Difficulty in walking, not elsewhere classified (R26.2);Pain Pain - Right/Left: Left Pain - part of body: Knee;Leg     Time: 8295-6213 PT Time Calculation (min) (ACUTE ONLY): 33 min  Charges:    $Gait Training: 8-22 mins $Therapeutic Exercise: 8-22 mins PT General Charges $$ ACUTE PT VISIT: 1 Visit                    Felecia Shelling  PTA Acute  Rehabilitation Services Office M-F          [phone]

## 2022-07-12 DIAGNOSIS — J301 Allergic rhinitis due to pollen: Secondary | ICD-10-CM | POA: Diagnosis not present

## 2022-07-14 DIAGNOSIS — G4733 Obstructive sleep apnea (adult) (pediatric): Secondary | ICD-10-CM | POA: Diagnosis not present

## 2022-07-16 DIAGNOSIS — M1712 Unilateral primary osteoarthritis, left knee: Secondary | ICD-10-CM | POA: Diagnosis not present

## 2022-07-16 DIAGNOSIS — M25562 Pain in left knee: Secondary | ICD-10-CM | POA: Diagnosis not present

## 2022-07-16 DIAGNOSIS — Z96652 Presence of left artificial knee joint: Secondary | ICD-10-CM | POA: Diagnosis not present

## 2022-07-17 DIAGNOSIS — M1712 Unilateral primary osteoarthritis, left knee: Secondary | ICD-10-CM | POA: Diagnosis not present

## 2022-07-20 DIAGNOSIS — M25562 Pain in left knee: Secondary | ICD-10-CM | POA: Diagnosis not present

## 2022-07-20 DIAGNOSIS — Z96652 Presence of left artificial knee joint: Secondary | ICD-10-CM | POA: Diagnosis not present

## 2022-07-20 DIAGNOSIS — M1712 Unilateral primary osteoarthritis, left knee: Secondary | ICD-10-CM | POA: Diagnosis not present

## 2022-07-24 DIAGNOSIS — M25562 Pain in left knee: Secondary | ICD-10-CM | POA: Diagnosis not present

## 2022-07-24 DIAGNOSIS — Z96652 Presence of left artificial knee joint: Secondary | ICD-10-CM | POA: Diagnosis not present

## 2022-07-24 DIAGNOSIS — M1712 Unilateral primary osteoarthritis, left knee: Secondary | ICD-10-CM | POA: Diagnosis not present

## 2022-07-27 DIAGNOSIS — Z96652 Presence of left artificial knee joint: Secondary | ICD-10-CM | POA: Diagnosis not present

## 2022-07-27 DIAGNOSIS — M1712 Unilateral primary osteoarthritis, left knee: Secondary | ICD-10-CM | POA: Diagnosis not present

## 2022-07-27 DIAGNOSIS — M25562 Pain in left knee: Secondary | ICD-10-CM | POA: Diagnosis not present

## 2022-08-01 DIAGNOSIS — Z6833 Body mass index (BMI) 33.0-33.9, adult: Secondary | ICD-10-CM | POA: Diagnosis not present

## 2022-08-01 DIAGNOSIS — R5383 Other fatigue: Secondary | ICD-10-CM | POA: Diagnosis not present

## 2022-08-01 DIAGNOSIS — J301 Allergic rhinitis due to pollen: Secondary | ICD-10-CM | POA: Diagnosis not present

## 2022-08-01 DIAGNOSIS — R918 Other nonspecific abnormal finding of lung field: Secondary | ICD-10-CM | POA: Diagnosis not present

## 2022-08-01 DIAGNOSIS — G4733 Obstructive sleep apnea (adult) (pediatric): Secondary | ICD-10-CM | POA: Diagnosis not present

## 2022-08-01 DIAGNOSIS — R0600 Dyspnea, unspecified: Secondary | ICD-10-CM | POA: Diagnosis not present

## 2022-08-01 DIAGNOSIS — J4520 Mild intermittent asthma, uncomplicated: Secondary | ICD-10-CM | POA: Diagnosis not present

## 2022-08-01 DIAGNOSIS — R400 Somnolence: Secondary | ICD-10-CM | POA: Diagnosis not present

## 2022-08-01 DIAGNOSIS — F331 Major depressive disorder, recurrent, moderate: Secondary | ICD-10-CM | POA: Diagnosis not present

## 2022-08-06 DIAGNOSIS — M1712 Unilateral primary osteoarthritis, left knee: Secondary | ICD-10-CM | POA: Diagnosis not present

## 2022-08-06 DIAGNOSIS — Z96652 Presence of left artificial knee joint: Secondary | ICD-10-CM | POA: Diagnosis not present

## 2022-08-06 DIAGNOSIS — M25562 Pain in left knee: Secondary | ICD-10-CM | POA: Diagnosis not present

## 2022-08-08 DIAGNOSIS — M1712 Unilateral primary osteoarthritis, left knee: Secondary | ICD-10-CM | POA: Diagnosis not present

## 2022-08-08 DIAGNOSIS — J301 Allergic rhinitis due to pollen: Secondary | ICD-10-CM | POA: Diagnosis not present

## 2022-08-08 DIAGNOSIS — Z96652 Presence of left artificial knee joint: Secondary | ICD-10-CM | POA: Diagnosis not present

## 2022-08-08 DIAGNOSIS — M25562 Pain in left knee: Secondary | ICD-10-CM | POA: Diagnosis not present

## 2022-08-14 DIAGNOSIS — M1712 Unilateral primary osteoarthritis, left knee: Secondary | ICD-10-CM | POA: Diagnosis not present

## 2022-08-14 DIAGNOSIS — M25562 Pain in left knee: Secondary | ICD-10-CM | POA: Diagnosis not present

## 2022-08-14 DIAGNOSIS — Z96652 Presence of left artificial knee joint: Secondary | ICD-10-CM | POA: Diagnosis not present

## 2022-08-14 DIAGNOSIS — G4733 Obstructive sleep apnea (adult) (pediatric): Secondary | ICD-10-CM | POA: Diagnosis not present

## 2022-08-17 DIAGNOSIS — Z96652 Presence of left artificial knee joint: Secondary | ICD-10-CM | POA: Diagnosis not present

## 2022-08-17 DIAGNOSIS — M1712 Unilateral primary osteoarthritis, left knee: Secondary | ICD-10-CM | POA: Diagnosis not present

## 2022-08-17 DIAGNOSIS — M25562 Pain in left knee: Secondary | ICD-10-CM | POA: Diagnosis not present

## 2022-08-21 DIAGNOSIS — Z96652 Presence of left artificial knee joint: Secondary | ICD-10-CM | POA: Diagnosis not present

## 2022-08-21 DIAGNOSIS — M25562 Pain in left knee: Secondary | ICD-10-CM | POA: Diagnosis not present

## 2022-08-21 DIAGNOSIS — M1712 Unilateral primary osteoarthritis, left knee: Secondary | ICD-10-CM | POA: Diagnosis not present

## 2022-08-23 DIAGNOSIS — M1712 Unilateral primary osteoarthritis, left knee: Secondary | ICD-10-CM | POA: Diagnosis not present

## 2022-08-23 DIAGNOSIS — M25562 Pain in left knee: Secondary | ICD-10-CM | POA: Diagnosis not present

## 2022-08-23 DIAGNOSIS — Z96652 Presence of left artificial knee joint: Secondary | ICD-10-CM | POA: Diagnosis not present

## 2022-08-27 DIAGNOSIS — Z96652 Presence of left artificial knee joint: Secondary | ICD-10-CM | POA: Diagnosis not present

## 2022-08-27 DIAGNOSIS — M25562 Pain in left knee: Secondary | ICD-10-CM | POA: Diagnosis not present

## 2022-08-27 DIAGNOSIS — M1712 Unilateral primary osteoarthritis, left knee: Secondary | ICD-10-CM | POA: Diagnosis not present

## 2022-08-29 DIAGNOSIS — R5383 Other fatigue: Secondary | ICD-10-CM | POA: Diagnosis not present

## 2022-08-29 DIAGNOSIS — R400 Somnolence: Secondary | ICD-10-CM | POA: Diagnosis not present

## 2022-08-29 DIAGNOSIS — J301 Allergic rhinitis due to pollen: Secondary | ICD-10-CM | POA: Diagnosis not present

## 2022-08-29 DIAGNOSIS — R918 Other nonspecific abnormal finding of lung field: Secondary | ICD-10-CM | POA: Diagnosis not present

## 2022-08-29 DIAGNOSIS — G4733 Obstructive sleep apnea (adult) (pediatric): Secondary | ICD-10-CM | POA: Diagnosis not present

## 2022-08-29 DIAGNOSIS — J4520 Mild intermittent asthma, uncomplicated: Secondary | ICD-10-CM | POA: Diagnosis not present

## 2022-08-30 DIAGNOSIS — M1712 Unilateral primary osteoarthritis, left knee: Secondary | ICD-10-CM | POA: Diagnosis not present

## 2022-08-30 DIAGNOSIS — Z96652 Presence of left artificial knee joint: Secondary | ICD-10-CM | POA: Diagnosis not present

## 2022-08-30 DIAGNOSIS — M25562 Pain in left knee: Secondary | ICD-10-CM | POA: Diagnosis not present

## 2022-08-31 DIAGNOSIS — E1169 Type 2 diabetes mellitus with other specified complication: Secondary | ICD-10-CM | POA: Diagnosis not present

## 2022-08-31 DIAGNOSIS — Z1331 Encounter for screening for depression: Secondary | ICD-10-CM | POA: Diagnosis not present

## 2022-08-31 DIAGNOSIS — F331 Major depressive disorder, recurrent, moderate: Secondary | ICD-10-CM | POA: Diagnosis not present

## 2022-08-31 DIAGNOSIS — E785 Hyperlipidemia, unspecified: Secondary | ICD-10-CM | POA: Diagnosis not present

## 2022-08-31 DIAGNOSIS — Z6834 Body mass index (BMI) 34.0-34.9, adult: Secondary | ICD-10-CM | POA: Diagnosis not present

## 2022-08-31 DIAGNOSIS — I10 Essential (primary) hypertension: Secondary | ICD-10-CM | POA: Diagnosis not present

## 2022-09-11 DIAGNOSIS — E1169 Type 2 diabetes mellitus with other specified complication: Secondary | ICD-10-CM | POA: Diagnosis not present

## 2022-09-11 DIAGNOSIS — I10 Essential (primary) hypertension: Secondary | ICD-10-CM | POA: Diagnosis not present

## 2022-09-12 DIAGNOSIS — M25562 Pain in left knee: Secondary | ICD-10-CM | POA: Diagnosis not present

## 2022-09-12 DIAGNOSIS — Z96652 Presence of left artificial knee joint: Secondary | ICD-10-CM | POA: Diagnosis not present

## 2022-09-12 DIAGNOSIS — M1712 Unilateral primary osteoarthritis, left knee: Secondary | ICD-10-CM | POA: Diagnosis not present

## 2022-09-18 DIAGNOSIS — Z6834 Body mass index (BMI) 34.0-34.9, adult: Secondary | ICD-10-CM | POA: Diagnosis not present

## 2022-09-18 DIAGNOSIS — E1169 Type 2 diabetes mellitus with other specified complication: Secondary | ICD-10-CM | POA: Diagnosis not present

## 2022-09-18 DIAGNOSIS — E785 Hyperlipidemia, unspecified: Secondary | ICD-10-CM | POA: Diagnosis not present

## 2022-09-18 DIAGNOSIS — E669 Obesity, unspecified: Secondary | ICD-10-CM | POA: Diagnosis not present

## 2022-09-18 DIAGNOSIS — I10 Essential (primary) hypertension: Secondary | ICD-10-CM | POA: Diagnosis not present

## 2022-10-02 DIAGNOSIS — E66811 Obesity, class 1: Secondary | ICD-10-CM | POA: Diagnosis not present

## 2022-10-02 DIAGNOSIS — F331 Major depressive disorder, recurrent, moderate: Secondary | ICD-10-CM | POA: Diagnosis not present

## 2022-10-02 DIAGNOSIS — Z6834 Body mass index (BMI) 34.0-34.9, adult: Secondary | ICD-10-CM | POA: Diagnosis not present

## 2022-11-07 DIAGNOSIS — Z0100 Encounter for examination of eyes and vision without abnormal findings: Secondary | ICD-10-CM | POA: Diagnosis not present

## 2022-11-07 DIAGNOSIS — H524 Presbyopia: Secondary | ICD-10-CM | POA: Diagnosis not present

## 2022-12-11 DIAGNOSIS — B002 Herpesviral gingivostomatitis and pharyngotonsillitis: Secondary | ICD-10-CM | POA: Diagnosis not present

## 2022-12-11 DIAGNOSIS — E1169 Type 2 diabetes mellitus with other specified complication: Secondary | ICD-10-CM | POA: Diagnosis not present

## 2022-12-11 DIAGNOSIS — I10 Essential (primary) hypertension: Secondary | ICD-10-CM | POA: Diagnosis not present

## 2022-12-11 DIAGNOSIS — Z6834 Body mass index (BMI) 34.0-34.9, adult: Secondary | ICD-10-CM | POA: Diagnosis not present

## 2022-12-12 DIAGNOSIS — R400 Somnolence: Secondary | ICD-10-CM | POA: Diagnosis not present

## 2022-12-12 DIAGNOSIS — G4733 Obstructive sleep apnea (adult) (pediatric): Secondary | ICD-10-CM | POA: Diagnosis not present

## 2022-12-12 DIAGNOSIS — J301 Allergic rhinitis due to pollen: Secondary | ICD-10-CM | POA: Diagnosis not present

## 2022-12-12 DIAGNOSIS — J4520 Mild intermittent asthma, uncomplicated: Secondary | ICD-10-CM | POA: Diagnosis not present

## 2022-12-14 DIAGNOSIS — E1169 Type 2 diabetes mellitus with other specified complication: Secondary | ICD-10-CM | POA: Diagnosis not present

## 2022-12-14 DIAGNOSIS — I10 Essential (primary) hypertension: Secondary | ICD-10-CM | POA: Diagnosis not present

## 2022-12-14 DIAGNOSIS — Z6833 Body mass index (BMI) 33.0-33.9, adult: Secondary | ICD-10-CM | POA: Diagnosis not present

## 2022-12-14 DIAGNOSIS — E785 Hyperlipidemia, unspecified: Secondary | ICD-10-CM | POA: Diagnosis not present

## 2022-12-14 DIAGNOSIS — F331 Major depressive disorder, recurrent, moderate: Secondary | ICD-10-CM | POA: Diagnosis not present

## 2022-12-18 ENCOUNTER — Ambulatory Visit (INDEPENDENT_AMBULATORY_CARE_PROVIDER_SITE_OTHER): Payer: Medicare HMO

## 2022-12-18 VITALS — BP 102/68 | HR 75 | Temp 97.6°F | Resp 16 | Ht 65.0 in | Wt 203.4 lb

## 2022-12-18 DIAGNOSIS — E785 Hyperlipidemia, unspecified: Secondary | ICD-10-CM | POA: Insufficient documentation

## 2022-12-18 DIAGNOSIS — K900 Celiac disease: Secondary | ICD-10-CM | POA: Diagnosis not present

## 2022-12-18 DIAGNOSIS — I10 Essential (primary) hypertension: Secondary | ICD-10-CM | POA: Diagnosis not present

## 2022-12-18 DIAGNOSIS — F331 Major depressive disorder, recurrent, moderate: Secondary | ICD-10-CM | POA: Diagnosis not present

## 2022-12-18 DIAGNOSIS — R7303 Prediabetes: Secondary | ICD-10-CM | POA: Diagnosis not present

## 2022-12-18 NOTE — Patient Instructions (Signed)
VISIT SUMMARY:  Today, we discussed several of your health concerns, including your blood sugar levels, depression, celiac disease, hypertension, cholesterol, and dental issues. We reviewed your current medications and made a plan to address each of these issues. We also talked about the importance of lifestyle changes and regular health screenings.  YOUR PLAN:  -PREDIABETES: Prediabetes means your blood sugar levels are higher than normal but not high enough to be classified as diabetes. Your recent HbA1c was 6.4%, which is borderline for diabetes. We discussed the importance of lifestyle changes, such as improving your diet and increasing physical activity, to help manage your blood sugar levels. We will repeat your HbA1c in 3 months to monitor your progress.  -DEPRESSION: Depression is a mood disorder that causes persistent feelings of sadness and loss of interest. You are currently taking Wellbutrin and Effexor for depression but have reported mood swings and increased depression. We discussed the benefits of exercise and diet on mood and decided not to increase your medication doses to avoid side effects. We will reassess your mood and medication efficacy in 3 months.  -CELIAC DISEASE: Celiac disease is an autoimmune disorder where ingestion of gluten leads to damage in the small intestine. You reported not following a gluten-free diet and experiencing constipation. We discussed the importance of strictly adhering to a gluten-free diet for your overall health and weight management.  -HYPERTENSION: Hypertension, or high blood pressure, is a condition where the force of the blood against your artery walls is too high. Your blood pressure is well-controlled with your current medications, losartan and toprol XL. We will continue with your current medications.  -HYPERLIPIDEMIA: Hyperlipidemia is a condition where there are high levels of fats (lipids) in your blood. You are currently taking  atorvastatin to manage your cholesterol levels. We will order a metabolic profile, including a lipid panel, in 3 months to monitor your cholesterol levels.  -DENTAL ISSUES: You reported that your dentures are ill-fitting and causing sores in your mouth. We recommend seeing a dentist to have your dentures properly fitted to prevent further discomfort.  -GENERAL HEALTH MAINTENANCE: We discussed the importance of regular health screenings and vaccinations. You declined the flu vaccine today. Please ensure your mammograms are up to date and continue with regular health screenings and vaccinations.  INSTRUCTIONS:  Please schedule a follow-up appointment in 3 months. Before your visit, we will need to order blood work, including an HbA1c and a metabolic profile. Additionally, please see a dentist for an evaluation and fitting of your dentures.

## 2022-12-18 NOTE — Progress Notes (Signed)
New Patient Office Visit  Subjective    Patient ID: Connie Garrett, female    DOB: Jul 24, 1960  Age: 62 y.o. MRN: [id number]  CC:  Chief Complaint  Patient presents with   Establish Care   Cough    HPI Connie Garrett presents to establish care Connie Garrett, previously under the care of Dr. Abner Greenspan, presents with concerns about her blood sugar levels. She reports a recent HbA1c of 6.4, which is the highest she has seen. She acknowledges that her diet could be improved and has been more active in recent weeks due to helping her friend with housework post-back surgery. She was prescribed metformin a week ago but is hesitant to take it due to concerns about potential side effects and has not started it yet.  Connie Garrett also reports issues with her dentures, which are causing blisters in her mouth. She has had these dentures for several years and feels they are not fitting well, causing constant rubbing against her palate.Only has them on the upper jaw.  She also mentions having celiac disease but admits to not maintaining a gluten-free diet. She experiences constipation rather than the typical diarrhea associated with gluten consumption in celiac disease.  In addition to these concerns, Connie Garrett is on atorvastatin 20mg  for cholesterol management and losartan 25mg  and toprol XL 25mg  daily for blood pressure control. She has never had a heart attack. She uses an albuterol inhaler for bronchitis, despite never having smoked. For her mental health, she takes Wellbutrin for depression, trazodone for sleep, and Effexor. She reports mood swings and feels more frequently depressed.  Connie Garrett has a history of celiac disease, hysterectomy in 1992, back surgery, and bilateral knee replacements in 2020. She is currently not in need of medication refills.  Outpatient Encounter Medications as of 12/18/2022  Medication Sig   albuterol (VENTOLIN HFA) 108 (90 Base) MCG/ACT inhaler Inhale 2 puffs into the  lungs every 4 (four) hours as needed.   atorvastatin (LIPITOR) 20 MG tablet Take 20 mg by mouth at bedtime.   buPROPion (WELLBUTRIN XL) 150 MG 24 hr tablet Take 150 mg by mouth daily.   ferrous sulfate 325 (65 FE) MG EC tablet Take 1 tablet (325 mg total) by mouth daily with breakfast.   losartan (COZAAR) 25 MG tablet Take 25 mg by mouth daily.   meloxicam (MOBIC) 15 MG tablet Take 15 mg by mouth daily.   metoprolol succinate (TOPROL-XL) 25 MG 24 hr tablet Take 25 mg by mouth daily.   montelukast (SINGULAIR) 10 MG tablet Take 10 mg by mouth daily.   Multiple Vitamin (MULTIVITAMIN WITH MINERALS) TABS tablet Take 1 tablet by mouth daily.   omeprazole (PRILOSEC) 20 MG capsule Take 20 mg by mouth daily.   ondansetron (ZOFRAN) 4 MG tablet Take 1 tablet (4 mg total) by mouth every 8 (eight) hours as needed for nausea.   ONETOUCH ULTRA test strip daily.   potassium chloride SA (KLOR-CON M) 20 MEQ tablet Take 20 mEq by mouth daily.   traZODone (DESYREL) 50 MG tablet Take 50 mg by mouth at bedtime.   venlafaxine (EFFEXOR) 75 MG tablet Take 75 mg by mouth daily.   venlafaxine XR (EFFEXOR-XR) 150 MG 24 hr capsule Take 150 mg by mouth daily.   [DISCONTINUED] aspirin EC 325 MG tablet Take 1 tablet (325 mg total) by mouth 2 (two) times daily after a meal. Take x 1 month post op to decrease risk of blood clots.   [DISCONTINUED] buPROPion [facility] SR)  150 MG 12 hr tablet Take 150 mg by mouth daily.   [DISCONTINUED] celecoxib (CELEBREX) 200 MG capsule Take 1 capsule (200 mg total) by mouth 2 (two) times daily.   [DISCONTINUED] docusate sodium (COLACE) 100 MG capsule Take 1 capsule (100 mg total) by mouth 2 (two) times daily.   [DISCONTINUED] oxyCODONE-acetaminophen (PERCOCET/ROXICET) 5-325 MG tablet Take 1-2 tablets by mouth every 6 (six) hours as needed for severe pain.   [DISCONTINUED] tiZANidine (ZANAFLEX) 2 MG tablet Take 1 tablet (2 mg total) by mouth every 8 (eight) hours as needed for muscle spasms.    No facility-administered encounter medications on file as of 12/18/2022.    Past Medical History:  Diagnosis Date   Anxiety    Arthritis    Cataract    Celiac disease    suspected   Colon polyps    Depression    GERD (gastroesophageal reflux disease)    Hypertension    Osteoporosis    Pre-diabetes    Sleep apnea     Past Surgical History:  Procedure Laterality Date   ABDOMINAL HYSTERECTOMY  1992   BACK SURGERY  1990   colonosocpy     ESOPHAGOGASTRODUODENOSCOPY     EYE SURGERY     TOTAL KNEE ARTHROPLASTY Right 07/18/2018   Procedure: TOTAL KNEE ARTHROPLASTY;  Surgeon: Jodi Geralds, MD;  Location: [facility];  Service: Orthopedics;  Laterality: Right;   TOTAL KNEE ARTHROPLASTY Left 07/09/2022   Procedure: TOTAL KNEE ARTHROPLASTY;  Surgeon: Jodi Geralds, MD;  Location: [facility];  Service: Orthopedics;  Laterality: Left;   WRIST FRACTURE SURGERY Left    x 2    Family History  Problem Relation Age of Onset   Diabetes Mother    Colon cancer Neg Hx    Colon polyps Neg Hx    Rectal cancer Neg Hx    Esophageal cancer Neg Hx    Stomach cancer Neg Hx     Social History   Socioeconomic History   Marital status: Single    Spouse name: Not on file   Number of children: 2   Years of education: Not on file   Highest education level: Not on file  Occupational History   Occupation: Homemaker  Tobacco Use   Smoking status: Never   Smokeless tobacco: Never  Vaping Use   Vaping status: Never Used  Substance and Sexual Activity   Alcohol use: Never   Drug use: Never   Sexual activity: Not Currently  Other Topics Concern   Not on file  Social History Narrative   Not on file   Social Drivers of Health   Financial Resource Strain: Not on file  Food Insecurity: No Food Insecurity (07/09/2022)   Hunger Vital Sign    Worried About Running Out of Food in the Last Year: Never true    Ran Out of Food in the Last Year: Never true  Transportation Needs: No Transportation Needs  (07/09/2022)   PRAPARE - Administrator, Civil Service (Medical): No    Lack of Transportation (Non-Medical): No  Physical Activity: Not on file  Stress: Not on file  Social Connections: Not on file  Intimate Partner Violence: Not At Risk (07/09/2022)   Humiliation, Afraid, Rape, and Kick questionnaire    Fear of Current or Ex-Partner: No    Emotionally Abused: No    Physically Abused: No    Sexually Abused: No    Review of Systems  Constitutional: Negative.   HENT:  Sores in the mouth   Eyes: Negative.   Respiratory:  Positive for cough.   Cardiovascular: Negative.   Gastrointestinal: Negative.   Genitourinary: Negative.   Musculoskeletal: Negative.   Skin: Negative.   Neurological: Negative.   Psychiatric/Behavioral:  Positive for depression.         Objective    BP 102/68 (BP Location: Right Arm, Patient Position: Sitting, Cuff Size: Large)   Pulse 75   Temp 97.6 F (36.4 C) (Temporal)   Resp 16   Ht 5\' 5"  (1.651 m)   Wt 203 lb 6.4 oz (92.3 kg)   SpO2 98%   BMI 33.85 kg/m   Physical Exam Vitals and nursing note reviewed.  Constitutional:      Appearance: She is obese.  HENT:     Head: Normocephalic.     Mouth/Throat:     Comments: Dentures noted on upper jaw An area of redness noted on the hard palate possibly from friction with denture Eyes:     Pupils: Pupils are equal, round, and reactive to light.  Cardiovascular:     Rate and Rhythm: Normal rate and regular rhythm.  Pulmonary:     Effort: Pulmonary effort is normal.     Breath sounds: Normal breath sounds.  Musculoskeletal:        General: Normal range of motion.     Cervical back: Normal range of motion.  Skin:    General: Skin is warm.  Neurological:     General: No focal deficit present.     Mental Status: She is alert.  Psychiatric:        Mood and Affect: Mood normal.         Assessment & Plan:   Problem List Items Addressed This Visit     Celiac disease    Not following a gluten-free diet, reports constipation. Discussed importance of a gluten-free diet for health and weight management. - Encourage strict adherence to a gluten-free diet      Borderline diabetes - Primary   A1c of 6.4% one week ago, borderline diabetes.  Prefers not to start metformin due to concerns.  Discussed lifestyle changes for blood sugar management.  Emphasized metformin's safety, efficacy, and cardiovascular benefits but agreed to defer medication if lifestyle changes are implemented. Clarified metformin does not cause kidney failure but is contraindicated in patients with kidney failure. - Repeat A1c in 3 months - Encourage lifestyle changes including diet modification and increased physical activity       Relevant Orders   Hemoglobin A1c   Benign essential HTN   Managed with losartan 25 mg daily and toprol XL 25 mg daily. Blood pressure today is 102/68 mmHg, well-controlled. - Continue current antihypertensive medications       Relevant Orders   Comprehensive metabolic panel   Hyperlipidemia LDL goal <130   Managed with atorvastatin 20 mg daily. No recent cholesterol levels discussed. - Order metabolic profile including lipid panel in 3 months      Relevant Orders   Lipid Panel   Major depressive disorder, recurrent, moderate (HCC)   Managed with Wellbutrin XL 150 mg daily and Effexor. Reports mood swings and increased depression. Discussed benefits of exercise and diet on mood. Advised against increasing medication doses to avoid side effects. - Encourage lifestyle changes including diet modification and increased physical activity - Reassess mood and medication efficacy in 3 months     Dental Issues Ill-fitting dentures causing sores. Advised to see a dentist for proper fitting. -  Refer to dentist for evaluation and fitting of dentures  General Health Maintenance 62 years old. Discussed importance of regular screenings and vaccinations. Declined  flu vaccine today. - Ensure mammograms are up to date - Encourage regular health screenings and vaccinations  Follow-up - Schedule follow-up appointment in 3 months - Order blood work including A1c and metabolic profile a few days before the follow-up visit.      Relevant Medications   venlafaxine (EFFEXOR) 75 MG tablet    Return in about 3 months (around 03/18/2023).  Total time spent on today's visit was 45 minutes, including both face-to-face time and nonface-to-face time personally spent on review of chart (labs and imaging), discussing labs and goals, discussing further work-up, treatment options, referrals to specialist if needed, reviewing outside records of pertinent, answering patient's questions, and coordinating care.   Windell Moment, MD

## 2022-12-18 NOTE — Assessment & Plan Note (Signed)
Managed with losartan 25 mg daily and toprol XL 25 mg daily. Blood pressure today is 102/68 mmHg, well-controlled. - Continue current antihypertensive medications

## 2022-12-18 NOTE — Assessment & Plan Note (Signed)
Managed with atorvastatin 20 mg daily. No recent cholesterol levels discussed. - Order metabolic profile including lipid panel in 3 months

## 2022-12-18 NOTE — Assessment & Plan Note (Addendum)
Managed with Wellbutrin XL 150 mg daily and Effexor. Reports mood swings and increased depression. Discussed benefits of exercise and diet on mood. Advised against increasing medication doses to avoid side effects. - Encourage lifestyle changes including diet modification and increased physical activity - Reassess mood and medication efficacy in 3 months     Dental Issues Ill-fitting dentures causing sores. Advised to see a dentist for proper fitting. - Refer to dentist for evaluation and fitting of dentures  General Health Maintenance 62 years old. Discussed importance of regular screenings and vaccinations. Declined flu vaccine today. - Ensure mammograms are up to date - Encourage regular health screenings and vaccinations  Follow-up - Schedule follow-up appointment in 3 months - Order blood work including A1c and metabolic profile a few days before the follow-up visit.

## 2022-12-18 NOTE — Assessment & Plan Note (Signed)
A1c of 6.4% one week ago, borderline diabetes.  Prefers not to start metformin due to concerns.  Discussed lifestyle changes for blood sugar management.  Emphasized metformin's safety, efficacy, and cardiovascular benefits but agreed to defer medication if lifestyle changes are implemented. Clarified metformin does not cause kidney failure but is contraindicated in patients with kidney failure. - Repeat A1c in 3 months - Encourage lifestyle changes including diet modification and increased physical activity

## 2022-12-18 NOTE — Assessment & Plan Note (Signed)
Not following a gluten-free diet, reports constipation. Discussed importance of a gluten-free diet for health and weight management. - Encourage strict adherence to a gluten-free diet

## 2022-12-28 ENCOUNTER — Other Ambulatory Visit: Payer: Self-pay

## 2022-12-28 DIAGNOSIS — F331 Major depressive disorder, recurrent, moderate: Secondary | ICD-10-CM

## 2022-12-28 DIAGNOSIS — I10 Essential (primary) hypertension: Secondary | ICD-10-CM

## 2023-01-01 ENCOUNTER — Other Ambulatory Visit: Payer: Self-pay

## 2023-01-01 MED ORDER — MONTELUKAST SODIUM 10 MG PO TABS: 10.0000 mg | ORAL_TABLET | Freq: Every day | ORAL | 2 refills | Status: DC

## 2023-01-08 ENCOUNTER — Ambulatory Visit: Payer: Medicare HMO

## 2023-01-08 ENCOUNTER — Other Ambulatory Visit: Payer: Self-pay

## 2023-01-08 ENCOUNTER — Telehealth: Payer: Self-pay

## 2023-01-08 VITALS — BP 114/72 | HR 76 | Temp 97.8°F | Ht 65.0 in | Wt 203.4 lb

## 2023-01-08 DIAGNOSIS — F331 Major depressive disorder, recurrent, moderate: Secondary | ICD-10-CM

## 2023-01-08 DIAGNOSIS — K130 Diseases of lips: Secondary | ICD-10-CM

## 2023-01-08 MED ORDER — KETOCONAZOLE 2 % EX CREA: 1.0000 | TOPICAL_CREAM | Freq: Every day | CUTANEOUS | 0 refills | Status: DC

## 2023-01-08 MED ORDER — VENLAFAXINE HCL ER 75 MG PO CP24: 75.0000 mg | ORAL_CAPSULE | Freq: Every day | ORAL | 0 refills | Status: DC

## 2023-01-08 MED ORDER — VENLAFAXINE HCL ER 150 MG PO CP24
150.0000 mg | ORAL_CAPSULE | Freq: Every day | ORAL | 1 refills | Status: DC
Start: 2023-01-08 — End: 2023-03-21

## 2023-01-08 MED ORDER — TRIAMCINOLONE ACETONIDE 0.1 % EX CREA: TOPICAL_CREAM | Freq: Two times a day (BID) | CUTANEOUS | 0 refills | Status: DC

## 2023-01-08 MED ORDER — BUPROPION HCL ER (XL) 150 MG PO TB24: 150.0000 mg | ORAL_TABLET | Freq: Every day | ORAL | 1 refills | Status: DC

## 2023-01-08 NOTE — Assessment & Plan Note (Signed)
 Reports feeling depressed, particularly due to issues with her daughters. Scored high on the depression scale. On Wellbutrin  150 mg and Effexor  (venlafaxine ) 225 mg daily, but has run out of Effexor  for the past three days, likely contributing to current mood. Discussed importance of medication adherence for effective depression management. Benefits of continuing medication include mood stabilization and improved daily functioning. Risks of non-adherence include worsening depression and potential relapse.   - Refill Effexor  XR 150 mg and 75 mg for a total of 225 mg daily, 90 tablets with a refill   - Refill Wellbutrin  150 mg, 90 tablets with a refill   - Advise to call ahead of time for refills to avoid running out of medication   - Follow-up appointment scheduled for March 21, 2023.

## 2023-01-08 NOTE — Progress Notes (Signed)
 Acute Office Visit  Subjective:    Patient ID: Connie Garrett, female    DOB: 04/25/1960, 63 y.o.   MRN: [id number]  Chief Complaint  Patient presents with   fever blister    Discussed the use of AI scribe software for clinical note transcription with the patient, who gave verbal consent to proceed.      HPI: Patient is in today for blister on the top lip. She states a couple days ago, patient has not change anything. Patienit is also feeling depressed.  The patient presents with a chief complaint of chapped and dry lips, which she describes as having blisters on the top lip. The condition has been present for a couple of days and is associated with pain, soreness, and a burning sensation. The patient has been applying lip balm and alcohol  to the affected area, but this has not provided relief. She denies any sores inside the mouth and has not started any new medications recently.  In addition to the lip condition, the patient also reports issues with her dentures. However, after a recent visit to the dentist, the fit has improved significantly.  The patient also reports a high score on a recent depression assessment. She attributes this to ongoing issues with her daughters, which have been causing significant emotional distress. The patient has been on Wellbutrin  and Effexor  for her depression, but she ran out of Effexor  three days prior to the consultation.  The patient denies any smoking history. She has been advised to increase her water  intake to address dehydration, which could be contributing to her lip condition.    Past Medical History:  Diagnosis Date   Anxiety    Arthritis    Cataract    Celiac disease    suspected   Colon polyps    Depression    GERD (gastroesophageal reflux disease)    Hypertension    Osteoporosis    Pre-diabetes    Sleep apnea     Past Surgical History:  Procedure Laterality Date   ABDOMINAL HYSTERECTOMY  1992   BACK SURGERY  1990    colonosocpy     ESOPHAGOGASTRODUODENOSCOPY     EYE SURGERY     TOTAL KNEE ARTHROPLASTY Right 07/18/2018   Procedure: TOTAL KNEE ARTHROPLASTY;  Surgeon: Yvone Rush, MD;  Location: [facility];  Service: Orthopedics;  Laterality: Right;   TOTAL KNEE ARTHROPLASTY Left 07/09/2022   Procedure: TOTAL KNEE ARTHROPLASTY;  Surgeon: Yvone Rush, MD;  Location: [facility];  Service: Orthopedics;  Laterality: Left;   WRIST FRACTURE SURGERY Left    x 2    Family History  Problem Relation Age of Onset   Diabetes Mother    Colon cancer Neg Hx    Colon polyps Neg Hx    Rectal cancer Neg Hx    Esophageal cancer Neg Hx    Stomach cancer Neg Hx     Social History   Socioeconomic History   Marital status: Single    Spouse name: Not on file   Number of children: 2   Years of education: Not on file   Highest education level: Not on file  Occupational History   Occupation: Homemaker  Tobacco Use   Smoking status: Never   Smokeless tobacco: Never  Vaping Use   Vaping status: Never Used  Substance and Sexual Activity   Alcohol  use: Never   Drug use: Never   Sexual activity: Not Currently  Other Topics Concern   Not on file  Social  History Narrative   Not on file   Social Drivers of Health   Financial Resource Strain: Low Risk  (01/08/2023)   Overall Financial Resource Strain (CARDIA)    Difficulty of Paying Living Expenses: Not hard at all  Food Insecurity: No Food Insecurity (07/09/2022)   Hunger Vital Sign    Worried About Running Out of Food in the Last Year: Never true    Ran Out of Food in the Last Year: Never true  Transportation Needs: No Transportation Needs (07/09/2022)   PRAPARE - Administrator, Civil Service (Medical): No    Lack of Transportation (Non-Medical): No  Physical Activity: Inactive (01/08/2023)   Exercise Vital Sign    Days of Exercise per Week: 0 days    Minutes of Exercise per Session: 0 min  Stress: No Stress Concern Present (01/08/2023)   [employer]  of Occupational Health - Occupational Stress Questionnaire    Feeling of Stress : Not at all  Social Connections: Not on file  Intimate Partner Violence: Not At Risk (07/09/2022)   Humiliation, Afraid, Rape, and Kick questionnaire    Fear of Current or Ex-Partner: No    Emotionally Abused: No    Physically Abused: No    Sexually Abused: No    Outpatient Medications Prior to Visit  Medication Sig Dispense Refill   albuterol  (VENTOLIN  HFA) 108 (90 Base) MCG/ACT inhaler Inhale 2 puffs into the lungs every 4 (four) hours as needed.     atorvastatin  (LIPITOR) 20 MG tablet Take 20 mg by mouth at bedtime.     ferrous sulfate  325 (65 FE) MG EC tablet Take 1 tablet (325 mg total) by mouth daily with breakfast. 30 tablet 0   losartan  (COZAAR ) 25 MG tablet take 1 tab by mouth daily 100 tablet 0   meloxicam  (MOBIC ) 15 MG tablet Take 15 mg by mouth daily.     metoprolol  succinate (TOPROL -XL) 25 MG 24 hr tablet Take 25 mg by mouth daily.     montelukast  (SINGULAIR ) 10 MG tablet Take 1 tablet (10 mg total) by mouth daily. 30 tablet 2   Multiple Vitamin (MULTIVITAMIN WITH MINERALS) TABS tablet Take 1 tablet by mouth daily.     omeprazole  (PRILOSEC) 20 MG capsule Take 20 mg by mouth daily.     ondansetron  (ZOFRAN ) 4 MG tablet Take 1 tablet (4 mg total) by mouth every 8 (eight) hours as needed for nausea. 15 tablet 0   ONETOUCH ULTRA test strip daily.     potassium chloride  SA (KLOR-CON  M) 20 MEQ tablet Take 20 mEq by mouth daily.     traZODone  (DESYREL ) 50 MG tablet Take 50 mg by mouth at bedtime.     buPROPion  (WELLBUTRIN  XL) 150 MG 24 hr tablet Take 150 mg by mouth daily.     venlafaxine  (EFFEXOR ) 75 MG tablet Take 75 mg by mouth daily.     venlafaxine  XR (EFFEXOR -XR) 150 MG 24 hr capsule take 1 capsule BY MOUTH daily, in addition to 75mg  for total of 225mg  a day] 90 capsule 0   venlafaxine  XR (EFFEXOR -XR) 75 MG 24 hr capsule take 1 capsule daily, in addition to 150mg  for total dose of 225mg ] 90 capsule  0   No facility-administered medications prior to visit.    Allergies  Allergen Reactions   Anise Extract [Flavoring Agent] Hives and Swelling    Review of Systems  Constitutional:  Negative for chills, fatigue and fever.  HENT:  Negative for congestion, ear pain, rhinorrhea  and sore throat.        Dry lips   Respiratory:  Negative for cough and shortness of breath.   Cardiovascular:  Negative for chest pain.  Gastrointestinal:  Negative for abdominal pain, constipation, diarrhea, nausea and vomiting.  Genitourinary:  Negative for dysuria and urgency.  Musculoskeletal:  Negative for back pain and myalgias.  Neurological:  Negative for dizziness, weakness, light-headedness and headaches.  Psychiatric/Behavioral:  Positive for dysphoric mood. The patient is not nervous/anxious.        Objective:        01/08/2023   10:24 AM 12/18/2022    1:29 PM 07/10/2022    1:48 PM  Vitals with BMI  Height 5' 5 5' 5   Weight 203 lbs 6 oz 203 lbs 6 oz   BMI 33.85 33.85   Systolic 114 102 887  Diastolic 72 68 51  Pulse 76 75 76    No data found.   Physical Exam Vitals reviewed.  Constitutional:      Appearance: Normal appearance. She is normal weight.  HENT:     Mouth/Throat:     Comments: Dry chapped crusty upper lip. No sores noted.  Neck:     Vascular: No carotid bruit.  Cardiovascular:     Rate and Rhythm: Normal rate and regular rhythm.     Heart sounds: Normal heart sounds.  Pulmonary:     Effort: Pulmonary effort is normal. No respiratory distress.     Breath sounds: Normal breath sounds.  Abdominal:     General: Abdomen is flat. Bowel sounds are normal.     Palpations: Abdomen is soft.     Tenderness: There is no abdominal tenderness.  Neurological:     Mental Status: She is alert and oriented to person, place, and time.  Psychiatric:        Mood and Affect: Mood normal.        Behavior: Behavior normal.     Health Maintenance Due  Topic Date Due   Medicare  Annual Wellness (AWV)  Never done   HIV Screening  Never done   Hepatitis C Screening  Never done   Cervical Cancer Screening (HPV/Pap Cotest)  Never done    There are no preventive care reminders to display for this patient.   Lab Results  Component Value Date   TSH 4.20 01/12/2021   Lab Results  Component Value Date   WBC 8.4 07/03/2022   HGB 12.7 07/03/2022   HCT 38.7 07/03/2022   MCV 89.2 07/03/2022   PLT 290 07/03/2022   Lab Results  Component Value Date   NA 137 07/03/2022   K 4.5 07/03/2022   CO2 27 07/03/2022   GLUCOSE 100 (H) 07/03/2022   BUN 21 (H) 07/03/2022   CREATININE 1.01 (H) 07/03/2022   BILITOT 0.7 09/28/2020   ALKPHOS 111 09/28/2020   AST 12 09/28/2020   ALT 12 09/28/2020   PROT 7.2 09/28/2020   ALBUMIN 4.0 09/28/2020   CALCIUM  9.9 07/03/2022   ANIONGAP 10 07/03/2022   GFR 76.78 09/28/2020   No results found for: CHOL No results found for: HDL No results found for: LDLCALC No results found for: TRIG No results found for: CHOLHDL No results found for: YHAJ8R     Assessment & Plan:  Cheilitis Assessment & Plan: Presents with chapped and dry lips, primarily affecting the upper lip, with soreness, burning, and pain for a couple of days. Alcohol  application exacerbated dryness. Differential diagnosis includes dehydration and possible yeast  infection from constant lip licking. Discussed risks of untreated condition, including worsening dryness and potential infection. Benefits of hydration and lip balm use include symptom relief and prevention of complications. If symptoms persist, ketoconazole  cream may be necessary for potential yeast infection.   - Recommend staying well hydrated   - Apply a good lip balm such as Burt's Bees several times a day   - If symptoms do not improve in 3-4 days,  topical cream twice daily for a week   - Send prescription for ketoconazole -steroid combination cream to [facility]     Major depressive  disorder, recurrent, moderate (HCC) Assessment & Plan: Reports feeling depressed, particularly due to issues with her daughters. Scored high on the depression scale. On Wellbutrin  150 mg and Effexor  (venlafaxine ) 225 mg daily, but has run out of Effexor  for the past three days, likely contributing to current mood. Discussed importance of medication adherence for effective depression management. Benefits of continuing medication include mood stabilization and improved daily functioning. Risks of non-adherence include worsening depression and potential relapse.   - Refill Effexor  XR 150 mg and 75 mg for a total of 225 mg daily, 90 tablets with a refill   - Refill Wellbutrin  150 mg, 90 tablets with a refill   - Advise to call ahead of time for refills to avoid running out of medication   - Follow-up appointment scheduled for March 21, 2023.  Orders: -     Venlafaxine  HCl ER; Take 1 capsule (150 mg total) by mouth daily with breakfast. Along with 75 mg XR for total of 225 mg  Dispense: 90 capsule; Refill: 1 -     Venlafaxine  HCl ER; Take 1 capsule (75 mg total) by mouth daily with breakfast. To take along with 150 mg XR for total of 225 mg  Dispense: 90 capsule; Refill: 0  Other orders -     ketoconazole  2%-triamcinolone  0.1% 1:2 cream mixture; Apply topically 2 (two) times daily for 14 days.  Dispense: 45 g; Refill: 0 -     buPROPion  HCl ER (XL); Take 1 tablet (150 mg total) by mouth daily.  Dispense: 90 tablet; Refill: 1     Meds ordered this encounter  Medications   ketoconazole  2%-triamcinolone  0.1% 1:2 cream mixture    Sig: Apply topically 2 (two) times daily for 14 days.    Dispense:  45 g    Refill:  0   buPROPion  (WELLBUTRIN  XL) 150 MG 24 hr tablet    Sig: Take 1 tablet (150 mg total) by mouth daily.    Dispense:  90 tablet    Refill:  1   venlafaxine  XR (EFFEXOR -XR) 150 MG 24 hr capsule    Sig: Take 1 capsule (150 mg total) by mouth daily with breakfast. Along with 75 mg XR for  total of 225 mg    Dispense:  90 capsule    Refill:  1   venlafaxine  XR (EFFEXOR -XR) 75 MG 24 hr capsule    Sig: Take 1 capsule (75 mg total) by mouth daily with breakfast. To take along with 150 mg XR for total of 225 mg    Dispense:  90 capsule    Refill:  0    No orders of the defined types were placed in this encounter.    Follow-up: No follow-ups on file.  An After Visit Summary was printed and given to the patient.  Savior Himebaugh, MD [facility] [phone]

## 2023-01-08 NOTE — Patient Instructions (Addendum)
 Chapped and dry lips could happen due to dehydration or not drinking enough water  or sometimes yeast infection from constantly licking your lips. Trying to stay well-hydrated and drink plenty of water  Apply a good lip balm such as BURTS BEES OR CARMAX several times a day on the lips If this does not work in the next 3 to 4 days , try the cream I prescribed twice daily for 1-2 weeks Refilled both your effexor  pills. Continue them and wellbutrin . Return in march

## 2023-01-08 NOTE — Telephone Encounter (Signed)
 Copied from CRM [id number]. Topic: Clinical - Medication Question >> Jan 08, 2023 11:27 AM Adelina Mings wrote: Reason for CRM: [facility] does not compound, would nurse like to change to individual cream or change to a compound pharmacy

## 2023-01-08 NOTE — Assessment & Plan Note (Signed)
 Presents with chapped and dry lips, primarily affecting the upper lip, with soreness, burning, and pain for a couple of days. Alcohol  application exacerbated dryness. Differential diagnosis includes dehydration and possible yeast infection from constant lip licking. Discussed risks of untreated condition, including worsening dryness and potential infection. Benefits of hydration and lip balm use include symptom relief and prevention of complications. If symptoms persist, ketoconazole  cream may be necessary for potential yeast infection.   - Recommend staying well hydrated   - Apply a good lip balm such as Burt's Bees several times a day   - If symptoms do not improve in 3-4 days,  topical cream twice daily for a week   - Send prescription for ketoconazole -steroid combination cream to [facility]

## 2023-01-12 ENCOUNTER — Other Ambulatory Visit: Payer: Self-pay

## 2023-01-12 DIAGNOSIS — F331 Major depressive disorder, recurrent, moderate: Secondary | ICD-10-CM

## 2023-01-12 DIAGNOSIS — K219 Gastro-esophageal reflux disease without esophagitis: Secondary | ICD-10-CM

## 2023-01-23 ENCOUNTER — Telehealth: Payer: Self-pay

## 2023-01-23 NOTE — Telephone Encounter (Signed)
I notified the patient of the information in the image below. The patient was unable to log into her mychart. I reset her password.

## 2023-02-20 ENCOUNTER — Other Ambulatory Visit: Payer: Self-pay

## 2023-02-20 DIAGNOSIS — E1169 Type 2 diabetes mellitus with other specified complication: Secondary | ICD-10-CM

## 2023-03-21 ENCOUNTER — Ambulatory Visit: Payer: Medicare HMO

## 2023-03-21 VITALS — BP 120/72 | HR 74 | Temp 98.0°F | Ht 65.0 in | Wt 200.6 lb

## 2023-03-21 DIAGNOSIS — F331 Major depressive disorder, recurrent, moderate: Secondary | ICD-10-CM

## 2023-03-21 DIAGNOSIS — E1169 Type 2 diabetes mellitus with other specified complication: Secondary | ICD-10-CM | POA: Insufficient documentation

## 2023-03-21 DIAGNOSIS — L309 Dermatitis, unspecified: Secondary | ICD-10-CM | POA: Diagnosis not present

## 2023-03-21 DIAGNOSIS — G4709 Other insomnia: Secondary | ICD-10-CM | POA: Diagnosis not present

## 2023-03-21 DIAGNOSIS — E119 Type 2 diabetes mellitus without complications: Secondary | ICD-10-CM

## 2023-03-21 DIAGNOSIS — I10 Essential (primary) hypertension: Secondary | ICD-10-CM

## 2023-03-21 DIAGNOSIS — K5909 Other constipation: Secondary | ICD-10-CM

## 2023-03-21 DIAGNOSIS — E785 Hyperlipidemia, unspecified: Secondary | ICD-10-CM

## 2023-03-21 LAB — HEMOGLOBIN A1C
Est. average glucose Bld gHb Est-mCnc: 123 mg/dL
Hgb A1c MFr Bld: 5.9 % — ABNORMAL HIGH (ref 4.8–5.6)

## 2023-03-21 LAB — LIPID PANEL
Chol/HDL Ratio: 2.3 ratio (ref 0.0–4.4)
Cholesterol, Total: 122 mg/dL (ref 100–199)
HDL: 52 mg/dL (ref >39)
LDL Chol Calc (NIH): 53 mg/dL (ref 0–99)
Triglycerides: 87 mg/dL (ref 0–149)
VLDL Cholesterol Cal: 17 mg/dL (ref 5–40)

## 2023-03-21 LAB — COMPREHENSIVE METABOLIC PANEL
ALT: 12 IU/L (ref 0–32)
AST: 14 IU/L (ref 0–40)
Albumin: 4 g/dL (ref 3.8–4.9)
Alkaline Phosphatase: 103 IU/L (ref 44–121)
BUN/Creatinine Ratio: 16 (ref 12–28)
BUN: 15 mg/dL (ref 8–27)
Bilirubin Total: 0.5 mg/dL (ref 0.0–1.2)
CO2: 25 mmol/L (ref 20–29)
Calcium: 8.8 mg/dL (ref 8.7–10.3)
Chloride: 98 mmol/L (ref 96–106)
Creatinine, Ser: 0.91 mg/dL (ref 0.57–1.00)
Globulin, Total: 2.1 g/dL (ref 1.5–4.5)
Glucose: 111 mg/dL — ABNORMAL HIGH (ref 70–99)
Potassium: 3.6 mmol/L (ref 3.5–5.2)
Sodium: 140 mmol/L (ref 134–144)
Total Protein: 6.1 g/dL (ref 6.0–8.5)
eGFR: 72 mL/min/{1.73_m2} (ref >59)

## 2023-03-21 LAB — CBC WITH DIFFERENTIAL/PLATELET
Basophils Absolute: 0 10*3/uL (ref 0.0–0.2)
Basos: 1 %
EOS (ABSOLUTE): 0.3 10*3/uL (ref 0.0–0.4)
Eos: 4 %
Hematocrit: 35.5 % (ref 34.0–46.6)
Hemoglobin: 11.6 g/dL (ref 11.1–15.9)
Immature Grans (Abs): 0 10*3/uL (ref 0.0–0.1)
Immature Granulocytes: 0 %
Lymphocytes Absolute: 2.1 10*3/uL (ref 0.7–3.1)
Lymphs: 30 %
MCH: 29 pg (ref 26.6–33.0)
MCHC: 32.7 g/dL (ref 31.5–35.7)
MCV: 89 fL (ref 79–97)
Monocytes Absolute: 0.7 10*3/uL (ref 0.1–0.9)
Monocytes: 10 %
Neutrophils Absolute: 3.8 10*3/uL (ref 1.4–7.0)
Neutrophils: 55 %
Platelets: 227 10*3/uL (ref 150–450)
RBC: 4 x10E6/uL (ref 3.77–5.28)
RDW: 13.1 % (ref 11.7–15.4)
WBC: 7 10*3/uL (ref 3.4–10.8)

## 2023-03-21 MED ORDER — VENLAFAXINE HCL ER 75 MG PO CP24: 75.0000 mg | ORAL_CAPSULE | Freq: Every day | ORAL | 1 refills | Status: DC

## 2023-03-21 MED ORDER — BUPROPION HCL ER (XL) 150 MG PO TB24: 150.0000 mg | ORAL_TABLET | Freq: Every day | ORAL | 1 refills | Status: DC

## 2023-03-21 MED ORDER — LOSARTAN POTASSIUM 25 MG PO TABS: 25.0000 mg | ORAL_TABLET | Freq: Every day | ORAL | 1 refills | Status: DC

## 2023-03-21 MED ORDER — MELOXICAM 15 MG PO TABS: 15.0000 mg | ORAL_TABLET | Freq: Every day | ORAL | 1 refills | Status: DC

## 2023-03-21 MED ORDER — POTASSIUM CHLORIDE CRYS ER 20 MEQ PO TBCR: 20.0000 meq | EXTENDED_RELEASE_TABLET | Freq: Every day | ORAL | 1 refills | Status: DC

## 2023-03-21 MED ORDER — METOPROLOL SUCCINATE ER 25 MG PO TB24: 25.0000 mg | ORAL_TABLET | Freq: Every day | ORAL | 1 refills | Status: DC

## 2023-03-21 MED ORDER — ONDANSETRON HCL 4 MG PO TABS: 4.0000 mg | ORAL_TABLET | Freq: Three times a day (TID) | ORAL | 0 refills | Status: AC | PRN

## 2023-03-21 MED ORDER — ALBUTEROL SULFATE HFA 108 (90 BASE) MCG/ACT IN AERS: 2.0000 | INHALATION_SPRAY | RESPIRATORY_TRACT | 1 refills | Status: AC | PRN

## 2023-03-21 MED ORDER — ATORVASTATIN CALCIUM 20 MG PO TABS: 20.0000 mg | ORAL_TABLET | Freq: Every day | ORAL | 1 refills | Status: DC

## 2023-03-21 MED ORDER — VENLAFAXINE HCL ER 150 MG PO CP24: 150.0000 mg | ORAL_CAPSULE | Freq: Every day | ORAL | 1 refills | Status: DC

## 2023-03-21 MED ORDER — FERROUS SULFATE 325 (65 FE) MG PO TBEC: 325.0000 mg | DELAYED_RELEASE_TABLET | Freq: Every day | ORAL | 1 refills | Status: AC

## 2023-03-21 NOTE — Assessment & Plan Note (Signed)
 Reports constipation with bowel movements once or twice a week, bloating, and dark blood in stools. Miralax use once a week is ineffective. Recent colonoscopy in January 2024 showed multiple small mouth diverticulae in the sigmoid colon, internal hemorrhoids, and about 3 polyps removed and was recommended a repeat colonoscopy in 3 years which is January 2027.   Prefers lifestyle modifications to avoid diarrhea from medications. - Increase water intake - Increase fiber intake with fruits and vegetables - Increase Miralax to every other day, then daily if needed - Monitor bowel movement frequency and report if no improvement

## 2023-03-21 NOTE — Assessment & Plan Note (Signed)
 On trazodone 50 mg daily at bedtime Encourage increased activity and exercise to help sleep better

## 2023-03-21 NOTE — Assessment & Plan Note (Signed)
 Patient reports she has DIABETES TYPE 2 CONTROLLED WITHOUT COMPLICATIONS.   Blood sugar levels are well-controlled. NOT ON ANY MEDICATIONS. - Order blood work to check current blood sugar levels

## 2023-03-21 NOTE — Assessment & Plan Note (Signed)
 On lipitor 20 mg daily Will repeat lipids

## 2023-03-21 NOTE — Assessment & Plan Note (Signed)
 Presents with dry skin and fissures on fingertips, which are bleeding. Uses Dove soap and cold water for handwashing and applies Carecure lotion. Condition is expected to improve with warmer weather. - Apply over-the-counter hydrocortisone cream to affected areas twice daily, especially after chores and before bed - Continue using a good moisturizing lotion - Avoid hot water for handwashing

## 2023-03-21 NOTE — Patient Instructions (Signed)
 VISIT SUMMARY:  Today, we discussed your concerns about dry, cracked fingers and constipation. We also reviewed your diabetes management, asthma, depression, and sleep apnea. A comprehensive plan was created to address each of these issues.  YOUR PLAN:  -DRY SKIN WITH FISSURES ON FINGERS: Your dry, cracked fingers can be managed by applying over-the-counter hydrocortisone cream twice daily, especially after chores and before bed. Continue using a good moisturizing lotion and avoid using hot water for handwashing. This condition is expected to improve with warmer weather.  -CHRONIC CONSTIPATION: Constipation means having infrequent or hard-to-pass bowel movements. To help with this, increase your water and fiber intake by eating more fruits and vegetables. Use Miralax every other day, and then daily if needed. Monitor your bowel movements and let us know if there is no improvement.  -DIABETES MELLITUS: Your diabetes is well-controlled, but we will order blood work to check your current blood sugar levels to ensure everything remains stable.  -ASTHMA: Asthma is a condition that affects your airways and can cause difficulty breathing. You will continue using your albuterol inhaler as needed. A new prescription for the inhaler will be provided.  -DEPRESSION: Depression is a mood disorder that you are managing well with venlafaxine (Effexor) 225 mg daily. Continue taking this medication as prescribed.  -SLEEP APNEA: Sleep apnea is a condition where your breathing stops and starts during sleep. It is important for your heart health to manage this condition. We will investigate your previous visits with Dr. Delbert Phenix to address the issue and explore getting you a CPAP machine again.  INSTRUCTIONS:  Please follow up with [place] if your constipation does not improve with the recommended lifestyle changes. We will also contact you with the results of your blood work for diabetes management. Additionally, we  will look into your previous visits with Dr. Delbert Phenix regarding your sleep apnea management.

## 2023-03-21 NOTE — Assessment & Plan Note (Signed)
 On venlafaxine (Effexor) 225 mg for mood and depression, which is effective. - Continue venlafaxine (Effexor) 225 mg daily

## 2023-03-21 NOTE — Progress Notes (Addendum)
 Subjective:  Patient ID: Connie Garrett, female    DOB: 22-Nov-1960  Age: 63 y.o. MRN: [id number]  Chief Complaint  Patient presents with   Medical Management of Chronic Issues    Discussed the use of AI scribe software for clinical note transcription with the patient, who gave verbal consent to proceed.   Connie Garrett is a 63 year old female who presents with dry, cracked fingers and constipation.  She experiences dry, cracked fingers at the tips, sometimes leading to bleeding. She uses Dial soap with cold water for handwashing and applies a 24-hour lotion called Carecure. She has not used any cortisone cream on her fingers.  She experiences constipation and bloating, with bowel movements occurring only once or twice a week. She uses Miralax, but it does not seem effective. She recalls having a colonoscopy in January 2024, which revealed multiple small mouth diverticulae in the sigmoid colon, internal hemorrhoids, and three polyps that were removed. She reports dark blood in her stools and has to strain during bowel movements. No pain or discomfort, and no swelling in her legs.  She has a history of diabetes, which was previously mentioned by Doctor Yetta Flock, although she is unsure of the current status as her blood sugar levels have been described as 'beautiful'.  She has a history of sleep apnea but does not currently have a CPAP machine, as it was taken back due to insufficient use. She felt 'smothered' by the mask.  She uses an albuterol inhaler as needed, which is infrequent, and does not use a nebulizer solution. She takes Trazodone  at night to aid with sleep, although it does not help her sleep through the night. She also takes venlafaxine 225 mg for mood and depression, which she feels is helpful.      03/21/2023    8:44 AM 01/08/2023   10:30 AM 07/15/2020   12:23 PM  Depression screen PHQ 2/9  Decreased Interest 3 3 2   Down, Depressed, Hopeless 3 1 2   PHQ - 2 Score 6 4 4    Altered sleeping 3 2 2   Tired, decreased energy 3 3 3   Change in appetite 3 3 3   Feeling bad or failure about yourself  0 0 0  Trouble concentrating 0 0 0  Moving slowly or fidgety/restless 0 3 0  Suicidal thoughts 0 0 0  PHQ-9 Score 15 15 12   Difficult doing work/chores Somewhat difficult Not difficult at all Somewhat difficult        03/21/2023    8:44 AM  Fall Risk   Falls in the past year? 0  Number falls in past yr: 0  Injury with Fall? 0  Risk for fall due to : No Fall Risks    Patient Care Team: Windell Moment, MD as PCP - General (Family Medicine)   Review of Systems  Constitutional:  Negative for chills, fatigue and fever.  HENT:  Negative for congestion, ear pain, sinus pressure and sore throat.   Respiratory:  Negative for cough.   Cardiovascular:  Negative for chest pain.  Gastrointestinal:  Positive for constipation. Negative for abdominal pain, diarrhea, nausea and vomiting.  Genitourinary:  Negative for dysuria and frequency.  Musculoskeletal:  Negative for arthralgias, back pain and myalgias.  Neurological:  Negative for dizziness and headaches.  Psychiatric/Behavioral:  Negative for dysphoric mood. The patient is not nervous/anxious.     Current Outpatient Medications on File Prior to Visit  Medication Sig Dispense Refill   montelukast (SINGULAIR) 10  MG tablet Take 1 tablet (10 mg total) by mouth daily. 30 tablet 2   Multiple Vitamin (MULTIVITAMIN WITH MINERALS) TABS tablet Take 1 tablet by mouth daily.     omeprazole (PRILOSEC) 20 MG capsule TAKE ONE CAPSULE BY MOUTH EVERY DAY 30 capsule 2   ONETOUCH ULTRA test strip daily.     traZODone (DESYREL) 50 MG tablet take 1 (one) tablet by mouth at bedtime 30 tablet 2   No current facility-administered medications on file prior to visit.   Past Medical History:  Diagnosis Date   Anxiety    Arthritis    Cataract    Celiac disease    suspected   Colon polyps    Depression    GERD (gastroesophageal  reflux disease)    Hypertension    Osteoporosis    Pre-diabetes    Sleep apnea    Past Surgical History:  Procedure Laterality Date   ABDOMINAL HYSTERECTOMY  1992   BACK SURGERY  1990   colonosocpy     ESOPHAGOGASTRODUODENOSCOPY     EYE SURGERY     TOTAL KNEE ARTHROPLASTY Right 07/18/2018   Procedure: TOTAL KNEE ARTHROPLASTY;  Surgeon: Jodi Geralds, MD;  Location: [facility];  Service: Orthopedics;  Laterality: Right;   TOTAL KNEE ARTHROPLASTY Left 07/09/2022   Procedure: TOTAL KNEE ARTHROPLASTY;  Surgeon: Jodi Geralds, MD;  Location: [facility];  Service: Orthopedics;  Laterality: Left;   WRIST FRACTURE SURGERY Left    x 2    Family History  Problem Relation Age of Onset   Diabetes Mother    Colon cancer Neg Hx    Colon polyps Neg Hx    Rectal cancer Neg Hx    Esophageal cancer Neg Hx    Stomach cancer Neg Hx    Social History   Socioeconomic History   Marital status: Single    Spouse name: Not on file   Number of children: 2   Years of education: Not on file   Highest education level: Not on file  Occupational History   Occupation: Homemaker  Tobacco Use   Smoking status: Never   Smokeless tobacco: Never  Vaping Use   Vaping status: Never Used  Substance and Sexual Activity   Alcohol use: Never   Drug use: Never   Sexual activity: Not Currently  Other Topics Concern   Not on file  Social History Narrative   Not on file   Social Drivers of Health   Financial Resource Strain: Low Risk  (01/08/2023)   Overall Financial Resource Strain (CARDIA)    Difficulty of Paying Living Expenses: Not hard at all  Food Insecurity: No Food Insecurity (07/09/2022)   Hunger Vital Sign    Worried About Running Out of Food in the Last Year: Never true    Ran Out of Food in the Last Year: Never true  Transportation Needs: No Transportation Needs (07/09/2022)   PRAPARE - Administrator, Civil Service (Medical): No    Lack of Transportation (Non-Medical): No  Physical  Activity: Inactive (01/08/2023)   Exercise Vital Sign    Days of Exercise per Week: 0 days    Minutes of Exercise per Session: 0 min  Stress: No Stress Concern Present (01/08/2023)   [employer] of Occupational Health - Occupational Stress Questionnaire    Feeling of Stress : Not at all  Social Connections: Not on file    Objective:  BP 120/72   Pulse 74   Temp 98 F (36.7 C)  Ht 5\' 5"  (1.651 m)   Wt 200 lb 9.6 oz (91 kg)   SpO2 96%   BMI 33.38 kg/m      03/21/2023    8:38 AM 01/08/2023   10:24 AM 12/18/2022    1:29 PM  BP/Weight  Systolic BP 120 114 102  Diastolic BP 72 72 68  Wt. (Lbs) 200.6 203.4 203.4  BMI 33.38 kg/m2 33.85 kg/m2 33.85 kg/m2    Physical Exam Vitals and nursing note reviewed.  Constitutional:      Appearance: She is obese.  HENT:     Head: Normocephalic and atraumatic.  Cardiovascular:     Rate and Rhythm: Normal rate and regular rhythm.  Pulmonary:     Effort: Pulmonary effort is normal.     Breath sounds: Normal breath sounds.  Musculoskeletal:        General: Normal range of motion.     Cervical back: Normal range of motion.  Skin:    General: Skin is warm.  Neurological:     General: No focal deficit present.     Mental Status: She is alert.  Psychiatric:        Mood and Affect: Mood normal.     Diabetic Foot Exam - Simple   No data filed      Lab Results  Component Value Date   WBC 7.0 03/21/2023   HGB 11.6 03/21/2023   HCT 35.5 03/21/2023   PLT 227 03/21/2023   GLUCOSE 111 (H) 03/21/2023   CHOL 122 03/21/2023   TRIG 87 03/21/2023   HDL 52 03/21/2023   LDLCALC 53 03/21/2023   ALT 12 03/21/2023   AST 14 03/21/2023   NA 140 03/21/2023   K 3.6 03/21/2023   CL 98 03/21/2023   CREATININE 0.91 03/21/2023   BUN 15 03/21/2023   CO2 25 03/21/2023   TSH 4.20 01/12/2021   INR 1.0 07/15/2018   HGBA1C 5.9 (H) 03/21/2023      Assessment & Plan:  Assessment and Plan       Hyperlipidemia LDL goal <130 Assessment  & Plan: On lipitor 20 mg daily Will repeat lipids   Type 2 diabetes mellitus without complication, without long-term current use of insulin (HCC) Assessment & Plan: Patient reports she has DIABETES TYPE 2 CONTROLLED WITHOUT COMPLICATIONS.   Blood sugar levels are well-controlled. NOT ON ANY MEDICATIONS. - Order blood work to check current blood sugar levels   Orders: -     Atorvastatin Calcium; Take 1 tablet (20 mg total) by mouth at bedtime.  Dispense: 90 tablet; Refill: 1 -     CBC with Differential/Platelet -     Comprehensive metabolic panel -     Lipid panel -     Hemoglobin A1c  Essential (primary) hypertension -     Losartan Potassium; Take 1 tablet (25 mg total) by mouth daily.  Dispense: 90 tablet; Refill: 1 -     CBC with Differential/Platelet -     Comprehensive metabolic panel -     Lipid panel -     Hemoglobin A1c  Major depressive disorder, recurrent, moderate (HCC) Assessment & Plan: On venlafaxine (Effexor) 225 mg for mood and depression, which is effective. - Continue venlafaxine (Effexor) 225 mg daily   Orders: -     Venlafaxine HCl ER; Take 1 capsule (150 mg total) by mouth daily with breakfast. Along with 75 mg XR for total of 225 mg  Dispense: 90 capsule; Refill: 1 -     Venlafaxine  HCl ER; Take 1 capsule (75 mg total) by mouth daily with breakfast. To take along with 150 mg XR for total of 225 mg  Dispense: 90 capsule; Refill: 1  Other insomnia Assessment & Plan: On trazodone 50 mg daily at bedtime Encourage increased activity and exercise to help sleep better   Eczema of both hands Assessment & Plan: Presents with dry skin and fissures on fingertips, which are bleeding. Uses Dove soap and cold water for handwashing and applies Carecure lotion. Condition is expected to improve with warmer weather. - Apply over-the-counter hydrocortisone cream to affected areas twice daily, especially after chores and before bed - Continue using a good moisturizing  lotion - Avoid hot water for handwashing    Chronic constipation Assessment & Plan: Reports constipation with bowel movements once or twice a week, bloating, and dark blood in stools. Miralax use once a week is ineffective. Recent colonoscopy in January 2024 showed multiple small mouth diverticulae in the sigmoid colon, internal hemorrhoids, and about 3 polyps removed and was recommended a repeat colonoscopy in 3 years which is January 2027.   Prefers lifestyle modifications to avoid diarrhea from medications. - Increase water intake - Increase fiber intake with fruits and vegetables - Increase Miralax to every other day, then daily if needed - Monitor bowel movement frequency and report if no improvement   Other orders -     Albuterol Sulfate HFA; Inhale 2 puffs into the lungs every 4 (four) hours as needed for wheezing or shortness of breath.  Dispense: 1 each; Refill: 1 -     buPROPion HCl ER (XL); Take 1 tablet (150 mg total) by mouth daily.  Dispense: 90 tablet; Refill: 1 -     Ferrous Sulfate; Take 1 tablet (325 mg total) by mouth daily with breakfast.  Dispense: 90 tablet; Refill: 1 -     Meloxicam; Take 1 tablet (15 mg total) by mouth daily.  Dispense: 90 tablet; Refill: 1 -     Metoprolol Succinate ER; Take 1 tablet (25 mg total) by mouth daily.  Dispense: 90 tablet; Refill: 1 -     Ondansetron HCl; Take 1 tablet (4 mg total) by mouth every 8 (eight) hours as needed for nausea.  Dispense: 15 tablet; Refill: 0 -     Potassium Chloride Crys ER; Take 1 tablet (20 mEq total) by mouth daily.  Dispense: 90 tablet; Refill: 1     Meds ordered this encounter  Medications   albuterol (VENTOLIN HFA) 108 (90 Base) MCG/ACT inhaler    Sig: Inhale 2 puffs into the lungs every 4 (four) hours as needed for wheezing or shortness of breath.    Dispense:  1 each    Refill:  1   atorvastatin (LIPITOR) 20 MG tablet    Sig: Take 1 tablet (20 mg total) by mouth at bedtime.    Dispense:  90 tablet     Refill:  1   buPROPion (WELLBUTRIN XL) 150 MG 24 hr tablet    Sig: Take 1 tablet (150 mg total) by mouth daily.    Dispense:  90 tablet    Refill:  1   ferrous sulfate 325 (65 FE) MG EC tablet    Sig: Take 1 tablet (325 mg total) by mouth daily with breakfast.    Dispense:  90 tablet    Refill:  1   losartan (COZAAR) 25 MG tablet    Sig: Take 1 tablet (25 mg total) by mouth daily.    Dispense:  90 tablet    Refill:  1   meloxicam (MOBIC) 15 MG tablet    Sig: Take 1 tablet (15 mg total) by mouth daily.    Dispense:  90 tablet    Refill:  1   metoprolol succinate (TOPROL-XL) 25 MG 24 hr tablet    Sig: Take 1 tablet (25 mg total) by mouth daily.    Dispense:  90 tablet    Refill:  1   ondansetron (ZOFRAN) 4 MG tablet    Sig: Take 1 tablet (4 mg total) by mouth every 8 (eight) hours as needed for nausea.    Dispense:  15 tablet    Refill:  0   potassium chloride SA (KLOR-CON M) 20 MEQ tablet    Sig: Take 1 tablet (20 mEq total) by mouth daily.    Dispense:  90 tablet    Refill:  1   venlafaxine XR (EFFEXOR-XR) 150 MG 24 hr capsule    Sig: Take 1 capsule (150 mg total) by mouth daily with breakfast. Along with 75 mg XR for total of 225 mg    Dispense:  90 capsule    Refill:  1   venlafaxine XR (EFFEXOR-XR) 75 MG 24 hr capsule    Sig: Take 1 capsule (75 mg total) by mouth daily with breakfast. To take along with 150 mg XR for total of 225 mg    Dispense:  90 capsule    Refill:  1    Orders Placed This Encounter  Procedures   CBC with Differential   Comprehensive metabolic panel   Lipid Panel   Hemoglobin A1c     Follow-up: No follow-ups on file.    An After Visit Summary was printed and given to the patient.  Windell Moment, MD [facility] [phone]

## 2023-04-04 ENCOUNTER — Other Ambulatory Visit: Payer: Self-pay

## 2023-04-04 MED ORDER — MONTELUKAST SODIUM 10 MG PO TABS: 10.0000 mg | ORAL_TABLET | Freq: Every day | ORAL | 2 refills | Status: DC

## 2023-04-10 ENCOUNTER — Ambulatory Visit: Payer: Self-pay

## 2023-04-10 ENCOUNTER — Ambulatory Visit: Admitting: Family Medicine

## 2023-04-10 NOTE — Telephone Encounter (Signed)
 Copied from CRM [id number]. Topic: Clinical - Red Word Triage >> Apr 10, 2023 11:05 AM Fuller Mandril wrote: Red Word that prompted transfer to Nurse Triage: Legs feet swelling, just started- hard to walk.  Chief Complaint: Leg swelling Symptoms: Discomfort when walking, occasional tingling Frequency: A week Pertinent Negatives: Patient denies redness Disposition: [] ED /[] Urgent Care (no appt availability in office) / [x] Appointment(In office/virtual)/ []  [facility]/ [] Home Care/ [] Refused Recommended Disposition /[] [place] [facility]/ []  Follow-up with PCP Additional Notes: Patient called in to report bilateral leg swelling. Patient stated swelling extends up to her knees. Patient stated she experiences discomfort when walking, denied pain at rest. Denied redness, fever, difficulty breathing and chest pain at this time. Advised patient to see a provider within 24 hours, per protocol. No availability with PCP today. Scheduled patient with alternate provider in office. Provided care advice and instructed patient to call back if symptoms worsen. Patient complied.   Reason for Disposition  [1] MODERATE leg swelling (e.g., swelling extends up to knees) AND [2] new-onset or worsening  Answer Assessment - Initial Assessment Questions 1. ONSET: "When did the swelling start?" (e.g., minutes, hours, days)     A week ago 2. LOCATION: "What part of the leg is swollen?"  "Are both legs swollen or just one leg?"     Bilateral legs, left is worse 3. SEVERITY: "How bad is the swelling?" (e.g., localized; mild, moderate, severe)   - Localized: Small area of swelling localized to one leg.   - MILD pedal edema: Swelling limited to foot and ankle, pitting edema < 1/4 inch (6 mm) deep, rest and elevation eliminate most or all swelling.   - MODERATE edema: Swelling of lower leg to knee, pitting edema > 1/4 inch (6 mm) deep, rest and elevation only partially reduce swelling.   - SEVERE edema: Swelling  extends above knee, facial or hand swelling present.      Moderate swelling extends up to knee 4. REDNESS: "Does the swelling look red or infected?"     Denies 5. PAIN: "Is the swelling painful to touch?" If Yes, ask: "How painful is it?"   (Scale 1-10; mild, moderate or severe)     States swelling bothers her when she walks, denies pain at rest 6. FEVER: "Do you have a fever?" If Yes, ask: "What is it, how was it measured, and when did it start?"      Denies 7. CAUSE: "What do you think is causing the leg swelling?"     Unknown 8. MEDICAL HISTORY: "Do you have a history of blood clots (e.g., DVT), cancer, heart failure, kidney disease, or liver failure?"     History of knee replacements, states it has been at least a year since last knee replacement, denies history of blood clots and HF 9. RECURRENT SYMPTOM: "Have you had leg swelling before?" If Yes, ask: "When was the last time?" "What happened that time?"     Denies 10. OTHER SYMPTOMS: "Do you have any other symptoms?" (e.g., chest pain, difficulty breathing)      Difficulty breathing while laying flat last night- reports history of sleep apnea, denies chest pain, denies difficulty breathing at this time, occasional tingling in bilateral legs, denies cough  Also reports constipation, small BM this morning, reports last "normal" BM was weeks ago, denies nausea and vomiting  Protocols used: Leg Swelling and Edema-A-AH

## 2023-04-12 ENCOUNTER — Ambulatory Visit (INDEPENDENT_AMBULATORY_CARE_PROVIDER_SITE_OTHER)

## 2023-04-12 VITALS — BP 112/72 | HR 75 | Temp 97.9°F | Ht 65.0 in | Wt 201.0 lb

## 2023-04-12 DIAGNOSIS — R600 Localized edema: Secondary | ICD-10-CM

## 2023-04-12 DIAGNOSIS — I8393 Asymptomatic varicose veins of bilateral lower extremities: Secondary | ICD-10-CM

## 2023-04-12 MED ORDER — FUROSEMIDE 20 MG PO TABS: 20.0000 mg | ORAL_TABLET | Freq: Every day | ORAL | 0 refills | Status: DC | PRN

## 2023-04-12 NOTE — Patient Instructions (Signed)
 VISIT SUMMARY:  Today, you were seen for swelling in your legs and ankles, which has been present for about a week and is more pronounced in your left leg. You also reported new onset shortness of breath. We discussed potential causes and developed a plan to manage your symptoms.  YOUR PLAN:  -BILATERAL LOWER EXTREMITY EDEMA: Bilateral lower extremity edema means swelling in both legs. This can be caused by various factors including venous insufficiency, increased physical activity, or less likely, heart failure or deep vein thrombosis. We have prescribed Lasix (furosemide) to help reduce the swelling. Take one tablet daily in the morning for 3-4 days. Be aware that this medication may increase urination and could lead to potassium loss, but your current potassium supplement should help mitigate this. Additionally, use compression stockings, elevate your legs when resting, and monitor your sodium intake. Please report any worsening symptoms, such as increased shortness of breath, severe swelling, or redness in your legs. If your symptoms do not improve or worsen, we may consider an echocardiogram.  -SHORTNESS OF BREATH: Shortness of breath can be related to various conditions, including venous insufficiency or increased physical activity. Since there is no chest pain or fluid in your lungs, and your previous echocardiogram showed only a slightly reduced heart function, we will monitor your symptoms. Please report if your shortness of breath worsens. If your symptoms do not improve or worsen, we may consider an echocardiogram.  -VENOUS INSUFFICIENCY: Venous insufficiency means that the veins in your legs are not working as well as they should, which can cause swelling. The presence of spider veins in both legs supports this diagnosis. To help manage this, use compression stockings and elevate your legs when resting.  INSTRUCTIONS:  Please follow the prescribed treatment plan and report any worsening  symptoms, such as increased shortness of breath, severe swelling, or redness in your legs. If your symptoms do not improve or worsen, we may consider an echocardiogram.

## 2023-04-12 NOTE — Progress Notes (Signed)
 Acute Office Visit  Subjective:    Patient ID: Connie Garrett, female    DOB: 05-25-1960, 63 y.o.   MRN: [id number]  Chief Complaint  Patient presents with   Leg Swelling    Discussed the use of AI scribe software for clinical note transcription with the patient, who gave verbal consent to proceed.       HPI: Discussed the use of AI scribe software for clinical note transcription with the patient, who gave verbal consent to proceed.  History of Present Illness   Connie Garrett is a 63 year old female who presents with swelling in her legs and ankles.  She has been experiencing swelling in her legs and ankles for approximately one week, with the left leg more affected than the right. The swelling is painful and has been increasing in size despite attempts to manage it with ice and elevation. No recent travel, but she notes increased physical activity, including walking.  She reports new onset shortness of breath, recalling a similar episode in June 2022 when an echocardiogram showed her heart was pumping at 50%, slightly lower than normal. No chest pain is present, but shortness of breath persists.  She is not currently on any diuretics but takes a potassium supplement.       Past Medical History:  Diagnosis Date   Anxiety    Arthritis    Cataract    Celiac disease    suspected   Colon polyps    Depression    GERD (gastroesophageal reflux disease)    Hypertension    Osteoporosis    Pre-diabetes    Sleep apnea     Past Surgical History:  Procedure Laterality Date   ABDOMINAL HYSTERECTOMY  1992   BACK SURGERY  1990   colonosocpy     ESOPHAGOGASTRODUODENOSCOPY     EYE SURGERY     TOTAL KNEE ARTHROPLASTY Right 07/18/2018   Procedure: TOTAL KNEE ARTHROPLASTY;  Surgeon: Jodi Geralds, MD;  Location: [facility];  Service: Orthopedics;  Laterality: Right;   TOTAL KNEE ARTHROPLASTY Left 07/09/2022   Procedure: TOTAL KNEE ARTHROPLASTY;  Surgeon: Jodi Geralds, MD;   Location: [facility];  Service: Orthopedics;  Laterality: Left;   WRIST FRACTURE SURGERY Left    x 2    Family History  Problem Relation Age of Onset   Diabetes Mother    Colon cancer Neg Hx    Colon polyps Neg Hx    Rectal cancer Neg Hx    Esophageal cancer Neg Hx    Stomach cancer Neg Hx     Social History   Socioeconomic History   Marital status: Single    Spouse name: Not on file   Number of children: 2   Years of education: Not on file   Highest education level: Not on file  Occupational History   Occupation: Homemaker  Tobacco Use   Smoking status: Never   Smokeless tobacco: Never  Vaping Use   Vaping status: Never Used  Substance and Sexual Activity   Alcohol use: Never   Drug use: Never   Sexual activity: Not Currently  Other Topics Concern   Not on file  Social History Narrative   Not on file   Social Drivers of Health   Financial Resource Strain: Low Risk  (01/08/2023)   Overall Financial Resource Strain (CARDIA)    Difficulty of Paying Living Expenses: Not hard at all  Food Insecurity: No Food Insecurity (07/09/2022)   Hunger Vital Sign  Worried About Programme researcher, broadcasting/film/video in the Last Year: Never true    Ran Out of Food in the Last Year: Never true  Transportation Needs: No Transportation Needs (07/09/2022)   PRAPARE - Administrator, Civil Service (Medical): No    Lack of Transportation (Non-Medical): No  Physical Activity: Inactive (01/08/2023)   Exercise Vital Sign    Days of Exercise per Week: 0 days    Minutes of Exercise per Session: 0 min  Stress: No Stress Concern Present (01/08/2023)   [employer] of Occupational Health - Occupational Stress Questionnaire    Feeling of Stress : Not at all  Social Connections: Not on file  Intimate Partner Violence: Not At Risk (07/09/2022)   Humiliation, Afraid, Rape, and Kick questionnaire    Fear of Current or Ex-Partner: No    Emotionally Abused: No    Physically Abused: No    Sexually Abused:  No    Outpatient Medications Prior to Visit  Medication Sig Dispense Refill   albuterol (VENTOLIN HFA) 108 (90 Base) MCG/ACT inhaler Inhale 2 puffs into the lungs every 4 (four) hours as needed for wheezing or shortness of breath. 1 each 1   atorvastatin (LIPITOR) 20 MG tablet Take 1 tablet (20 mg total) by mouth at bedtime. 90 tablet 1   buPROPion (WELLBUTRIN XL) 150 MG 24 hr tablet Take 1 tablet (150 mg total) by mouth daily. 90 tablet 1   ferrous sulfate 325 (65 FE) MG EC tablet Take 1 tablet (325 mg total) by mouth daily with breakfast. 90 tablet 1   losartan (COZAAR) 25 MG tablet Take 1 tablet (25 mg total) by mouth daily. 90 tablet 1   meloxicam (MOBIC) 15 MG tablet Take 1 tablet (15 mg total) by mouth daily. 90 tablet 1   metoprolol succinate (TOPROL-XL) 25 MG 24 hr tablet Take 1 tablet (25 mg total) by mouth daily. 90 tablet 1   montelukast (SINGULAIR) 10 MG tablet Take 1 tablet (10 mg total) by mouth daily. 30 tablet 2   Multiple Vitamin (MULTIVITAMIN WITH MINERALS) TABS tablet Take 1 tablet by mouth daily.     omeprazole (PRILOSEC) 20 MG capsule TAKE ONE CAPSULE BY MOUTH EVERY DAY 30 capsule 2   ondansetron (ZOFRAN) 4 MG tablet Take 1 tablet (4 mg total) by mouth every 8 (eight) hours as needed for nausea. 15 tablet 0   ONETOUCH ULTRA test strip daily.     potassium chloride SA (KLOR-CON M) 20 MEQ tablet Take 1 tablet (20 mEq total) by mouth daily. 90 tablet 1   traZODone (DESYREL) 50 MG tablet take 1 (one) tablet by mouth at bedtime 30 tablet 2   venlafaxine XR (EFFEXOR-XR) 150 MG 24 hr capsule Take 1 capsule (150 mg total) by mouth daily with breakfast. Along with 75 mg XR for total of 225 mg 90 capsule 1   venlafaxine XR (EFFEXOR-XR) 75 MG 24 hr capsule Take 1 capsule (75 mg total) by mouth daily with breakfast. To take along with 150 mg XR for total of 225 mg 90 capsule 1   No facility-administered medications prior to visit.    Allergies  Allergen Reactions   Anise Extract  [Flavoring Agent (Non-Screening)] Hives and Swelling    Review of Systems  Constitutional:  Negative for chills, fatigue and fever.  HENT:  Negative for congestion, ear pain and sinus pain.   Respiratory:  Negative for cough and shortness of breath.   Cardiovascular:  Positive for leg swelling.  Negative for chest pain.  Gastrointestinal:  Negative for abdominal pain, constipation, diarrhea, nausea and vomiting.  Musculoskeletal:  Negative for myalgias.  Neurological:  Negative for headaches.       Objective:        04/12/2023   10:25 AM 03/21/2023    8:38 AM 01/08/2023   10:24 AM  Vitals with BMI  Height 5\' 5"  5\' 5"  5\' 5"   Weight 201 lbs 200 lbs 10 oz 203 lbs 6 oz  BMI 33.45 33.38 33.85  Systolic 112 120 161  Diastolic 72 72 72  Pulse 75 74 76    No data found.   Physical Exam Vitals and nursing note reviewed.  Constitutional:      Appearance: She is obese.  Cardiovascular:     Comments: CHEST: Lungs clear to auscultation. CARDIOVASCULAR: Heart sounds normal. EXTREMITIES: No swelling in upper thighs. Scars from knee replacements on both knees. Spider veins in both legs. Trace pitting edema in both legs. No redness in legs. Neurological:     Mental Status: She is alert.     Health Maintenance Due  Topic Date Due   Medicare Annual Wellness (AWV)  Never done   FOOT EXAM  Never done   OPHTHALMOLOGY EXAM  Never done   HIV Screening  Never done   Diabetic kidney evaluation - Urine ACR  Never done   Hepatitis C Screening  Never done   Cervical Cancer Screening (HPV/Pap Cotest)  Never done    There are no preventive care reminders to display for this patient.   Lab Results  Component Value Date   TSH 4.20 01/12/2021   Lab Results  Component Value Date   WBC 7.0 03/21/2023   HGB 11.6 03/21/2023   HCT 35.5 03/21/2023   MCV 89 03/21/2023   PLT 227 03/21/2023   Lab Results  Component Value Date   NA 140 03/21/2023   K 3.6 03/21/2023   CO2 25 03/21/2023    GLUCOSE 111 (H) 03/21/2023   BUN 15 03/21/2023   CREATININE 0.91 03/21/2023   BILITOT 0.5 03/21/2023   ALKPHOS 103 03/21/2023   AST 14 03/21/2023   ALT 12 03/21/2023   PROT 6.1 03/21/2023   ALBUMIN 4.0 03/21/2023   CALCIUM 8.8 03/21/2023   ANIONGAP 10 07/03/2022   EGFR 72 03/21/2023   GFR 76.78 09/28/2020   Lab Results  Component Value Date   CHOL 122 03/21/2023   Lab Results  Component Value Date   HDL 52 03/21/2023   Lab Results  Component Value Date   LDLCALC 53 03/21/2023   Lab Results  Component Value Date   TRIG 87 03/21/2023   Lab Results  Component Value Date   CHOLHDL 2.3 03/21/2023   Lab Results  Component Value Date   HGBA1C 5.9 (H) 03/21/2023       Assessment & Plan:  Assessment and Plan    Bilateral lower extremity edema Bilateral lower extremity edema for one week, more pronounced in the left leg, associated with shortness of breath. Differential diagnosis includes venous insufficiency, increased physical activity, and less likely heart failure or deep vein thrombosis. Echocardiogram from 2022 showed slightly reduced ejection fraction at 50%, but current symptoms and examination do not strongly suggest heart failure. No signs of deep vein thrombosis as entire leg is not swollen, red, or very painful. Recent kidney function tests were normal, ruling out renal causes. - Prescribe Lasix (furosemide) 30 tablets, 1 tablet daily in the morning for 3-4 days, with caution  for increased urination and potential potassium loss, mitigated by current potassium supplementation. - Advise use of compression stockings to manage venous insufficiency. - Recommend leg elevation when resting. - Monitor sodium intake in diet. - Instruct to report if symptoms worsen, including increased shortness of breath, severe swelling, or redness in legs. - Consider echocardiogram if symptoms do not improve or worsen.  Shortness of breath New onset shortness of breath associated with  bilateral leg swelling. No chest pain or fluid in lungs on examination. Previous echocardiogram showed slightly reduced ejection fraction but current symptoms do not strongly suggest heart failure. Shortness of breath could be related to venous insufficiency or increased physical activity. - Monitor symptoms and report if shortness of breath worsens. - Consider echocardiogram if symptoms do not improve or worsen.  Venous insufficiency Presence of spider veins in both legs suggests venous insufficiency, contributing to leg swelling. - Advise use of compression stockings to improve venous return and reduce swelling. - Recommend leg elevation when resting.          Follow-up: Return if symptoms worsen or fail to improve.  An After Visit Summary was printed and given to the patient.  Windell Moment, MD [facility] [phone]

## 2023-04-12 NOTE — Assessment & Plan Note (Signed)
 Presence of spider veins in both legs suggests venous insufficiency, contributing to leg swelling. - Advise use of compression stockings to improve venous return and reduce swelling. - Recommend leg elevation when resting.

## 2023-04-12 NOTE — Assessment & Plan Note (Addendum)
 Bilateral lower extremity edema Bilateral lower extremity edema for one week, more pronounced in the left leg, associated with shortness of breath. Differential diagnosis includes venous insufficiency, increased physical activity, and less likely heart failure or deep vein thrombosis. Echocardiogram from 2022 showed slightly reduced ejection fraction at 50%, but current symptoms and examination do not strongly suggest heart failure. No signs of deep vein thrombosis as entire leg is not swollen, red, or very painful. Recent kidney function tests were normal, ruling out renal causes. - Prescribe Lasix (furosemide) 30 tablets, 1 tablet daily in the morning for 3-4 days, with caution for increased urination and potential potassium loss, mitigated by current potassium supplementation. - Advise use of compression stockings to manage venous insufficiency. - Recommend leg elevation when resting. - Monitor sodium intake in diet. - Instruct to report if symptoms worsen, including increased shortness of breath, severe swelling, or redness in legs. - Consider echocardiogram if symptoms do not improve or worsen.   New onset shortness of breath associated with bilateral leg swelling. No chest pain or fluid in lungs on examination. Previous echocardiogram showed slightly reduced ejection fraction but current symptoms do not strongly suggest heart failure. Shortness of breath could be related to venous insufficiency or increased physical activity. - Monitor symptoms and report if shortness of breath worsens. - Consider echocardiogram if symptoms do not improve or worsen.

## 2023-04-19 ENCOUNTER — Other Ambulatory Visit: Payer: Self-pay

## 2023-04-19 DIAGNOSIS — K219 Gastro-esophageal reflux disease without esophagitis: Secondary | ICD-10-CM

## 2023-04-19 DIAGNOSIS — F331 Major depressive disorder, recurrent, moderate: Secondary | ICD-10-CM

## 2023-04-19 MED ORDER — TRAZODONE HCL 50 MG PO TABS: 50.0000 mg | ORAL_TABLET | Freq: Every day | ORAL | 1 refills | Status: DC

## 2023-04-19 MED ORDER — OMEPRAZOLE 20 MG PO CPDR: 20.0000 mg | DELAYED_RELEASE_CAPSULE | Freq: Every day | ORAL | 1 refills | Status: DC

## 2023-05-02 ENCOUNTER — Ambulatory Visit

## 2023-05-02 VITALS — Ht 65.0 in | Wt 201.0 lb

## 2023-05-02 DIAGNOSIS — Z0000 Encounter for general adult medical examination without abnormal findings: Secondary | ICD-10-CM

## 2023-05-02 DIAGNOSIS — Z1231 Encounter for screening mammogram for malignant neoplasm of breast: Secondary | ICD-10-CM

## 2023-05-02 NOTE — Patient Instructions (Signed)
 Connie Garrett , Thank you for taking time to come for your Medicare Wellness Visit. I appreciate your ongoing commitment to your health goals. Please review the following plan we discussed and let me know if I can assist you in the future.   Referrals/Orders/Follow-Ups/Clinician Recommendations: You have an order for:  []   2D Mammogram  [x]   3D Mammogram  []   Bone Density     Please call for appointment:  [facility] [address] [place], [place] [address] [phone] -[facility] [phone] -MAMMO   Make sure to wear two-piece clothing.  No lotions, powders, or deodorants the day of the appointment. Make sure to bring picture ID and insurance card.  Bring list of medications you are currently taking including any supplements.    This is a list of the screening recommended for you and due dates:  Health Maintenance  Topic Date Due   Complete foot exam   Never done   Eye exam for diabetics  Never done   HIV Screening  Never done   Yearly kidney health urinalysis for diabetes  Never done   Hepatitis C Screening  Never done   Pap with HPV screening  Never done   COVID-19 Vaccine (1 - 2024-25 season) Never done   Mammogram  04/20/2023   Zoster (Shingles) Vaccine (1 of 2) 07/12/2023*   DTaP/Tdap/Td vaccine (1 - Tdap) 01/08/2024*   Flu Shot  08/02/2023   Hemoglobin A1C  09/21/2023   Yearly kidney function blood test for diabetes  03/20/2024   Medicare Annual Wellness Visit  05/01/2024   Colon Cancer Screening  01/03/2025   Pneumococcal Vaccination  Completed   HPV Vaccine  Aged Out   Meningitis B Vaccine  Aged Out  *Topic was postponed. The date shown is not the original due date.    Advanced directives: (ACP Link)Information on Advanced Care Planning can be found at [place]  Secretary of [facility] Advance Health Care Directives Advance Health Care Directives. [email or web address]   Next Medicare Annual Wellness Visit scheduled for next year: Yes

## 2023-05-02 NOTE — Progress Notes (Signed)
 Subjective:   Connie Garrett is a 63 y.o. who presents for a Medicare Wellness preventive visit.  Visit Complete: Virtual I connected with  Noberto Bastos on 05/02/23 by a audio enabled telemedicine application and verified that I am speaking with the correct person using two identifiers.  Patient Location: Home  Provider Location: Home Office  I discussed the limitations of evaluation and management by telemedicine. The patient expressed understanding and agreed to proceed.  Vital Signs: Because this visit was a virtual/telehealth visit, some criteria may be missing or patient reported. Any vitals not documented were not able to be obtained and vitals that have been documented are patient reported.  VideoDeclined- This patient declined Librarian, academic. Therefore the visit was completed with audio only.  Persons Participating in Visit: Patient.  AWV Questionnaire: No: Patient Medicare AWV questionnaire was not completed prior to this visit.  Cardiac Risk Factors include: diabetes mellitus;sedentary lifestyle;dyslipidemia;hypertension     Objective:    Today's Vitals   05/02/23 1336  Weight: 201 lb (91.2 kg)  Height: 5\' 5"  (1.651 m)   Body mass index is 33.45 kg/m.     05/02/2023    1:42 PM 07/09/2022    9:18 AM 07/03/2022   11:02 AM 07/15/2020    3:54 PM 07/18/2018    4:42 PM 07/15/2018   10:26 AM  Advanced Directives  Does Patient Have a Medical Advance Directive? No No No No No No  Would patient like information on creating a medical advance directive? Yes (MAU/Ambulatory/Procedural Areas - Information given) No - Patient declined  Yes (ED - Information included in AVS) No - Patient declined No - Patient declined    Current Medications (verified) Outpatient Encounter Medications as of 05/02/2023  Medication Sig   albuterol  (VENTOLIN  HFA) 108 (90 Base) MCG/ACT inhaler Inhale 2 puffs into the lungs every 4 (four) hours as needed for wheezing  or shortness of breath.   atorvastatin  (LIPITOR) 20 MG tablet Take 1 tablet (20 mg total) by mouth at bedtime.   buPROPion  (WELLBUTRIN  XL) 150 MG 24 hr tablet Take 1 tablet (150 mg total) by mouth daily.   ferrous sulfate  325 (65 FE) MG EC tablet Take 1 tablet (325 mg total) by mouth daily with breakfast.   furosemide  (LASIX ) 20 MG tablet Take 1 tablet (20 mg total) by mouth daily as needed for edema.   losartan  (COZAAR ) 25 MG tablet Take 1 tablet (25 mg total) by mouth daily.   meloxicam  (MOBIC ) 15 MG tablet Take 1 tablet (15 mg total) by mouth daily.   metoprolol  succinate (TOPROL -XL) 25 MG 24 hr tablet Take 1 tablet (25 mg total) by mouth daily.   montelukast  (SINGULAIR ) 10 MG tablet Take 1 tablet (10 mg total) by mouth daily.   Multiple Vitamin (MULTIVITAMIN WITH MINERALS) TABS tablet Take 1 tablet by mouth daily.   omeprazole  (PRILOSEC) 20 MG capsule Take 1 capsule (20 mg total) by mouth daily.   ondansetron  (ZOFRAN ) 4 MG tablet Take 1 tablet (4 mg total) by mouth every 8 (eight) hours as needed for nausea.   ONETOUCH ULTRA test strip daily.   potassium chloride  SA (KLOR-CON  M) 20 MEQ tablet Take 1 tablet (20 mEq total) by mouth daily.   traZODone  (DESYREL ) 50 MG tablet Take 1 tablet (50 mg total) by mouth at bedtime.   venlafaxine  XR (EFFEXOR -XR) 150 MG 24 hr capsule Take 1 capsule (150 mg total) by mouth daily with breakfast. Along with 75 mg XR  for total of 225 mg   venlafaxine  XR (EFFEXOR -XR) 75 MG 24 hr capsule Take 1 capsule (75 mg total) by mouth daily with breakfast. To take along with 150 mg XR for total of 225 mg   No facility-administered encounter medications on file as of 05/02/2023.    Allergies (verified) Anise extract [flavoring agent (non-screening)]   History: Past Medical History:  Diagnosis Date   Anxiety    Arthritis    Cataract    Celiac disease    suspected   Colon polyps    Depression    GERD (gastroesophageal reflux disease)    Hypertension     Osteoporosis    Pre-diabetes    Sleep apnea    Past Surgical History:  Procedure Laterality Date   ABDOMINAL HYSTERECTOMY  1992   BACK SURGERY  1990   colonosocpy     ESOPHAGOGASTRODUODENOSCOPY     EYE SURGERY     TOTAL KNEE ARTHROPLASTY Right 07/18/2018   Procedure: TOTAL KNEE ARTHROPLASTY;  Surgeon: Neil Balls, MD;  Location: [facility];  Service: Orthopedics;  Laterality: Right;   TOTAL KNEE ARTHROPLASTY Left 07/09/2022   Procedure: TOTAL KNEE ARTHROPLASTY;  Surgeon: Neil Balls, MD;  Location: [facility];  Service: Orthopedics;  Laterality: Left;   WRIST FRACTURE SURGERY Left    x 2   Family History  Problem Relation Age of Onset   Diabetes Mother    Colon cancer Neg Hx    Colon polyps Neg Hx    Rectal cancer Neg Hx    Esophageal cancer Neg Hx    Stomach cancer Neg Hx    Social History   Socioeconomic History   Marital status: Single    Spouse name: Not on file   Number of children: 2   Years of education: Not on file   Highest education level: Not on file  Occupational History   Occupation: Homemaker  Tobacco Use   Smoking status: Never   Smokeless tobacco: Never  Vaping Use   Vaping status: Never Used  Substance and Sexual Activity   Alcohol  use: Never   Drug use: Never   Sexual activity: Not Currently  Other Topics Concern   Not on file  Social History Narrative   Not on file   Social Drivers of Health   Financial Resource Strain: Low Risk  (05/02/2023)   Overall Financial Resource Strain (CARDIA)    Difficulty of Paying Living Expenses: Not hard at all  Food Insecurity: No Food Insecurity (05/02/2023)   Hunger Vital Sign    Worried About Running Out of Food in the Last Year: Never true    Ran Out of Food in the Last Year: Never true  Transportation Needs: No Transportation Needs (05/02/2023)   PRAPARE - Administrator, Civil Service (Medical): No    Lack of Transportation (Non-Medical): No  Physical Activity: Inactive (05/02/2023)   Exercise  Vital Sign    Days of Exercise per Week: 0 days    Minutes of Exercise per Session: 0 min  Stress: No Stress Concern Present (05/02/2023)   [employer] of Occupational Health - Occupational Stress Questionnaire    Feeling of Stress : Only a little  Social Connections: Unknown (05/02/2023)   Social Connection and Isolation Panel [NHANES]    Frequency of Communication with Friends and Family: More than three times a week    Frequency of Social Gatherings with Friends and Family: Three times a week    Attends Religious Services: Never  Active Member of Clubs or Organizations: No    Attends Banker Meetings: Never    Marital Status: Patient declined    Tobacco Counseling Counseling given: Not Answered    Clinical Intake:  Pre-visit preparation completed: Yes  Pain : No/denies pain     Diabetes: Yes CBG done?: No Did pt. bring in CBG monitor from home?: No  Lab Results  Component Value Date   HGBA1C 5.9 (H) 03/21/2023     How often do you need to have someone help you when you read instructions, pamphlets, or other written materials from your doctor or pharmacy?: 1 - Never  Interpreter Needed?: No  Information entered by :: Seabron Cypress LPN   Activities of Daily Living     05/02/2023    1:41 PM 07/09/2022   10:00 PM  In your present state of health, do you have any difficulty performing the following activities:  Hearing? 0 0  Vision? 0 0  Difficulty concentrating or making decisions? 0 0  Walking or climbing stairs? 0 0  Dressing or bathing? 0 0  Doing errands, shopping? 0 0  Preparing Food and eating ? N   Using the Toilet? N   In the past six months, have you accidently leaked urine? N   Do you have problems with loss of bowel control? N   Managing your Medications? N   Managing your Finances? N   Housekeeping or managing your Housekeeping? N     Patient Care Team: Sirivol, Mamatha, MD as PCP - General (Family Medicine) Vertie Gosling, Ray A, OD  (Optometry)  Indicate any recent Medical Services you may have received from other than [facility] providers in the past year (date may be approximate).     Assessment:   This is a routine wellness examination for Rozay.  Hearing/Vision screen Hearing Screening - Comments:: Denies hearing difficulties   Vision Screening - Comments:: Wears rx glasses - up to date with routine eye exams with Dr. Arthuro Billow    Goals Addressed             This Visit's Progress    Increase physical activity         Depression Screen     05/02/2023    1:39 PM 04/12/2023   10:30 AM 03/21/2023    8:44 AM 01/08/2023   10:30 AM 07/15/2020   12:23 PM  PHQ 2/9 Scores  PHQ - 2 Score 0 0 6 4 4   PHQ- 9 Score 9  15 15 12     Fall Risk     05/02/2023    1:40 PM 04/12/2023   10:30 AM 03/21/2023    8:44 AM 12/05/2020   12:34 PM 09/28/2020    1:26 PM  Fall Risk   Falls in the past year? 0 0 0 1 1  Number falls in past yr: 0 0 0 0 0  Injury with Fall? 0 0 0 1 1  Risk for fall due to : No Fall Risks No Fall Risks No Fall Risks Impaired mobility;Impaired balance/gait;History of fall(s) Impaired mobility;Impaired balance/gait;History of fall(s);Impaired vision  Follow up Falls prevention discussed;Education provided;Falls evaluation completed   Falls prevention discussed;Education provided;Falls evaluation completed Falls prevention discussed;Education provided;Falls evaluation completed    MEDICARE RISK AT HOME:  Medicare Risk at Home Any stairs in or around the home?: No If so, are there any without handrails?: No Home free of loose throw rugs in walkways, pet beds, electrical cords, etc?: Yes Adequate lighting in your  home to reduce risk of falls?: Yes Life alert?: No Use of a cane, walker or w/c?: No Grab bars in the bathroom?: Yes Shower chair or bench in shower?: No Elevated toilet seat or a handicapped toilet?: Yes  TIMED UP AND GO:  Was the test performed?  No  Cognitive Function: 6CIT completed         05/02/2023    1:42 PM  6CIT Screen  What Year? 0 points  What month? 0 points  What time? 0 points  Count back from 20 0 points  Months in reverse 0 points  Repeat phrase 0 points  Total Score 0 points    Immunizations Immunization History  Administered Date(s) Administered   Influenza, Quadrivalent, Recombinant, Inj, Pf 10/22/2019   PNEUMOCOCCAL CONJUGATE-20 05/25/2021   Pneumococcal Conjugate-13 01/12/2021    Screening Tests Health Maintenance  Topic Date Due   FOOT EXAM  Never done   OPHTHALMOLOGY EXAM  Never done   HIV Screening  Never done   Diabetic kidney evaluation - Urine ACR  Never done   Hepatitis C Screening  Never done   Cervical Cancer Screening (HPV/Pap Cotest)  Never done   COVID-19 Vaccine (1 - 2024-25 season) Never done   MAMMOGRAM  04/20/2023   Zoster Vaccines- Shingrix (1 of 2) 07/12/2023 (Originally 08/30/2012)   DTaP/Tdap/Td (1 - Tdap) 01/08/2024 (Originally 08/30/1981)   INFLUENZA VACCINE  08/02/2023   HEMOGLOBIN A1C  09/21/2023   Diabetic kidney evaluation - eGFR measurement  03/20/2024   Medicare Annual Wellness (AWV)  05/01/2024   Colonoscopy  01/03/2025   Pneumococcal Vaccine 60-68 Years old  Completed   HPV VACCINES  Aged Out   Meningococcal B Vaccine  Aged Out    Health Maintenance  Health Maintenance Due  Topic Date Due   FOOT EXAM  Never done   OPHTHALMOLOGY EXAM  Never done   HIV Screening  Never done   Diabetic kidney evaluation - Urine ACR  Never done   Hepatitis C Screening  Never done   Cervical Cancer Screening (HPV/Pap Cotest)  Never done   COVID-19 Vaccine (1 - 2024-25 season) Never done   MAMMOGRAM  04/20/2023   Health Maintenance Items Addressed: Mammogram ordered, requested diabetic eye exam notes   Additional Screening:  Vision Screening: Recommended annual ophthalmology exams for early detection of glaucoma and other disorders of the eye.  Dental Screening: Recommended annual dental exams for proper oral  hygiene  Community Resource Referral / Chronic Care Management: CRR required this visit?  No   CCM required this visit?  No     Plan:     I have personally reviewed and noted the following in the patient's chart:   Medical and social history Use of alcohol , tobacco or illicit drugs  Current medications and supplements including opioid prescriptions. Patient is not currently taking opioid prescriptions. Functional ability and status Nutritional status Physical activity Advanced directives List of other physicians Hospitalizations, surgeries, and ER visits in previous 12 months Vitals Screenings to include cognitive, depression, and falls Referrals and appointments  In addition, I have reviewed and discussed with patient certain preventive protocols, quality metrics, and best practice recommendations. A written personalized care plan for preventive services as well as general preventive health recommendations were provided to patient.     Seabron Cypress [place], [place]   04/04/100   After Visit Summary: (MyChart) Due to this being a telephonic visit, the after visit summary with patients personalized plan was offered to patient via MyChart  Notes: Please refer to Routing Comments.

## 2023-05-03 ENCOUNTER — Other Ambulatory Visit: Payer: Self-pay

## 2023-05-03 DIAGNOSIS — K5909 Other constipation: Secondary | ICD-10-CM

## 2023-05-03 DIAGNOSIS — K900 Celiac disease: Secondary | ICD-10-CM

## 2023-05-03 MED ORDER — LANCET DEVICE MISC: 1.0000 | Freq: Three times a day (TID) | 0 refills | Status: AC

## 2023-05-03 MED ORDER — BLOOD GLUCOSE TEST VI STRP: 1.0000 | ORAL_STRIP | Freq: Three times a day (TID) | 0 refills | Status: AC

## 2023-05-03 MED ORDER — LANCETS MISC. MISC: 1.0000 | Freq: Three times a day (TID) | 0 refills | Status: AC

## 2023-05-03 MED ORDER — BLOOD GLUCOSE MONITORING SUPPL DEVI: 1.0000 | Freq: Three times a day (TID) | 0 refills | Status: AC

## 2023-05-09 ENCOUNTER — Telehealth: Payer: Self-pay

## 2023-05-09 NOTE — Progress Notes (Signed)
 Brigitte Canard, PA-C [address] [place], [place]  [address] Phone: [phone]   Primary Care Physician: Sirivol, Mamatha, MD  Primary Gastroenterologist:  Brigitte Canard, PA-C / Alvester Johnson, MD   Chief Complaint: Constipation, celiac disease       HPI:   Connie Garrett is a 63 y.o. female returns for follow-up.  She has history of chronic constipation, celiac disease, GERD, and history of multiple adenomatous colon polyps.  Previously treated with MiraLAX , Amitiza, Linzess , and Trulance.  History of iron deficiency anemia and is taking oral iron.  She has had difficulty with constipation alternating with loose stools.  Took Ozempic which caused GI side effects.  History of GERD and dysphagia controlled on omeprazole  20 Mg daily.  EGD showed no evidence of esophageal stricture.  Underwent empiric dilation.  Current symptoms: Patient is not currently taking any treatment for constipation.  She has a bowel movement every 2 or 3 weeks with hard stools and straining.  She is willing to try Linzess  again.  She is trying to avoid gluten in her diet.  We discussed nutrition referral and she agrees.  Acid reflux is controlled on omeprazole  20 Mg once daily.  No other GI concerns today.  01/2022 EGD by Dr. General Kenner: 1 cm hiatal hernia, normal stomach and duodenum.  Mild flattening in the duodenal bulb with a few areas of mild scalloped mucosa in the second portion of duodenum.  Biopsies were consistent with celiac.  01/2022 colonoscopy by Dr. General Kenner: 3 small (3 mm to 4 mm) tubular adenoma polyps removed.  Sigmoid diverticulosis.  Small internal hemorrhoids.  Adequate prep.  3-year repeat.  11/2020 EGD: showed fairly typical changes for celiac disease and pathology was consistent with Celiac. Had positive DQ 2 genetic test, but DQ 8 was negative.  Other serologic testing was negative as well.  11/2020 Colonoscopy by DR. Armbruster:  12 polyps, 11 of them were adenomas, she was  told to repeat a colonoscopy in 1 year for surveillance.  She denies any family history of colon cancer.   Current Outpatient Medications  Medication Sig Dispense Refill   albuterol  (VENTOLIN  HFA) 108 (90 Base) MCG/ACT inhaler Inhale 2 puffs into the lungs every 4 (four) hours as needed for wheezing or shortness of breath. 1 each 1   atorvastatin  (LIPITOR) 20 MG tablet Take 1 tablet (20 mg total) by mouth at bedtime. 90 tablet 1   Blood Glucose Monitoring Suppl DEVI 1 each by Does not apply route in the morning, at noon, and at bedtime. May substitute to any manufacturer covered by patient's insurance. 1 each 0   buPROPion  (WELLBUTRIN  XL) 150 MG 24 hr tablet Take 1 tablet (150 mg total) by mouth daily. 90 tablet 1   ferrous sulfate  325 (65 FE) MG EC tablet Take 1 tablet (325 mg total) by mouth daily with breakfast. 90 tablet 1   furosemide  (LASIX ) 20 MG tablet Take 1 tablet (20 mg total) by mouth daily as needed for edema. 30 tablet 0   Glucose Blood (BLOOD GLUCOSE TEST STRIPS) STRP 1 each by In Vitro route in the morning, at noon, and at bedtime. May substitute to any manufacturer covered by patient's insurance. 100 strip 0   Lancet Device MISC 1 each by Does not apply route in the morning, at noon, and at bedtime. May substitute to any manufacturer covered by patient's insurance. 1 each 0   Lancets Misc. MISC 1 each by Does not apply route  in the morning, at noon, and at bedtime. May substitute to any manufacturer covered by patient's insurance. 100 each 0   losartan  (COZAAR ) 25 MG tablet Take 1 tablet (25 mg total) by mouth daily. 90 tablet 1   meloxicam  (MOBIC ) 15 MG tablet Take 1 tablet (15 mg total) by mouth daily. 90 tablet 1   metoprolol  succinate (TOPROL -XL) 25 MG 24 hr tablet Take 1 tablet (25 mg total) by mouth daily. 90 tablet 1   montelukast  (SINGULAIR ) 10 MG tablet Take 1 tablet (10 mg total) by mouth daily. 30 tablet 2   Multiple Vitamin (MULTIVITAMIN WITH MINERALS) TABS tablet Take 1  tablet by mouth daily.     omeprazole  (PRILOSEC) 20 MG capsule Take 1 capsule (20 mg total) by mouth daily. 90 capsule 1   ondansetron  (ZOFRAN ) 4 MG tablet Take 1 tablet (4 mg total) by mouth every 8 (eight) hours as needed for nausea. 15 tablet 0   ONETOUCH ULTRA test strip daily.     potassium chloride  SA (KLOR-CON  M) 20 MEQ tablet Take 1 tablet (20 mEq total) by mouth daily. 90 tablet 1   traZODone  (DESYREL ) 50 MG tablet Take 1 tablet (50 mg total) by mouth at bedtime. 90 tablet 1   venlafaxine  XR (EFFEXOR -XR) 150 MG 24 hr capsule Take 1 capsule (150 mg total) by mouth daily with breakfast. Along with 75 mg XR for total of 225 mg 90 capsule 1   venlafaxine  XR (EFFEXOR -XR) 75 MG 24 hr capsule Take 1 capsule (75 mg total) by mouth daily with breakfast. To take along with 150 mg XR for total of 225 mg 90 capsule 1   No current facility-administered medications for this visit.    Allergies as of 05/10/2023 - Review Complete 05/10/2023  Allergen Reaction Noted   Anise extract [flavoring agent (non-screening)] Hives and Swelling 01/03/2022    Past Medical History:  Diagnosis Date   Anxiety    Arthritis    Cataract    Celiac disease    suspected   Colon polyps    Depression    GERD (gastroesophageal reflux disease)    Hypertension    Osteoporosis    Pre-diabetes    Sleep apnea     Past Surgical History:  Procedure Laterality Date   ABDOMINAL HYSTERECTOMY  1992   BACK SURGERY  1990   colonosocpy     ESOPHAGOGASTRODUODENOSCOPY     EYE SURGERY     TOTAL KNEE ARTHROPLASTY Right 07/18/2018   Procedure: TOTAL KNEE ARTHROPLASTY;  Surgeon: Neil Balls, MD;  Location: [facility];  Service: Orthopedics;  Laterality: Right;   TOTAL KNEE ARTHROPLASTY Left 07/09/2022   Procedure: TOTAL KNEE ARTHROPLASTY;  Surgeon: Neil Balls, MD;  Location: [facility];  Service: Orthopedics;  Laterality: Left;   WRIST FRACTURE SURGERY Left    x 2    Review of Systems:    All systems reviewed and negative  except where noted in HPI.    Physical Exam:  BP 120/70   Pulse 71   Ht 5\' 5"  (1.651 m)   Wt 200 lb (90.7 kg)   SpO2 97%   BMI 33.28 kg/m  No LMP recorded. Patient has had a hysterectomy.  General: Well-nourished, well-developed in no acute distress.  Lungs: Clear to auscultation bilaterally. Non-labored. Heart: Regular rate and rhythm, no murmurs rubs or gallops.  Abdomen: Bowel sounds are normal; Abdomen is Soft; No hepatosplenomegaly, masses or hernias;  No Abdominal Tenderness; No guarding or rebound tenderness. Neuro: Alert and  oriented x 3.  Grossly intact.  Psych: Alert and cooperative, normal mood and affect.   Imaging Studies: No results found.  Labs: CBC    Component Value Date/Time   WBC 7.0 03/21/2023 1013   WBC 8.4 07/03/2022 1121   RBC 4.00 03/21/2023 1013   RBC 4.34 07/03/2022 1121   HGB 11.6 03/21/2023 1013   HCT 35.5 03/21/2023 1013   PLT 227 03/21/2023 1013   MCV 89 03/21/2023 1013   MCH 29.0 03/21/2023 1013   MCH 29.3 07/03/2022 1121   MCHC 32.7 03/21/2023 1013   MCHC 32.8 07/03/2022 1121   RDW 13.1 03/21/2023 1013   LYMPHSABS 2.1 03/21/2023 1013   MONOABS 0.8 09/28/2020 1132   EOSABS 0.3 03/21/2023 1013   BASOSABS 0.0 03/21/2023 1013    CMP     Component Value Date/Time   NA 140 03/21/2023 1013   K 3.6 03/21/2023 1013   CL 98 03/21/2023 1013   CO2 25 03/21/2023 1013   GLUCOSE 111 (H) 03/21/2023 1013   GLUCOSE 100 (H) 07/03/2022 1121   BUN 15 03/21/2023 1013   CREATININE 0.91 03/21/2023 1013   CALCIUM  8.8 03/21/2023 1013   PROT 6.1 03/21/2023 1013   ALBUMIN 4.0 03/21/2023 1013   AST 14 03/21/2023 1013   ALT 12 03/21/2023 1013   ALKPHOS 103 03/21/2023 1013   BILITOT 0.5 03/21/2023 1013   GFRNONAA >60 07/03/2022 1121   GFRAA >60 07/15/2018 1047       Assessment and Plan:   DAMIAN BALDASSARRE is a 63 y.o. y/o female returns for follow-up of:  1.  Celiac disease -Follow-up celiac labs:  TTG, DGP(deamidated gliadin peptide),  vitamins (A, D, E, B12), folic acid, ferritin, and iron.  -I encouraged her to talk with her PCP to schedule a Bone Density test to screen for osteoporosis. -Strict Gluten Free Diet -Refer to Nutritionist to help with GFD.  2.  Chronic constipation -  -Gave samples of Linzess  145 mcg QD for 1 week, then 290 mcg QD for 1 week.  Patient will let me know which dose works best, and then we can send a prescription.   -High Fiber Diet, 64 ounces fluids daily.  3.  GERD - Improved. - Continue Omeprazole  20mg  daily. -Recommend Lifestyle Modifications to prevent Acid Reflux.  Rec. Avoid coffee, sodas, peppermint, garlic, onions, alcohol , citrus fruits, chocolate, tomatoes, fatty and spicey foods.  Avoid eating 2-3 hours before bedtime.    4.  History of adenomatous colon polyps - 3-year repeat colonoscopy will be due 01/2025   Brigitte Canard, PA-C  Follow up 6 months with TG f/u Celiac and Constipation.

## 2023-05-09 NOTE — Telephone Encounter (Signed)
 Copied from CRM [id number]. Topic: Appointments - Scheduling Inquiry for Clinic >> May 09, 2023  8:41 AM [name] wrote: Reason for CRM: Patient would like to mammogram appointment. Please contact patient at [phone]

## 2023-05-09 NOTE — Telephone Encounter (Signed)
 Left message for Pt to return call to provide contact information to [facility] [phone] to schedule mammogram. Centralized scheduling attempted x 2 to schedule Pt.

## 2023-05-10 ENCOUNTER — Encounter: Payer: Self-pay | Admitting: Physician Assistant

## 2023-05-10 ENCOUNTER — Other Ambulatory Visit (INDEPENDENT_AMBULATORY_CARE_PROVIDER_SITE_OTHER)

## 2023-05-10 ENCOUNTER — Ambulatory Visit (INDEPENDENT_AMBULATORY_CARE_PROVIDER_SITE_OTHER): Admitting: Physician Assistant

## 2023-05-10 VITALS — BP 120/70 | HR 71 | Ht 65.0 in | Wt 200.0 lb

## 2023-05-10 DIAGNOSIS — K5909 Other constipation: Secondary | ICD-10-CM

## 2023-05-10 DIAGNOSIS — Z860101 Personal history of adenomatous and serrated colon polyps: Secondary | ICD-10-CM | POA: Diagnosis not present

## 2023-05-10 DIAGNOSIS — K219 Gastro-esophageal reflux disease without esophagitis: Secondary | ICD-10-CM | POA: Diagnosis not present

## 2023-05-10 DIAGNOSIS — D509 Iron deficiency anemia, unspecified: Secondary | ICD-10-CM

## 2023-05-10 DIAGNOSIS — K900 Celiac disease: Secondary | ICD-10-CM | POA: Diagnosis not present

## 2023-05-10 DIAGNOSIS — Z789 Other specified health status: Secondary | ICD-10-CM

## 2023-05-10 DIAGNOSIS — Z860100 Personal history of colon polyps, unspecified: Secondary | ICD-10-CM

## 2023-05-10 LAB — CBC WITH DIFFERENTIAL/PLATELET
Basophils Absolute: 0.1 10*3/uL (ref 0.0–0.1)
Basophils Relative: 0.6 % (ref 0.0–3.0)
Eosinophils Absolute: 0.3 10*3/uL (ref 0.0–0.7)
Eosinophils Relative: 3.4 % (ref 0.0–5.0)
HCT: 36.3 % (ref 36.0–46.0)
Hemoglobin: 12.2 g/dL (ref 12.0–15.0)
Lymphocytes Relative: 31.8 % (ref 12.0–46.0)
Lymphs Abs: 2.8 10*3/uL (ref 0.7–4.0)
MCHC: 33.6 g/dL (ref 30.0–36.0)
MCV: 87 fl (ref 78.0–100.0)
Monocytes Absolute: 0.9 10*3/uL (ref 0.1–1.0)
Monocytes Relative: 10 % (ref 3.0–12.0)
Neutro Abs: 4.8 10*3/uL (ref 1.4–7.7)
Neutrophils Relative %: 54.2 % (ref 43.0–77.0)
Platelets: 257 10*3/uL (ref 150.0–400.0)
RBC: 4.18 Mil/uL (ref 3.87–5.11)
RDW: 13.9 % (ref 11.5–15.5)
WBC: 8.8 10*3/uL (ref 4.0–10.5)

## 2023-05-10 LAB — VITAMIN D 25 HYDROXY (VIT D DEFICIENCY, FRACTURES): VITD: 65.98 ng/mL (ref 30.00–100.00)

## 2023-05-10 LAB — VITAMIN B12: Vitamin B-12: 471 pg/mL (ref 211–911)

## 2023-05-10 NOTE — Patient Instructions (Addendum)
 We have given you samples of the following medication to take: Linzess  145 mcg and Linzess  290 mcg  Your provider has requested that you go to the basement level for lab work before leaving today. Press "B" on the elevator. The lab is located at the first door on the left as you exit the elevator.  We have referred you to a Nutritionist they will be giving you a call to schedule your appointment  Please follow up sooner if symptoms increase or worsen  Due to recent changes in healthcare laws, you may see the results of your imaging and laboratory studies on MyChart before your provider has had a chance to review them.  We understand that in some cases there may be results that are confusing or concerning to you. Not all laboratory results come back in the same time frame and the provider may be waiting for multiple results in order to interpret others.  Please give us  48 hours in order for your provider to thoroughly review all the results before contacting the office for clarification of your results.   _______________________________________________________  If your blood pressure at your visit was 140/90 or greater, please contact your primary care physician to follow up on this.  _______________________________________________________  If you are age 107 or older, your body mass index should be between 23-30. Your Body mass index is 33.28 kg/m. If this is out of the aforementioned range listed, please consider follow up with your Primary Care Provider.  If you are age 69 or younger, your body mass index should be between 19-25. Your Body mass index is 33.28 kg/m. If this is out of the aformentioned range listed, please consider follow up with your Primary Care Provider.   ________________________________________________________  The [facility] providers would like to encourage you to use MYCHART to communicate with providers for non-urgent requests or questions.  Due to long hold times on  the telephone, sending your provider a message by [facility] may be a faster and more efficient way to get a response.  Please allow 48 business hours for a response.  Please remember that this is for non-urgent requests.  _______________________________________________________ Thank you for trusting me with your gastrointestinal care!   Brigitte Canard, PA

## 2023-05-10 NOTE — Progress Notes (Signed)
 Agree with assessment and plan as outlined.

## 2023-05-13 LAB — CELIAC DISEASE AB SCREEN W/RFX
Antigliadin Abs, IgA: 31 U — ABNORMAL HIGH (ref 0–19)
IgA/Immunoglobulin A, Serum: 219 mg/dL (ref 87–352)

## 2023-05-15 ENCOUNTER — Inpatient Hospital Stay (HOSPITAL_BASED_OUTPATIENT_CLINIC_OR_DEPARTMENT_OTHER): Admission: RE | Admit: 2023-05-15 | Source: Ambulatory Visit | Admitting: Radiology

## 2023-05-16 ENCOUNTER — Other Ambulatory Visit: Payer: Self-pay

## 2023-05-16 ENCOUNTER — Ambulatory Visit: Payer: Self-pay | Admitting: Physician Assistant

## 2023-05-16 LAB — IRON,TIBC AND FERRITIN PANEL
%SAT: 18 % (ref 16–45)
Ferritin: 101 ng/mL (ref 16–232)
Iron: 48 ug/dL (ref 45–160)
TIBC: 267 ug/dL (ref 250–450)

## 2023-05-16 LAB — VITAMIN E
Gamma-Tocopherol (Vit E): <1 mg/L (ref <4.4)
Vitamin E (Alpha Tocopherol): 11.3 mg/L (ref 5.7–19.9)

## 2023-05-16 LAB — VITAMIN A: Vitamin A (Retinoic Acid): 48 ug/dL (ref 38–98)

## 2023-05-16 LAB — VITAMIN K1, SERUM: Vitamin K: 297 pg/mL (ref 130–1500)

## 2023-05-16 MED ORDER — FUROSEMIDE 20 MG PO TABS
20.0000 mg | ORAL_TABLET | Freq: Every day | ORAL | 0 refills | Status: DC | PRN
Start: 2023-05-16 — End: 2023-06-10

## 2023-05-20 ENCOUNTER — Other Ambulatory Visit: Payer: Self-pay

## 2023-05-20 DIAGNOSIS — M810 Age-related osteoporosis without current pathological fracture: Secondary | ICD-10-CM

## 2023-05-20 DIAGNOSIS — K900 Celiac disease: Secondary | ICD-10-CM

## 2023-05-20 NOTE — Telephone Encounter (Signed)
 Copied from CRM [id number]. Topic: Clinical - Request for Lab/Test Order >> May 20, 2023  9:34 AM [facility] wrote: Reason for CRM: Patient wants a bone density test done. Callback number is  [phone]

## 2023-05-21 ENCOUNTER — Ambulatory Visit (HOSPITAL_BASED_OUTPATIENT_CLINIC_OR_DEPARTMENT_OTHER)
Admission: RE | Admit: 2023-05-21 | Discharge: 2023-05-21 | Disposition: A | Discharge status: Home / Self Care | Source: Ambulatory Visit

## 2023-05-21 DIAGNOSIS — Z1231 Encounter for screening mammogram for malignant neoplasm of breast: Secondary | ICD-10-CM | POA: Diagnosis not present

## 2023-05-28 ENCOUNTER — Telehealth: Payer: Self-pay | Admitting: Physician Assistant

## 2023-05-28 MED ORDER — LINACLOTIDE 290 MCG PO CAPS: 290.0000 ug | ORAL_CAPSULE | Freq: Every day | ORAL | 1 refills | Status: DC

## 2023-05-28 NOTE — Telephone Encounter (Signed)
 Called and spoke to patient. She indicates the  Linzess  290 mcgs work best for her.  Requested a 90 day supply sent to [facility]. Script sent

## 2023-05-28 NOTE — Telephone Encounter (Signed)
 Patient called and stated that she was seen by Brigitte Canard on May the 09 th and and was given sample of Linzess  290. Patient stated that she was out of here sample and is requesting a call back to let her know it was sent over to her pharmacy. Please advise.

## 2023-05-31 ENCOUNTER — Encounter (HOSPITAL_BASED_OUTPATIENT_CLINIC_OR_DEPARTMENT_OTHER): Payer: Self-pay

## 2023-06-07 ENCOUNTER — Other Ambulatory Visit (HOSPITAL_BASED_OUTPATIENT_CLINIC_OR_DEPARTMENT_OTHER): Admitting: Radiology

## 2023-06-10 ENCOUNTER — Other Ambulatory Visit: Payer: Self-pay

## 2023-06-12 ENCOUNTER — Ambulatory Visit (INDEPENDENT_AMBULATORY_CARE_PROVIDER_SITE_OTHER)
Admission: RE | Admit: 2023-06-12 | Discharge: 2023-06-12 | Disposition: A | Discharge status: Home / Self Care | Source: Ambulatory Visit

## 2023-06-12 DIAGNOSIS — M810 Age-related osteoporosis without current pathological fracture: Secondary | ICD-10-CM | POA: Diagnosis not present

## 2023-06-12 DIAGNOSIS — K900 Celiac disease: Secondary | ICD-10-CM

## 2023-06-13 ENCOUNTER — Ambulatory Visit: Payer: Self-pay

## 2023-06-21 DIAGNOSIS — L603 Nail dystrophy: Secondary | ICD-10-CM | POA: Diagnosis not present

## 2023-06-21 DIAGNOSIS — B351 Tinea unguium: Secondary | ICD-10-CM | POA: Diagnosis not present

## 2023-06-24 ENCOUNTER — Ambulatory Visit (INDEPENDENT_AMBULATORY_CARE_PROVIDER_SITE_OTHER)

## 2023-06-24 VITALS — BP 114/70 | HR 73 | Temp 97.0°F | Ht 65.0 in | Wt 199.0 lb

## 2023-06-24 DIAGNOSIS — R0602 Shortness of breath: Secondary | ICD-10-CM | POA: Insufficient documentation

## 2023-06-24 DIAGNOSIS — I10 Essential (primary) hypertension: Secondary | ICD-10-CM

## 2023-06-24 DIAGNOSIS — M810 Age-related osteoporosis without current pathological fracture: Secondary | ICD-10-CM | POA: Insufficient documentation

## 2023-06-24 DIAGNOSIS — R7303 Prediabetes: Secondary | ICD-10-CM | POA: Diagnosis not present

## 2023-06-24 DIAGNOSIS — M25551 Pain in right hip: Secondary | ICD-10-CM | POA: Diagnosis not present

## 2023-06-24 DIAGNOSIS — E785 Hyperlipidemia, unspecified: Secondary | ICD-10-CM | POA: Diagnosis not present

## 2023-06-24 DIAGNOSIS — F331 Major depressive disorder, recurrent, moderate: Secondary | ICD-10-CM

## 2023-06-24 MED ORDER — ALENDRONATE SODIUM 70 MG PO TABS: 70.0000 mg | ORAL_TABLET | ORAL | 11 refills | Status: AC

## 2023-06-24 NOTE — Progress Notes (Signed)
 Subjective:  Patient ID: Connie Garrett, female    DOB: 17-Aug-1960  Age: 63 y.o. MRN: [id number]  Chief Complaint  Patient presents with   Medical Management of Chronic Issues    HPI: Discussed the use of AI scribe software for clinical note transcription with the patient, who gave verbal consent to proceed.  History of Present Illness   Connie Garrett is a 63 year old female with a history of leg swelling and shortness of breath who presents for follow-up.  She continues to experience bilateral leg swelling and shortness of breath. Furosemide  (Lasix ) 30 tablets, taken one tablet daily, has alleviated the swelling. She monitors her sodium intake. Shortness of breath persists, especially during ambulation, such as walking to the mailbox and back on flat surfaces. No chest pain is reported. An echocardiogram in 2022 showed an ejection fraction of 50%.  She has a history of constipation and celiac disease. Her last colonoscopy in November 2022 revealed 12 polyps, 11 of which were adenomas. A repeat colonoscopy is scheduled for January 2027. Despite taking Linzess , she experiences constipation with watery stools shortly after ingestion. She adheres to a gluten-free diet, and recent blood work showed elevated anti-gliadin antibodies. Her iron, vitamin B12, and other vitamin levels are normal.  She experiences right lateral hip pain and has tried heat for relief but has not used any topical treatments like diclofenac gel.  Recent blood work from March 2025 showed normal electrolytes, improved kidney function, and well-controlled cholesterol. Her A1c was 5.9, indicating well-controlled blood sugar levels. She was previously unaware of a diabetes diagnosis.         06/24/2023    8:35 AM 05/02/2023    1:39 PM 04/12/2023   10:30 AM 03/21/2023    8:44 AM 01/08/2023   10:30 AM  Depression screen PHQ 2/9  Decreased Interest 3 0 0 3 3  Down, Depressed, Hopeless 3 0 0 3 1  PHQ - 2 Score 6 0 0 6 4   Altered sleeping 3 3  3 2   Tired, decreased energy 3 3  3 3   Change in appetite 2 3  3 3   Feeling bad or failure about yourself  0 0  0 0  Trouble concentrating 0 0  0 0  Moving slowly or fidgety/restless 0 0  0 3  Suicidal thoughts 0 0  0 0  PHQ-9 Score 14 9  15 15   Difficult doing work/chores Somewhat difficult   Somewhat difficult Not difficult at all        06/24/2023    8:35 AM  Fall Risk   Falls in the past year? 0  Number falls in past yr: 0  Injury with Fall? 0  Risk for fall due to : No Fall Risks    Patient Care Team: Jace Dowe, MD as PCP - General (Family Medicine) Maryjo, Ray A, OD (Optometry)   Review of Systems  Constitutional:  Negative for chills, fatigue and fever.  HENT:  Negative for congestion, ear pain, sinus pressure and sore throat.   Respiratory:  Positive for shortness of breath. Negative for cough.   Cardiovascular:  Negative for chest pain.  Gastrointestinal:  Negative for abdominal pain, constipation, diarrhea, nausea and vomiting.  Genitourinary:  Negative for dysuria and frequency.  Musculoskeletal:  Negative for arthralgias, back pain and myalgias.       Right lateral hip pain  Neurological:  Negative for dizziness and headaches.  Psychiatric/Behavioral:  Negative for dysphoric mood. The patient  is not nervous/anxious.     Current Outpatient Medications on File Prior to Visit  Medication Sig Dispense Refill   albuterol  (VENTOLIN  HFA) 108 (90 Base) MCG/ACT inhaler Inhale 2 puffs into the lungs every 4 (four) hours as needed for wheezing or shortness of breath. 1 each 1   atorvastatin  (LIPITOR) 20 MG tablet Take 1 tablet (20 mg total) by mouth at bedtime. 90 tablet 1   Blood Glucose Monitoring Suppl DEVI 1 each by Does not apply route in the morning, at noon, and at bedtime. May substitute to any manufacturer covered by patient's insurance. 1 each 0   buPROPion  (WELLBUTRIN  XL) 150 MG 24 hr tablet Take 1 tablet (150 mg total) by mouth  daily. 90 tablet 1   ferrous sulfate  325 (65 FE) MG EC tablet Take 1 tablet (325 mg total) by mouth daily with breakfast. 90 tablet 1   furosemide  (LASIX ) 20 MG tablet Take 1 tablet (20 mg total) by mouth daily as needed for edema. 30 tablet 0   linaclotide  (LINZESS ) 290 MCG CAPS capsule Take 1 capsule (290 mcg total) by mouth daily before breakfast. Take 30 minutes before a meal 90 capsule 1   losartan  (COZAAR ) 25 MG tablet Take 1 tablet (25 mg total) by mouth daily. 90 tablet 1   meloxicam  (MOBIC ) 15 MG tablet Take 1 tablet (15 mg total) by mouth daily. 90 tablet 1   metoprolol  succinate (TOPROL -XL) 25 MG 24 hr tablet Take 1 tablet (25 mg total) by mouth daily. 90 tablet 1   montelukast  (SINGULAIR ) 10 MG tablet Take 1 tablet (10 mg total) by mouth daily. 30 tablet 2   Multiple Vitamin (MULTIVITAMIN WITH MINERALS) TABS tablet Take 1 tablet by mouth daily.     omeprazole  (PRILOSEC) 20 MG capsule Take 1 capsule (20 mg total) by mouth daily. 90 capsule 1   ondansetron  (ZOFRAN ) 4 MG tablet Take 1 tablet (4 mg total) by mouth every 8 (eight) hours as needed for nausea. 15 tablet 0   ONETOUCH ULTRA test strip daily.     potassium chloride  SA (KLOR-CON  M) 20 MEQ tablet Take 1 tablet (20 mEq total) by mouth daily. 90 tablet 1   traZODone  (DESYREL ) 50 MG tablet Take 1 tablet (50 mg total) by mouth at bedtime. 90 tablet 1   venlafaxine  XR (EFFEXOR -XR) 150 MG 24 hr capsule Take 1 capsule (150 mg total) by mouth daily with breakfast. Along with 75 mg XR for total of 225 mg 90 capsule 1   venlafaxine  XR (EFFEXOR -XR) 75 MG 24 hr capsule Take 1 capsule (75 mg total) by mouth daily with breakfast. To take along with 150 mg XR for total of 225 mg 90 capsule 1   No current facility-administered medications on file prior to visit.   Past Medical History:  Diagnosis Date   Anxiety    Arthritis    Cataract    Celiac disease    suspected   Colon polyps    Depression    GERD (gastroesophageal reflux disease)     Hypertension    Osteoporosis    Pre-diabetes    Sleep apnea    Past Surgical History:  Procedure Laterality Date   ABDOMINAL HYSTERECTOMY  1992   BACK SURGERY  1990   colonosocpy     ESOPHAGOGASTRODUODENOSCOPY     EYE SURGERY     TOTAL KNEE ARTHROPLASTY Right 07/18/2018   Procedure: TOTAL KNEE ARTHROPLASTY;  Surgeon: Yvone Rush, MD;  Location: [facility];  Service: Orthopedics;  Laterality: Right;   TOTAL KNEE ARTHROPLASTY Left 07/09/2022   Procedure: TOTAL KNEE ARTHROPLASTY;  Surgeon: Yvone Rush, MD;  Location: [facility];  Service: Orthopedics;  Laterality: Left;   WRIST FRACTURE SURGERY Left    x 2    Family History  Problem Relation Age of Onset   Diabetes Mother    Colon cancer Neg Hx    Colon polyps Neg Hx    Rectal cancer Neg Hx    Esophageal cancer Neg Hx    Stomach cancer Neg Hx    Social History   Socioeconomic History   Marital status: Single    Spouse name: Not on file   Number of children: 2   Years of education: Not on file   Highest education level: Not on file  Occupational History   Occupation: Homemaker  Tobacco Use   Smoking status: Never   Smokeless tobacco: Never  Vaping Use   Vaping status: Never Used  Substance and Sexual Activity   Alcohol  use: Never   Drug use: Never   Sexual activity: Not Currently  Other Topics Concern   Not on file  Social History Narrative   Not on file   Social Drivers of Health   Financial Resource Strain: Low Risk  (05/02/2023)   Overall Financial Resource Strain (CARDIA)    Difficulty of Paying Living Expenses: Not hard at all  Food Insecurity: No Food Insecurity (05/02/2023)   Hunger Vital Sign    Worried About Running Out of Food in the Last Year: Never true    Ran Out of Food in the Last Year: Never true  Transportation Needs: No Transportation Needs (05/02/2023)   PRAPARE - Administrator, Civil Service (Medical): No    Lack of Transportation (Non-Medical): No  Physical Activity: Inactive  (05/02/2023)   Exercise Vital Sign    Days of Exercise per Week: 0 days    Minutes of Exercise per Session: 0 min  Stress: No Stress Concern Present (05/02/2023)   [employer] of Occupational Health - Occupational Stress Questionnaire    Feeling of Stress : Only a little  Social Connections: Unknown (05/02/2023)   Social Connection and Isolation Panel    Frequency of Communication with Friends and Family: More than three times a week    Frequency of Social Gatherings with Friends and Family: Three times a week    Attends Religious Services: Never    Active Member of Clubs or Organizations: No    Attends Banker Meetings: Never    Marital Status: Patient declined    Objective:  BP 114/70   Pulse 73   Temp (!) 97 F (36.1 C)   Ht 5' 5 (1.651 m)   Wt 199 lb (90.3 kg)   SpO2 98%   BMI 33.12 kg/m      06/24/2023    8:33 AM 05/10/2023    1:33 PM 05/02/2023    1:36 PM  BP/Weight  Systolic BP 114 120 --  Diastolic BP 70 70 --  Wt. (Lbs) 199 200 201  BMI 33.12 kg/m2 33.28 kg/m2 33.45 kg/m2    Physical Exam Vitals and nursing note reviewed.  HENT:     Head: Normocephalic and atraumatic.   Eyes:     Pupils: Pupils are equal, round, and reactive to light.    Cardiovascular:     Rate and Rhythm: Normal rate and regular rhythm.  Pulmonary:     Effort: Pulmonary effort is normal.  Breath sounds: Normal breath sounds.   Musculoskeletal:        General: Tenderness (right trochanteric bursa tenderness) present.     Cervical back: Normal range of motion.         Lab Results  Component Value Date   WBC 8.8 05/10/2023   HGB 12.2 05/10/2023   HCT 36.3 05/10/2023   PLT 257.0 05/10/2023   GLUCOSE 111 (H) 03/21/2023   CHOL 122 03/21/2023   TRIG 87 03/21/2023   HDL 52 03/21/2023   LDLCALC 53 03/21/2023   ALT 12 03/21/2023   AST 14 03/21/2023   NA 140 03/21/2023   K 3.6 03/21/2023   CL 98 03/21/2023   CREATININE 0.91 03/21/2023   BUN 15 03/21/2023    CO2 25 03/21/2023   TSH 4.20 01/12/2021   INR 1.0 07/15/2018   HGBA1C 5.9 (H) 03/21/2023      Assessment & Plan:  Essential (primary) hypertension Assessment & Plan: BP well controlled on Losartan  25 mg daily,   Orders: -     ECHOCARDIOGRAM COMPLETE; Future  Hyperlipidemia LDL goal <130 Assessment & Plan: On lipitor 20 mg daily Will repeat lipids in 3 months at the next visit  Orders: -     ECHOCARDIOGRAM COMPLETE; Future  Major depressive disorder, recurrent, moderate (HCC) Assessment & Plan: On venlafaxine  (Effexor ) 225 mg for mood and depression, which is effective. - Continue venlafaxine  (Effexor ) 225 mg daily    Exertional shortness of breath Assessment & Plan: Persistent shortness of breath during minimal exertion, such as walking to the mailbox. No associated chest pain. Previous echocardiogram in 2022 showed an ejection fraction of 50%. Repeat echocardiogram is warranted to assess cardiac function. - Order echocardiogram at the cardiology clinic in [place]. - continue lasix  as needed - continue being mindful of sodium intake  Orders: -     ECHOCARDIOGRAM COMPLETE; Future  Age-related osteoporosis without current pathological fracture Assessment & Plan: Bone density test showed osteoporosis in the thigh and osteopenia in the forearms. Not currently taking calcium  with vitamin D  supplements. Discussed benefits of Fosamax for increasing bone density and administration protocol to minimize gastrointestinal side effects. Fosamax is taken once weekly with a full glass of water , remaining upright for 30 minutes post-ingestion to prevent heartburn. - Prescribe Fosamax to be taken once weekly with a full glass of water , remaining upright for 30 minutes post-ingestion. - Advise to start taking calcium  with vitamin D  supplements.   Borderline diabetes Assessment & Plan: She DOES NOT HAVE A HISTORY OF DIABETES. She only has borderline diabetes. Last A1C 5.9 in  March.  Will recheck labs in 3 months  Orders: -     ECHOCARDIOGRAM COMPLETE; Future  Right hip pain Assessment & Plan: Pain in the right lateral hip, and significant tenderness over the right trochanteric bursa consistent with trochanteric bursitis. No pain in the gluteal region or groin. Has not tried topical treatments or exercises for relief. Discussed initial management with topical treatments and exercises. Consider injection if no relief after 2-3 weeks. - Advise use of Biofreeze or Bengay on the affected area. - Provide information on exercises for trochanteric bursitis. - Reassess in 2-3 weeks; consider injection if no relief.            Celiac disease Celiac disease with elevated anti-gliadin antibodies. Adheres to a gluten-free diet. Recent blood work showed normal levels of iron, vitamin B12, and vitamins A, D, and E.  Constipation Ongoing constipation despite Linzess , which results in watery stools shortly  after ingestion. No recent colonoscopy since November 2022, where multiple adenomas were removed. Next colonoscopy scheduled for January 2027. - Continue current management with Linzess . - Monitor bowel movements and report any changes to gastroenterology.  General Health Maintenance Recent blood work showed well-controlled blood glucose levels with an A1c of 5.9%, indicating borderline diabetes. Electrolytes, kidney function, and cholesterol levels are within normal limits.  Follow-up Reassessment of current conditions and management plans. - Schedule follow-up appointment in three months.    Other orders -     Alendronate Sodium; Take 1 tablet (70 mg total) by mouth every 7 (seven) days. Take with a full glass of water  on an empty stomach.  Dispense: 4 tablet; Refill: 11     Meds ordered this encounter  Medications   alendronate (FOSAMAX) 70 MG tablet    Sig: Take 1 tablet (70 mg total) by mouth every 7 (seven) days. Take with a full glass of water  on  an empty stomach.    Dispense:  4 tablet    Refill:  11    Orders Placed This Encounter  Procedures   ECHOCARDIOGRAM COMPLETE   Assessment and Plan            Follow-up: Return in about 3 months (around 09/24/2023) for chronic disease follow up.  An After Visit Summary was printed and given to the patient.  Cayce Paschal, MD [facility] [phone]

## 2023-06-24 NOTE — Patient Instructions (Signed)
  VISIT SUMMARY: Today, we addressed your ongoing issues with leg swelling, shortness of breath, hip pain, osteoporosis, celiac disease, and constipation. We also reviewed your recent blood work and discussed your general health maintenance.  YOUR PLAN: DYSPNEA ON EXERTION: You continue to experience shortness of breath during minimal exertion, such as walking to the mailbox. There is no associated chest pain. -We will order an echocardiogram at the cardiology clinic near [facility] to assess your cardiac function.  TROCHANTERIC BURSITIS OF THE RIGHT HIP: You have pain in your right lateral hip, which is consistent with trochanteric bursitis. -Use Biofreeze or Bengay on the affected area for relief. -We will provide you with information on exercises for trochanteric bursitis. -We will reassess your condition in 2-3 weeks and consider an injection if there is no relief.  OSTEOPOROSIS: Your bone density test showed osteoporosis in your thigh and osteopenia in your forearms. -We will prescribe Fosamax to be taken once weekly with a full glass of water . You should remain upright for 30 minutes after taking it to prevent heartburn. -Start taking calcium  with vitamin D  supplements.  CELIAC DISEASE: You have celiac disease with elevated anti-gliadin antibodies and adhere to a gluten-free diet. -Continue your gluten-free diet.  CONSTIPATION: You have ongoing constipation despite taking Linzess , which results in watery stools shortly after ingestion. -Continue your current management with Linzess . -Monitor your bowel movements and report any changes to gastroenterology.  GENERAL HEALTH MAINTENANCE: Your recent blood work showed well-controlled blood glucose levels with an A1c of 5.9%, indicating borderline diabetes. Electrolytes, kidney function, and cholesterol levels are within normal limits. -Schedule a follow-up appointment in three months to reassess your conditions and  management plans.                      Contains text generated by [employer].                                 Contains text generated by [employer].

## 2023-06-24 NOTE — Assessment & Plan Note (Signed)
 Persistent shortness of breath during minimal exertion, such as walking to the mailbox. No associated chest pain. Previous echocardiogram in 2022 showed an ejection fraction of 50%. Repeat echocardiogram is warranted to assess cardiac function. - Order echocardiogram at the cardiology clinic in [place]. - continue lasix  as needed - continue being mindful of sodium intake

## 2023-06-24 NOTE — Assessment & Plan Note (Signed)
 She DOES NOT HAVE A HISTORY OF DIABETES. She only has borderline diabetes. Last A1C 5.9 in March.  Will recheck labs in 3 months

## 2023-06-24 NOTE — Assessment & Plan Note (Signed)
 On venlafaxine (Effexor) 225 mg for mood and depression, which is effective. - Continue venlafaxine (Effexor) 225 mg daily

## 2023-06-24 NOTE — Assessment & Plan Note (Addendum)
 Pain in the right lateral hip, and significant tenderness over the right trochanteric bursa consistent with trochanteric bursitis. No pain in the gluteal region or groin. Has not tried topical treatments or exercises for relief. Discussed initial management with topical treatments and exercises. Consider injection if no relief after 2-3 weeks. - Advise use of Biofreeze or Bengay on the affected area. - Provide information on exercises for trochanteric bursitis. - Reassess in 2-3 weeks; consider injection if no relief.            Celiac disease Celiac disease with elevated anti-gliadin antibodies. Adheres to a gluten-free diet. Recent blood work showed normal levels of iron, vitamin B12, and vitamins A, D, and E.  Constipation Ongoing constipation despite Linzess , which results in watery stools shortly after ingestion. No recent colonoscopy since November 2022, where multiple adenomas were removed. Next colonoscopy scheduled for January 2027. - Continue current management with Linzess . - Monitor bowel movements and report any changes to gastroenterology.  General Health Maintenance Recent blood work showed well-controlled blood glucose levels with an A1c of 5.9%, indicating borderline diabetes. Electrolytes, kidney function, and cholesterol levels are within normal limits.  Follow-up Reassessment of current conditions and management plans. - Schedule follow-up appointment in three months.

## 2023-06-24 NOTE — Assessment & Plan Note (Signed)
 Bone density test showed osteoporosis in the thigh and osteopenia in the forearms. Not currently taking calcium  with vitamin D  supplements. Discussed benefits of Fosamax for increasing bone density and administration protocol to minimize gastrointestinal side effects. Fosamax is taken once weekly with a full glass of water , remaining upright for 30 minutes post-ingestion to prevent heartburn. - Prescribe Fosamax to be taken once weekly with a full glass of water , remaining upright for 30 minutes post-ingestion. - Advise to start taking calcium  with vitamin D  supplements.

## 2023-06-24 NOTE — Assessment & Plan Note (Signed)
 On lipitor 20 mg daily Will repeat lipids in 3 months at the next visit

## 2023-06-24 NOTE — Assessment & Plan Note (Signed)
 BP well controlled on Losartan  25 mg daily,

## 2023-06-25 ENCOUNTER — Other Ambulatory Visit: Payer: Self-pay

## 2023-07-02 ENCOUNTER — Other Ambulatory Visit: Payer: Self-pay

## 2023-07-04 ENCOUNTER — Ambulatory Visit

## 2023-07-04 DIAGNOSIS — I10 Essential (primary) hypertension: Secondary | ICD-10-CM | POA: Diagnosis not present

## 2023-07-04 DIAGNOSIS — R0602 Shortness of breath: Secondary | ICD-10-CM | POA: Diagnosis not present

## 2023-07-04 DIAGNOSIS — R7303 Prediabetes: Secondary | ICD-10-CM

## 2023-07-04 DIAGNOSIS — E785 Hyperlipidemia, unspecified: Secondary | ICD-10-CM | POA: Diagnosis not present

## 2023-07-04 DIAGNOSIS — I5030 Unspecified diastolic (congestive) heart failure: Secondary | ICD-10-CM | POA: Diagnosis not present

## 2023-07-05 LAB — ECHOCARDIOGRAM COMPLETE
AR max vel: 2.44 cm2
AV Area VTI: 2.4 cm2
AV Area mean vel: 2.44 cm2
AV Mean grad: 2.5 mmHg
AV Peak grad: 5.3 mmHg
Ao pk vel: 1.15 m/s
Area-P 1/2: 3.48 cm2
MV VTI: 1.15 cm2

## 2023-07-08 ENCOUNTER — Ambulatory Visit: Payer: Self-pay

## 2023-08-12 ENCOUNTER — Other Ambulatory Visit: Payer: Self-pay

## 2023-08-20 ENCOUNTER — Other Ambulatory Visit: Payer: Self-pay

## 2023-08-20 MED ORDER — BUPROPION HCL ER (XL) 150 MG PO TB24: 150.0000 mg | ORAL_TABLET | Freq: Every day | ORAL | 1 refills | Status: DC

## 2023-08-24 ENCOUNTER — Other Ambulatory Visit: Payer: Self-pay

## 2023-09-02 ENCOUNTER — Other Ambulatory Visit: Payer: Self-pay

## 2023-09-02 ENCOUNTER — Other Ambulatory Visit: Payer: Self-pay | Admitting: Physician Assistant

## 2023-09-02 DIAGNOSIS — I10 Essential (primary) hypertension: Secondary | ICD-10-CM

## 2023-09-12 IMAGING — MG MM DIGITAL SCREENING BILAT W/ TOMO AND CAD
8 series · 8 of 24 positions shown · non-contrast
Comparison: Previous exam(s).

ACR Breast Density Category a: The breast tissue is almost entirely
fatty.

CLINICAL DATA: Screening.

EXAM:
DIGITAL SCREENING BILATERAL MAMMOGRAM WITH TOMOSYNTHESIS AND CAD
TECHNIQUE: Bilateral screening digital craniocaudal and mediolateral oblique
mammograms were obtained. Bilateral screening digital breast
tomosynthesis was performed. The images were evaluated with
computer-aided detection.

[L CC synth-2D]
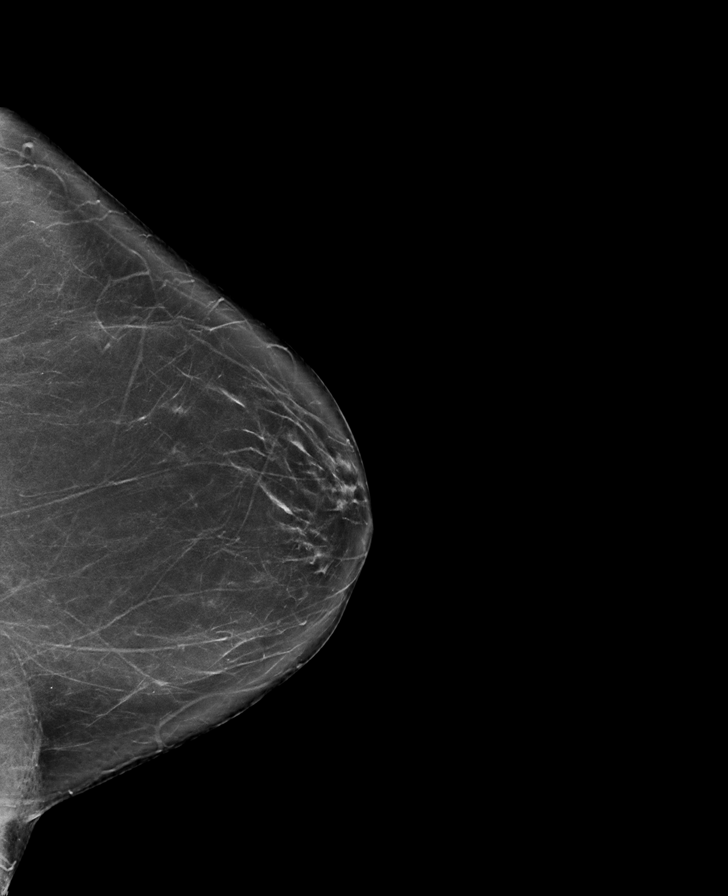

[L MLO synth-2D]
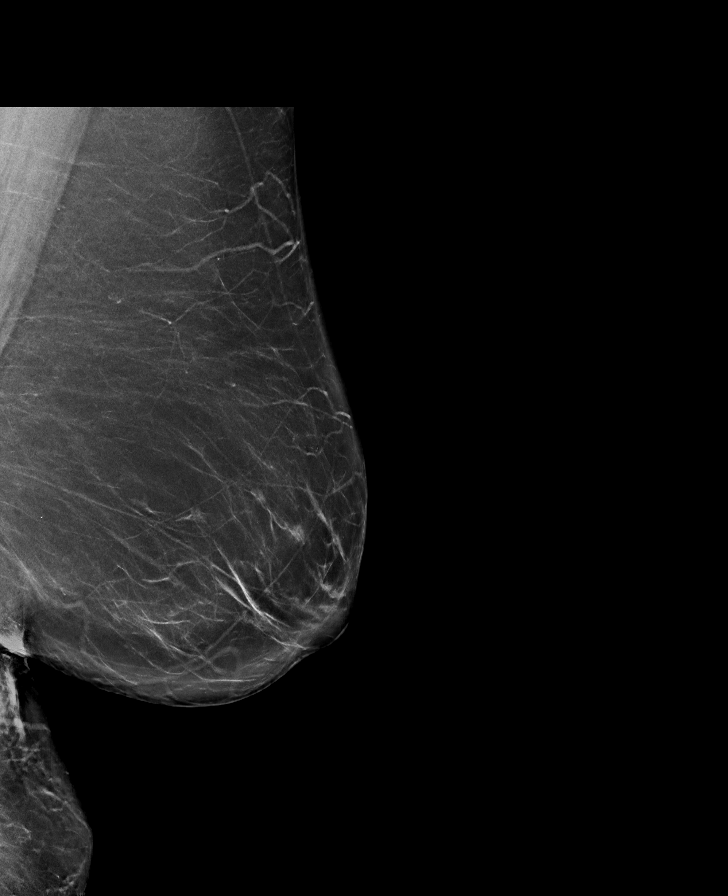

[R CC synth-2D]
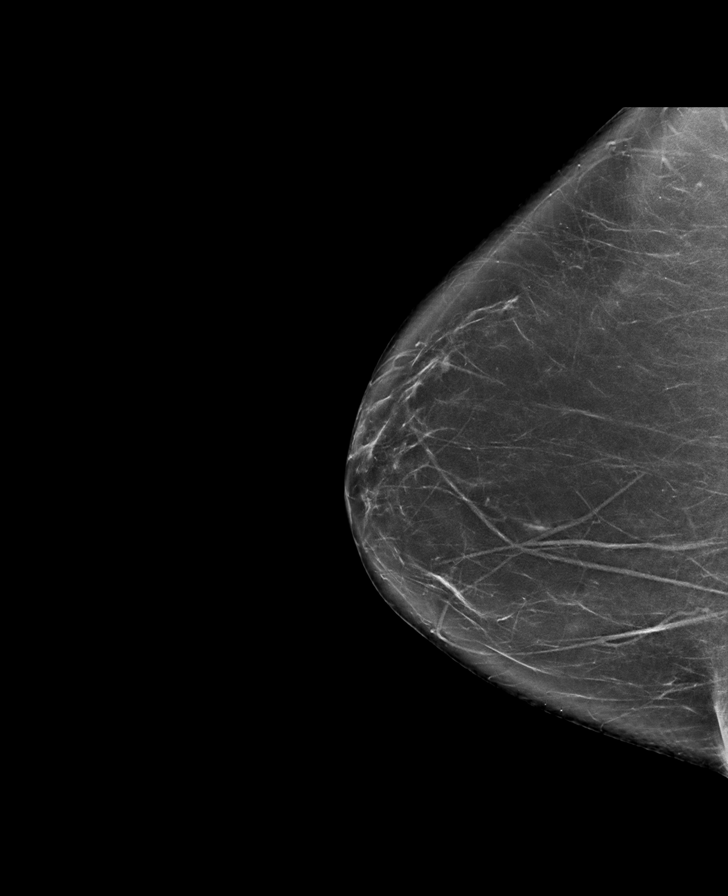

[R MLO synth-2D]
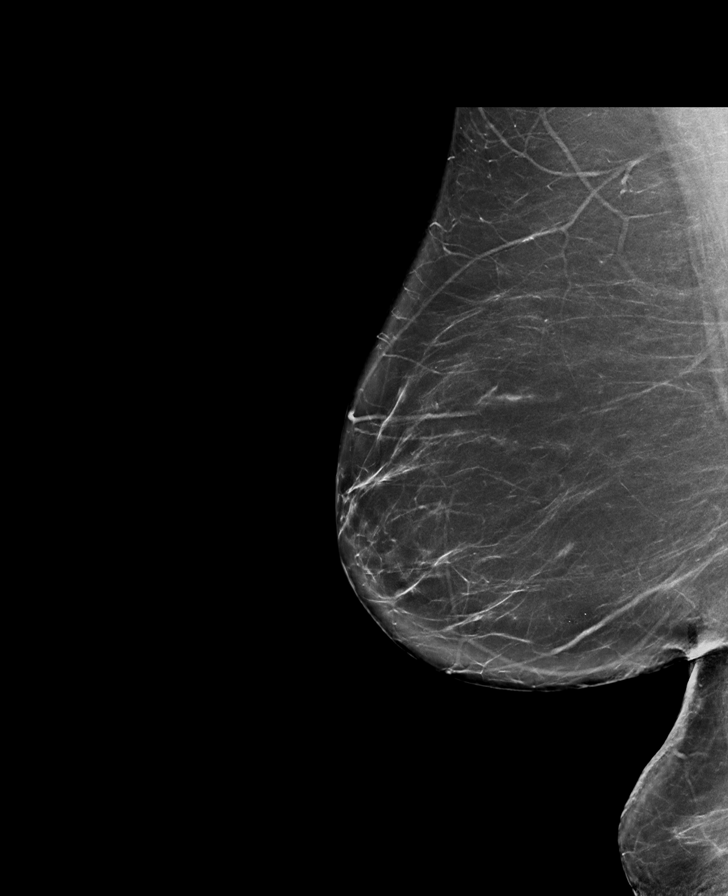

[R CC tomo · tomo slice 39/78.0]
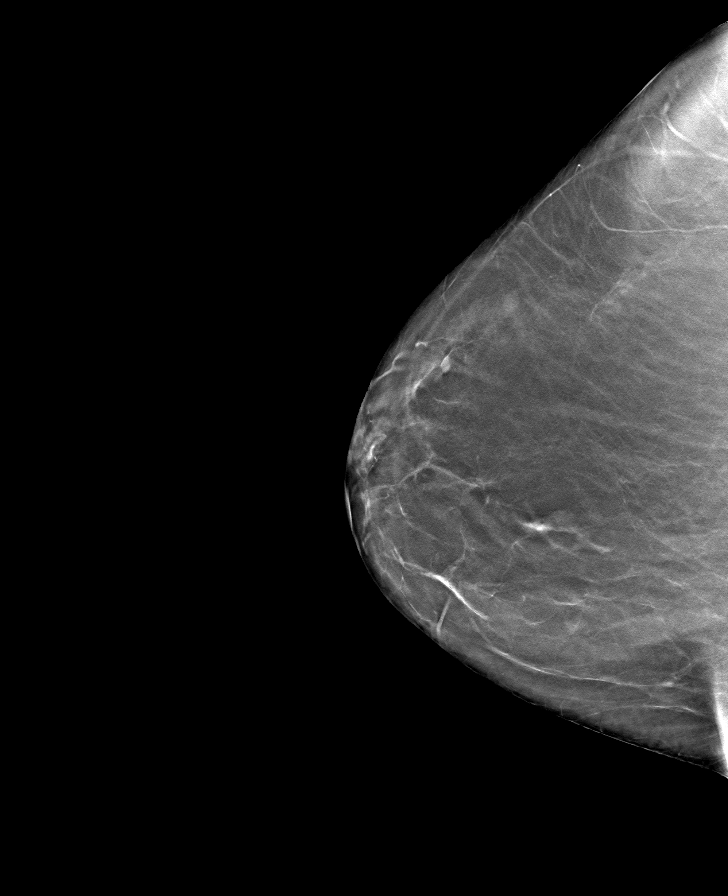

[R MLO tomo · tomo slice 43/84.0]
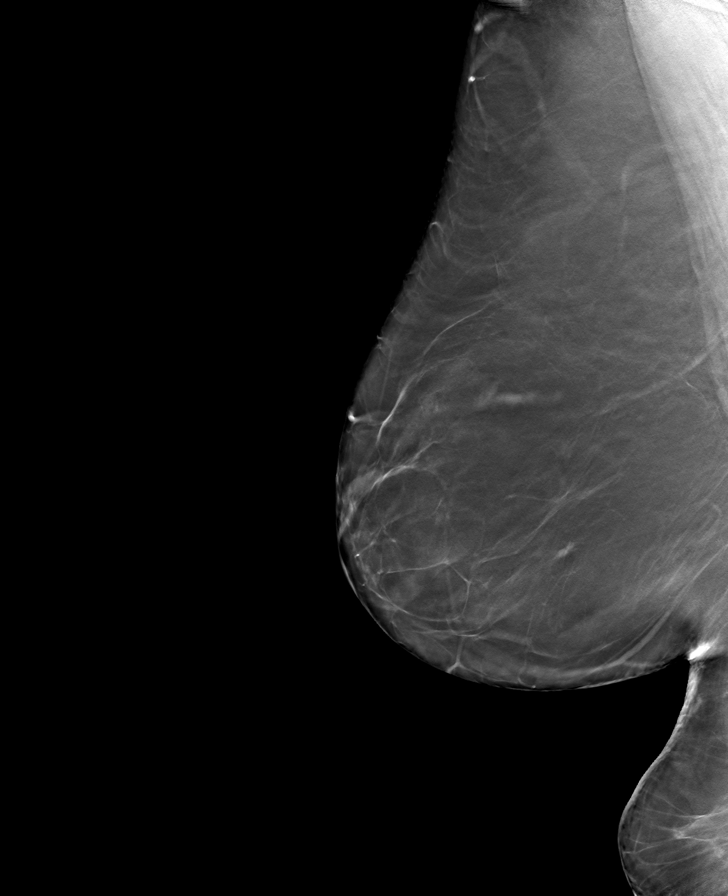

[L CC tomo · tomo slice 39/77.0]
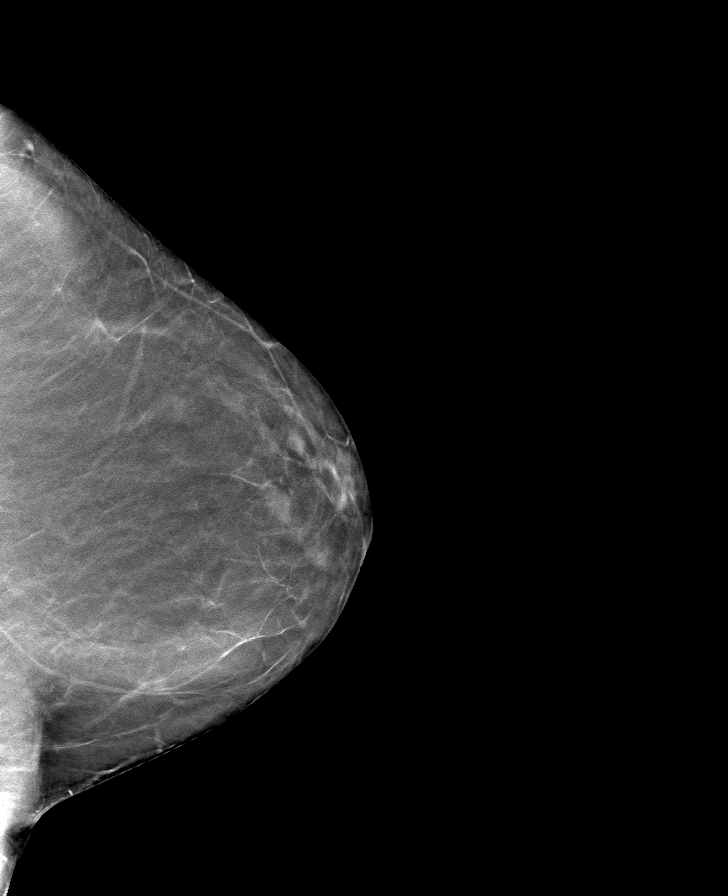

[L MLO tomo · tomo slice 44/87.0]
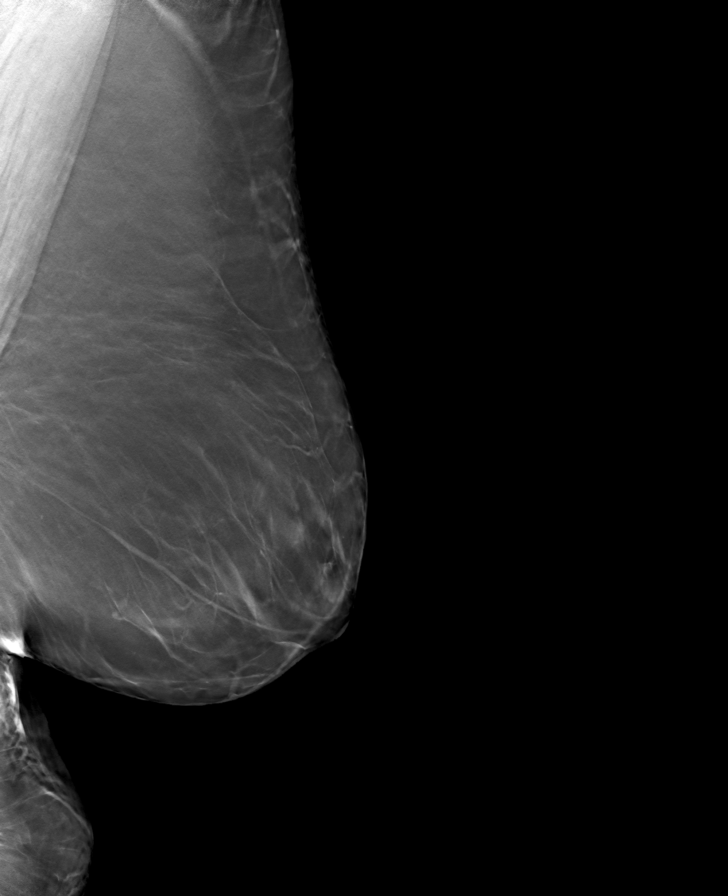

[8 of 24 positions shown; findings below may reference images not displayed]

FINDINGS: There are no findings suspicious for malignancy.
IMPRESSION: No mammographic evidence of malignancy. A result letter of this
screening mammogram will be mailed directly to the patient.

RECOMMENDATION:
Screening mammogram in one year. (Code:0E-3-N98)

BI-RADS CATEGORY  1: Negative.

## 2023-09-15 ENCOUNTER — Other Ambulatory Visit: Payer: Self-pay | Admitting: Family Medicine

## 2023-09-15 ENCOUNTER — Other Ambulatory Visit: Payer: Self-pay

## 2023-09-15 DIAGNOSIS — F331 Major depressive disorder, recurrent, moderate: Secondary | ICD-10-CM

## 2023-09-15 DIAGNOSIS — K219 Gastro-esophageal reflux disease without esophagitis: Secondary | ICD-10-CM

## 2023-09-16 ENCOUNTER — Encounter: Payer: Self-pay | Admitting: Physician Assistant

## 2023-09-17 ENCOUNTER — Other Ambulatory Visit: Payer: Self-pay

## 2023-09-30 ENCOUNTER — Ambulatory Visit

## 2023-09-30 VITALS — BP 106/70 | HR 72 | Temp 98.1°F | Ht 65.0 in | Wt 203.0 lb

## 2023-09-30 DIAGNOSIS — Z1159 Encounter for screening for other viral diseases: Secondary | ICD-10-CM | POA: Diagnosis not present

## 2023-09-30 DIAGNOSIS — E785 Hyperlipidemia, unspecified: Secondary | ICD-10-CM | POA: Diagnosis not present

## 2023-09-30 DIAGNOSIS — I10 Essential (primary) hypertension: Secondary | ICD-10-CM | POA: Diagnosis not present

## 2023-09-30 DIAGNOSIS — F331 Major depressive disorder, recurrent, moderate: Secondary | ICD-10-CM

## 2023-09-30 DIAGNOSIS — R7303 Prediabetes: Secondary | ICD-10-CM | POA: Diagnosis not present

## 2023-09-30 DIAGNOSIS — J849 Interstitial pulmonary disease, unspecified: Secondary | ICD-10-CM | POA: Insufficient documentation

## 2023-09-30 DIAGNOSIS — Z23 Encounter for immunization: Secondary | ICD-10-CM | POA: Diagnosis not present

## 2023-09-30 DIAGNOSIS — R1310 Dysphagia, unspecified: Secondary | ICD-10-CM | POA: Diagnosis not present

## 2023-09-30 MED ORDER — CARIPRAZINE HCL 1.5 MG PO CAPS: 1.5000 mg | ORAL_CAPSULE | Freq: Every day | ORAL | 1 refills | Status: AC

## 2023-09-30 MED ORDER — WEGOVY 0.25 MG/0.5ML ~~LOC~~ SOAJ: 0.2500 mg | SUBCUTANEOUS | 3 refills | Status: AC

## 2023-09-30 NOTE — Assessment & Plan Note (Addendum)
 She DOES NOT HAVE A HISTORY OF DIABETES. She only has borderline diabetes. Last A1C 5.9 in March.  Will recheck labs

## 2023-09-30 NOTE — Assessment & Plan Note (Addendum)
  Chronic depression with persistent symptoms despite current medication regimen. Currently on Wellbutrin  150 mg daily, Effexor  225 mg daily, and trazodone  at bedtime. No relief from Effexor  despite long-term use. Consideration of medication adjustment or addition. Discussed potential for increasing Wellbutrin  but noted risk of seizures at higher doses. Encouraged lifestyle modifications including regular exercise and engaging in hobbies to improve mood. - Review current medications and consider adding or changing medication for depression. AFTER REVIEW, DECIDED TO TRIAL HER ON VRAYLAR 1.5 MG ONCE DAILY, AS AN ADD ON TO HER WELLBUTRIN  AND EFFEXOR .  - if she does not see a difference, will consider tapering off effexor  and trying either a ZOLOFT or another SSRI.  - Encourage regular exercise and engaging in hobbies. - Follow up on medication adjustment options.

## 2023-09-30 NOTE — Patient Instructions (Signed)
  VISIT SUMMARY: Today, we discussed your ongoing depressive symptoms, difficulty swallowing, recent weight gain, and general health maintenance. We reviewed your current medications and considered adjustments to better manage your depression. We also addressed your swallowing difficulties and weight management options.  YOUR PLAN: DEPRESSION: You have been experiencing persistent depressive symptoms despite your current medications. -We will review your current medications and consider adding or changing them to better manage your depression. -Engage in regular exercise and hobbies to help improve your mood. -We will follow up on medication adjustment options.  DYSPHAGIA: You have been having difficulty swallowing both solids and liquids, which has led to choking episodes. -We will order a swallow evaluation to check for any blockages or anatomical issues. -Crush your medications and take them with applesauce or cut pills in half and take with water . -Cut your food into small bite-sized pieces and chew well.  OBESITY: You have gained 4 pounds since June and are interested in weight loss to improve your mood and activity level. -We will prescribe Wegovy for weight loss, pending insurance approval. -Engage in daily exercise for 30-45 minutes. -Maintain a daily calorie intake of 1300-1400 kcal. -We will send the prescription to [facility] pharmacy for better insurance coverage.  GENERAL HEALTH MAINTENANCE: We discussed the importance of regular health screenings. -We will order a hepatitis C screening as a one-time test.                      Contains text generated by Abridge.                                 Contains text generated by Abridge.

## 2023-09-30 NOTE — Assessment & Plan Note (Addendum)
 Patient has tried dietary and lifestyle modifications including calorie restriction, weight loss behavior modification and increased physical activity for more than 6 months and patient has not been able to lose 5% of her body weight.  It is medically necessary for the patient to add pharmacotherapy to their obesity treatment plan since they have been unable to achieve clinically significant results with active enrollment and participation in lifestyle changes.  Patient will continue lifestyle modifications in addition to starting pharmacotherapy to treat their obesity, including reduced calorie diet, increased physical activity and behavior changes.  Patient's BMI is 33.78 prior to starting medication to treat her obesity, pharmacotherapy is recommended following AACE guidelines. My patient has no contraindications to [facility], including no personal or family history of medullary thyroid  carcinoma and no personal or family history of multiple endocrine neoplasia syndrome type II.  Additionally my patient is not currently taking any other GLP-1 receptor agonist medications.  The patient will adhere to 1400 K calories per day and 200  minutes of exercise weekly.

## 2023-09-30 NOTE — Assessment & Plan Note (Addendum)
 Dysphagia Chronic difficulty swallowing solids and liquids, leading to choking episodes. Presence of upper dentures but absence of lower dentures may affect chewing ability. - Order swallow evaluation to assess for blockages or anatomical issues. - Advise crushing medications and taking with applesauce or cutting pills in half and taking with water . - Recommend cutting food into small bite-sized pieces and chewing well. Orders:   DG ESOPHAGUS W SINGLE CM (SOL OR THIN BA); Future

## 2023-09-30 NOTE — Assessment & Plan Note (Addendum)
 On lipitor 20 mg daily Will repeat lipids

## 2023-09-30 NOTE — Assessment & Plan Note (Addendum)
  Orders:   CBC with Differential   Comprehensive metabolic panel   Lipid Panel   Hemoglobin A1c

## 2023-09-30 NOTE — Progress Notes (Signed)
 Subjective:  Patient ID: ICYSS SKOG, female    DOB: February 12, 1960  Age: 63 y.o. MRN: [id number]  Chief Complaint  Patient presents with   Medical Management of Chronic Issues    HPI: Discussed the use of AI scribe software for clinical note transcription with the patient, who gave verbal consent to proceed.   History of Present Illness   CHERREE CONERLY is a 63 year old female with depression who presents with persistent depressive symptoms despite medication.  Depressive symptoms - Persistent depressive symptoms with feeling 'down and out' most of the time - Attributes some depressive symptoms to issues with her children - Current medications: Wellbutrin  150 mg daily, Effexor  225 mg daily, Trazodone  at bedtime - No significant relief from current regimen - Long-term use of Effexor  without improvement - Previous trial of Lexapro  without recalled effectiveness  Dysphagia - Difficulty swallowing both solids and liquids - Choking on food and difficulty swallowing medications - Symptoms ongoing for an extended period - Dentures present on the upper jaw, none on the lower jaw  Weight gain - Gained four pounds since June - Current weight is 203 pounds - Denies increased food intake - Desires weight loss, believes it would improve mood and activity level  Metabolic findings - No history of diabetes, but previously told she has borderline diabetes - A1c was 5.9 in March, consistent with borderline range  Respiratory and neurological history - No history of seizures or lung disease - Never smoked - Chest x-ray in July was clear            09/30/2023    9:07 AM 06/24/2023    8:35 AM 05/02/2023    1:39 PM 04/12/2023   10:30 AM 03/21/2023    8:44 AM  Depression screen PHQ 2/9  Decreased Interest 3 3 0 0 3  Down, Depressed, Hopeless 2 3 0 0 3  PHQ - 2 Score 5 6 0 0 6  Altered sleeping 0 3 3  3   Tired, decreased energy 3 3 3  3   Change in appetite 0 2 3  3   Feeling bad  or failure about yourself  0 0 0  0  Trouble concentrating 3 0 0  0  Moving slowly or fidgety/restless 0 0 0  0  Suicidal thoughts 0 0 0  0  PHQ-9 Score 11 14 9  15   Difficult doing work/chores Not difficult at all Somewhat difficult   Somewhat difficult        09/30/2023    9:07 AM  Fall Risk   Falls in the past year? 0  Number falls in past yr: 0  Injury with Fall? 0  Risk for fall due to : No Fall Risks  Follow up Falls evaluation completed    Patient Care Team: Corean Yoshimura, MD as PCP - General (Family Medicine) Hager, Ray A, OD (Optometry)   Review of Systems  Constitutional:  Positive for unexpected weight change (weight gain). Negative for chills, fatigue and fever.  HENT:  Negative for congestion, ear pain, sinus pressure and sore throat.   Respiratory:  Negative for cough and shortness of breath.   Cardiovascular:  Negative for chest pain.  Gastrointestinal:  Negative for abdominal pain, constipation, diarrhea, nausea and vomiting.       Difficulty swallowing  Genitourinary:  Negative for dysuria and frequency.  Musculoskeletal:  Negative for arthralgias, back pain and myalgias.  Neurological:  Negative for dizziness and headaches.  Psychiatric/Behavioral:  Positive for dysphoric  mood. The patient is not nervous/anxious.     Current Outpatient Medications on File Prior to Visit  Medication Sig Dispense Refill   albuterol  (VENTOLIN  HFA) 108 (90 Base) MCG/ACT inhaler Inhale 2 puffs into the lungs every 4 (four) hours as needed for wheezing or shortness of breath. 1 each 1   alendronate  (FOSAMAX ) 70 MG tablet Take 1 tablet (70 mg total) by mouth every 7 (seven) days. Take with a full glass of water  on an empty stomach. 4 tablet 11   atorvastatin  (LIPITOR) 20 MG tablet Take 1 tablet (20 mg total) by mouth at bedtime. 90 tablet 1   Blood Glucose Monitoring Suppl DEVI 1 each by Does not apply route in the morning, at noon, and at bedtime. May substitute to any  manufacturer covered by patient's insurance. 1 each 0   buPROPion  (WELLBUTRIN  XL) 150 MG 24 hr tablet Take 1 tablet (150 mg total) by mouth daily. 90 tablet 1   ferrous sulfate  325 (65 FE) MG EC tablet Take 1 tablet (325 mg total) by mouth daily with breakfast. 90 tablet 1   furosemide  (LASIX ) 20 MG tablet Take 1 tablet (20 mg total) by mouth daily as needed for edema. 30 tablet 0   linaclotide  (LINZESS ) 290 MCG CAPS capsule Take 1 capsule (290 mcg total) by mouth daily before breakfast. Take 30 minutes before a meal 90 capsule 1   losartan  (COZAAR ) 25 MG tablet Take 1 tablet (25 mg total) by mouth daily. 90 tablet 1   meloxicam  (MOBIC ) 15 MG tablet Take 1 tablet (15 mg total) by mouth daily. 90 tablet 1   metoprolol  succinate (TOPROL -XL) 25 MG 24 hr tablet Take 1 tablet (25 mg total) by mouth daily. 90 tablet 1   montelukast  (SINGULAIR ) 10 MG tablet Take 1 tablet (10 mg total) by mouth daily. 30 tablet 2   Multiple Vitamin (MULTIVITAMIN WITH MINERALS) TABS tablet Take 1 tablet by mouth daily.     omeprazole  (PRILOSEC) 20 MG capsule Take 1 capsule (20 mg total) by mouth daily. 90 capsule 1   ondansetron  (ZOFRAN ) 4 MG tablet Take 1 tablet (4 mg total) by mouth every 8 (eight) hours as needed for nausea. 15 tablet 0   ONETOUCH ULTRA test strip daily.     potassium chloride  SA (KLOR-CON  M) 20 MEQ tablet TAKE ONE TABLET BY MOUTH EVERY DAY 90 tablet 1   traZODone  (DESYREL ) 50 MG tablet Take 1 tablet (50 mg total) by mouth at bedtime. 90 tablet 1   venlafaxine  XR (EFFEXOR -XR) 150 MG 24 hr capsule Take 1 capsule (150 mg total) by mouth daily with breakfast. Along with 75 mg XR for total of 225 mg 90 capsule 1   venlafaxine  XR (EFFEXOR -XR) 75 MG 24 hr capsule Take 1 capsule (75 mg total) by mouth daily with breakfast. To take along with 150 mg XR for total of 225 mg 90 capsule 1   No current facility-administered medications on file prior to visit.   Past Medical History:  Diagnosis Date   Anxiety     Arthritis    Cataract    Celiac disease    suspected   Colon polyps    Depression    GERD (gastroesophageal reflux disease)    Hypertension    Osteoporosis    Pre-diabetes    Sleep apnea    Past Surgical History:  Procedure Laterality Date   ABDOMINAL HYSTERECTOMY  1992   BACK SURGERY  1990   colonosocpy  ESOPHAGOGASTRODUODENOSCOPY     EYE SURGERY     TOTAL KNEE ARTHROPLASTY Right 07/18/2018   Procedure: TOTAL KNEE ARTHROPLASTY;  Surgeon: Yvone Rush, MD;  Location: [facility];  Service: Orthopedics;  Laterality: Right;   TOTAL KNEE ARTHROPLASTY Left 07/09/2022   Procedure: TOTAL KNEE ARTHROPLASTY;  Surgeon: Yvone Rush, MD;  Location: [facility];  Service: Orthopedics;  Laterality: Left;   WRIST FRACTURE SURGERY Left    x 2    Family History  Problem Relation Age of Onset   Diabetes Mother    Colon cancer Neg Hx    Colon polyps Neg Hx    Rectal cancer Neg Hx    Esophageal cancer Neg Hx    Stomach cancer Neg Hx    Social History   Socioeconomic History   Marital status: Single    Spouse name: Not on file   Number of children: 2   Years of education: Not on file   Highest education level: Not on file  Occupational History   Occupation: Homemaker  Tobacco Use   Smoking status: Never   Smokeless tobacco: Never  Vaping Use   Vaping status: Never Used  Substance and Sexual Activity   Alcohol  use: Never   Drug use: Never   Sexual activity: Not Currently  Other Topics Concern   Not on file  Social History Narrative   Not on file   Social Drivers of Health   Financial Resource Strain: Low Risk  (05/02/2023)   Overall Financial Resource Strain (CARDIA)    Difficulty of Paying Living Expenses: Not hard at all  Food Insecurity: No Food Insecurity (05/02/2023)   Hunger Vital Sign    Worried About Running Out of Food in the Last Year: Never true    Ran Out of Food in the Last Year: Never true  Transportation Needs: No Transportation Needs (05/02/2023)   PRAPARE -  Administrator, Civil Service (Medical): No    Lack of Transportation (Non-Medical): No  Physical Activity: Inactive (05/02/2023)   Exercise Vital Sign    Days of Exercise per Week: 0 days    Minutes of Exercise per Session: 0 min  Stress: No Stress Concern Present (05/02/2023)   [employer] of Occupational Health - Occupational Stress Questionnaire    Feeling of Stress : Only a little  Social Connections: Unknown (05/02/2023)   Social Connection and Isolation Panel    Frequency of Communication with Friends and Family: More than three times a week    Frequency of Social Gatherings with Friends and Family: Three times a week    Attends Religious Services: Never    Active Member of Clubs or Organizations: No    Attends Banker Meetings: Never    Marital Status: Patient declined    Objective:  BP 106/70   Pulse 72   Temp 98.1 F (36.7 C)   Ht 5' 5 (1.651 m)   Wt 203 lb (92.1 kg)   SpO2 96%   BMI 33.78 kg/m      09/30/2023    8:52 AM 06/24/2023    8:33 AM 05/10/2023    1:33 PM  BP/Weight  Systolic BP 106 114 120  Diastolic BP 70 70 70  Wt. (Lbs) 203 199 200  BMI 33.78 kg/m2 33.12 kg/m2 33.28 kg/m2    Physical Exam Vitals and nursing note reviewed.  Constitutional:      Appearance: She is obese.  HENT:     Head: Normocephalic and atraumatic.  Eyes:  Pupils: Pupils are equal, round, and reactive to light.  Cardiovascular:     Rate and Rhythm: Normal rate and regular rhythm.  Pulmonary:     Effort: Pulmonary effort is normal.     Breath sounds: Normal breath sounds.  Musculoskeletal:        General: Normal range of motion.     Cervical back: Normal range of motion.  Skin:    General: Skin is warm.  Neurological:     General: No focal deficit present.     Mental Status: She is alert.  Psychiatric:        Mood and Affect: Mood normal.         Lab Results  Component Value Date   WBC 8.8 05/10/2023   HGB 12.2 05/10/2023   HCT  36.3 05/10/2023   PLT 257.0 05/10/2023   GLUCOSE 111 (H) 03/21/2023   CHOL 122 03/21/2023   TRIG 87 03/21/2023   HDL 52 03/21/2023   LDLCALC 53 03/21/2023   ALT 12 03/21/2023   AST 14 03/21/2023   NA 140 03/21/2023   K 3.6 03/21/2023   CL 98 03/21/2023   CREATININE 0.91 03/21/2023   BUN 15 03/21/2023   CO2 25 03/21/2023   TSH 4.20 01/12/2021   INR 1.0 07/15/2018   HGBA1C 5.9 (H) 03/21/2023    Results for orders placed or performed in visit on 07/04/23  ECHOCARDIOGRAM COMPLETE   Collection Time: 07/04/23  4:15 PM  Result Value Ref Range   Area-P 1/2 3.48 cm2   AV Area mean vel 2.44 cm2   AV Area VTI 2.40 cm2   MV VTI 1.15 cm2   AR max vel 2.44 cm2   AV Mean grad 2.5 mmHg   Ao pk vel 1.15 m/s   AV Peak grad 5.3 mmHg   Est EF 60 - 65%   .  Assessment & Plan:   Assessment & Plan Essential (primary) hypertension  Orders:   CBC with Differential   Comprehensive metabolic panel   Lipid Panel   Hemoglobin A1c  Encounter for immunization  Orders:   Flu vaccine, recombinant, trivalent, inj  Morbid (severe) obesity due to excess calories (HCC) Patient has tried dietary and lifestyle modifications including calorie restriction, weight loss behavior modification and increased physical activity for more than 6 months and patient has not been able to lose 5% of her body weight.  It is medically necessary for the patient to add pharmacotherapy to their obesity treatment plan since they have been unable to achieve clinically significant results with active enrollment and participation in lifestyle changes.  Patient will continue lifestyle modifications in addition to starting pharmacotherapy to treat their obesity, including reduced calorie diet, increased physical activity and behavior changes.  Patient's BMI is 33.78 prior to starting medication to treat her obesity, pharmacotherapy is recommended following AACE guidelines. My patient has no contraindications to [facility],  including no personal or family history of medullary thyroid  carcinoma and no personal or family history of multiple endocrine neoplasia syndrome type II.  Additionally my patient is not currently taking any other GLP-1 receptor agonist medications.  The patient will adhere to 1400 K calories per day and 200  minutes of exercise weekly.        Dysphagia, unspecified type Dysphagia Chronic difficulty swallowing solids and liquids, leading to choking episodes. Presence of upper dentures but absence of lower dentures may affect chewing ability. - Order swallow evaluation to assess for blockages or anatomical issues. - Advise crushing  medications and taking with applesauce or cutting pills in half and taking with water . - Recommend cutting food into small bite-sized pieces and chewing well. Orders:   DG ESOPHAGUS W SINGLE CM (SOL OR THIN BA); Future  Borderline diabetes She DOES NOT HAVE A HISTORY OF DIABETES. She only has borderline diabetes. Last A1C 5.9 in March.  Will recheck labs     Hyperlipidemia LDL goal <130 On lipitor 20 mg daily Will repeat lipids     Encounter for hepatitis C screening test for low risk patient  Orders:   Hepatitis C antibody  Major depressive disorder, recurrent, moderate (HCC)  Chronic depression with persistent symptoms despite current medication regimen. Currently on Wellbutrin  150 mg daily, Effexor  225 mg daily, and trazodone  at bedtime. No relief from Effexor  despite long-term use. Consideration of medication adjustment or addition. Discussed potential for increasing Wellbutrin  but noted risk of seizures at higher doses. Encouraged lifestyle modifications including regular exercise and engaging in hobbies to improve mood. - Review current medications and consider adding or changing medication for depression. AFTER REVIEW, DECIDED TO TRIAL HER ON VRAYLAR 1.5 MG ONCE DAILY, AS AN ADD ON TO HER WELLBUTRIN  AND EFFEXOR .  - if she does not see a  difference, will consider tapering off effexor  and trying either a ZOLOFT or another SSRI.  - Encourage regular exercise and engaging in hobbies. - Follow up on medication adjustment options.      Body mass index is 33.78 kg/m.  Assessment and Plan Assessment and Plan       General Health Maintenance Agreed to hepatitis C screening as a one-time test. - Order hepatitis C screening.           Meds ordered this encounter  Medications   cariprazine (VRAYLAR) 1.5 MG capsule    Sig: Take 1 capsule (1.5 mg total) by mouth daily.    Dispense:  30 capsule    Refill:  1   semaglutide-weight management (WEGOVY) 0.25 MG/0.5ML SOAJ SQ injection    Sig: Inject 0.25 mg into the skin once a week.    Dispense:  2 mL    Refill:  3    Orders Placed This Encounter  Procedures   DG ESOPHAGUS W SINGLE CM (SOL OR THIN BA)   Flu vaccine, recombinant, trivalent, inj   CBC with Differential   Comprehensive metabolic panel   Lipid Panel   Hemoglobin A1c   Hepatitis C antibody       Follow-up: No follow-ups on file.  An After Visit Summary was printed and given to the patient.  Andros Channing, MD [facility] [phone]

## 2023-10-01 LAB — CBC WITH DIFFERENTIAL/PLATELET
Basophils Absolute: 0.1 x10E3/uL (ref 0.0–0.2)
Basos: 1 %
EOS (ABSOLUTE): 0.3 x10E3/uL (ref 0.0–0.4)
Eos: 3 %
Hematocrit: 38.9 % (ref 34.0–46.6)
Hemoglobin: 12.4 g/dL (ref 11.1–15.9)
Immature Grans (Abs): 0 x10E3/uL (ref 0.0–0.1)
Immature Granulocytes: 0 %
Lymphocytes Absolute: 2.4 x10E3/uL (ref 0.7–3.1)
Lymphs: 25 %
MCH: 29.4 pg (ref 26.6–33.0)
MCHC: 31.9 g/dL (ref 31.5–35.7)
MCV: 92 fL (ref 79–97)
Monocytes Absolute: 0.7 x10E3/uL (ref 0.1–0.9)
Monocytes: 8 %
Neutrophils Absolute: 6 x10E3/uL (ref 1.4–7.0)
Neutrophils: 63 %
Platelets: 268 x10E3/uL (ref 150–450)
RBC: 4.22 x10E6/uL (ref 3.77–5.28)
RDW: 13.4 % (ref 11.7–15.4)
WBC: 9.5 x10E3/uL (ref 3.4–10.8)

## 2023-10-01 LAB — COMPREHENSIVE METABOLIC PANEL WITH GFR
ALT: 14 IU/L (ref 0–32)
AST: 15 IU/L (ref 0–40)
Albumin: 4.3 g/dL (ref 3.9–4.9)
Alkaline Phosphatase: 126 IU/L (ref 49–135)
BUN/Creatinine Ratio: 13 (ref 12–28)
BUN: 13 mg/dL (ref 8–27)
Bilirubin Total: 0.4 mg/dL (ref 0.0–1.2)
CO2: 24 mmol/L (ref 20–29)
Calcium: 9.5 mg/dL (ref 8.7–10.3)
Chloride: 99 mmol/L (ref 96–106)
Creatinine, Ser: 1.01 mg/dL — ABNORMAL HIGH (ref 0.57–1.00)
Globulin, Total: 2.3 g/dL (ref 1.5–4.5)
Glucose: 102 mg/dL — ABNORMAL HIGH (ref 70–99)
Potassium: 4.3 mmol/L (ref 3.5–5.2)
Sodium: 140 mmol/L (ref 134–144)
Total Protein: 6.6 g/dL (ref 6.0–8.5)
eGFR: 63 mL/min/1.73 (ref >59)

## 2023-10-01 LAB — HEMOGLOBIN A1C
Est. average glucose Bld gHb Est-mCnc: 131 mg/dL
Hgb A1c MFr Bld: 6.2 % — ABNORMAL HIGH (ref 4.8–5.6)

## 2023-10-01 LAB — LIPID PANEL
Chol/HDL Ratio: 2.5 ratio (ref 0.0–4.4)
Cholesterol, Total: 135 mg/dL (ref 100–199)
HDL: 55 mg/dL (ref >39)
LDL Chol Calc (NIH): 63 mg/dL (ref 0–99)
Triglycerides: 93 mg/dL (ref 0–149)
VLDL Cholesterol Cal: 17 mg/dL (ref 5–40)

## 2023-10-01 LAB — HEPATITIS C ANTIBODY: Hep C Virus Ab: NONREACTIVE

## 2023-10-04 ENCOUNTER — Ambulatory Visit: Payer: Self-pay

## 2023-10-15 ENCOUNTER — Ambulatory Visit (HOSPITAL_COMMUNITY)
Admission: RE | Admit: 2023-10-15 | Discharge: 2023-10-15 | Disposition: A | Discharge status: Home / Self Care | Source: Ambulatory Visit

## 2023-10-15 DIAGNOSIS — K449 Diaphragmatic hernia without obstruction or gangrene: Secondary | ICD-10-CM | POA: Diagnosis not present

## 2023-10-15 DIAGNOSIS — R1310 Dysphagia, unspecified: Secondary | ICD-10-CM | POA: Diagnosis not present

## 2023-10-15 DIAGNOSIS — K224 Dyskinesia of esophagus: Secondary | ICD-10-CM | POA: Diagnosis not present

## 2023-10-16 MED ORDER — METOCLOPRAMIDE HCL 5 MG PO TABS: 5.0000 mg | ORAL_TABLET | Freq: Two times a day (BID) | ORAL | 1 refills | Status: DC

## 2023-10-21 ENCOUNTER — Other Ambulatory Visit: Payer: Self-pay

## 2023-10-21 DIAGNOSIS — F331 Major depressive disorder, recurrent, moderate: Secondary | ICD-10-CM

## 2023-10-21 MED ORDER — FUROSEMIDE 20 MG PO TABS: 20.0000 mg | ORAL_TABLET | Freq: Every day | ORAL | 0 refills | Status: DC | PRN

## 2023-10-22 MED ORDER — TRAZODONE HCL 50 MG PO TABS: 50.0000 mg | ORAL_TABLET | Freq: Every day | ORAL | 1 refills | Status: AC

## 2023-11-05 ENCOUNTER — Other Ambulatory Visit: Payer: Self-pay

## 2023-11-05 DIAGNOSIS — E119 Type 2 diabetes mellitus without complications: Secondary | ICD-10-CM

## 2023-11-08 ENCOUNTER — Other Ambulatory Visit: Payer: Self-pay

## 2023-11-08 MED ORDER — LINACLOTIDE 290 MCG PO CAPS
290.0000 ug | ORAL_CAPSULE | Freq: Every day | ORAL | 1 refills | Status: AC
Start: 2023-11-08 — End: ?

## 2023-11-12 ENCOUNTER — Ambulatory Visit

## 2023-11-15 ENCOUNTER — Telehealth: Payer: Self-pay

## 2023-11-15 ENCOUNTER — Ambulatory Visit: Payer: Self-pay

## 2023-11-15 NOTE — Telephone Encounter (Signed)
 FYI Only or Action Required?: FYI only for provider: appointment scheduled on 11/18/2023.  Patient was last seen in primary care on 09/30/2023 by Sirivol, Mamatha, MD.  Called Nurse Triage reporting Blister. And headaches  Symptoms began several months ago.  Symptoms are: gradually worsening.  Triage Disposition: See PCP When Office is Open (Within 3 Days)  Patient/caregiver understands and will follow disposition?: Yes         Copied from CRM #[id number]. Topic: Clinical - Red Word Triage >> Nov 15, 2023  9:24 AM Selinda RAMAN wrote: Red Word that prompted transfer to Nurse Triage: The patient called in stating she has been having really bad headaches and her mouth burns really bad every time she eats or drinks anything. I will transfer her to E2[facility] NT Reason for Disposition  Cold sore last > 2 weeks  [1] MILD-MODERATE headache AND [2] present > 3 days (72 hours) AND [3] no improvement after using Care Advice  Answer Assessment - Initial Assessment Questions Pt already has an appointment scheduled with PCP on Mon, 11/17. Pt not wanting to see a different provider or go to UC. This is first available with PCP. This RN educated pt on new-worsening symptoms and when to call back/seek emergent care. Pt verbalized understanding and agrees to plan.   Mouth burning constant Blisters on lip, 3 total; denies pus or fever  8/10 pain level  Onset: a few months, getting worse  Has had them in the past, not sure what it is   Headaches intermittent  Onset: two weeks ago No headache or stiff neck currently  Most of the time right in the front of head  6/10 pain level when it happens with stiff neck  Has had headaches like this in past  Cause: pt thinks it's a sinus infection  Denies fever, double vision, loss of vision  Protocols used: Cold Sores (Fever Blisters)-A-AH, Headache-A-AH

## 2023-11-15 NOTE — Telephone Encounter (Signed)
 Nurse Triage Encounter.   Copied from CRM #[id number]. Topic: Clinical - Red Word Triage >> Nov 15, 2023  9:24 AM Selinda RAMAN wrote: Red Word that prompted transfer to Nurse Triage: The patient called in stating she has been having really bad headaches and her mouth burns really bad every time she eats or drinks anything. I will transfer her to [facility] NT

## 2023-11-18 ENCOUNTER — Ambulatory Visit

## 2023-11-26 ENCOUNTER — Other Ambulatory Visit: Payer: Self-pay

## 2023-12-28 ENCOUNTER — Other Ambulatory Visit: Payer: Self-pay | Admitting: Family Medicine

## 2023-12-30 ENCOUNTER — Other Ambulatory Visit: Payer: Self-pay

## 2023-12-30 MED ORDER — METOCLOPRAMIDE HCL 5 MG PO TABS: 5.0000 mg | ORAL_TABLET | Freq: Two times a day (BID) | ORAL | 1 refills | Status: AC

## 2023-12-30 MED ORDER — MONTELUKAST SODIUM 10 MG PO TABS: 10.0000 mg | ORAL_TABLET | Freq: Every day | ORAL | 2 refills | Status: DC

## 2024-01-01 ENCOUNTER — Ambulatory Visit: Payer: Self-pay

## 2024-01-01 NOTE — Telephone Encounter (Signed)
 FYI Only or Action Required?: FYI only for provider: appointment scheduled on 1/2.  Patient was last seen in primary care on 09/30/2023 by Sirivol, Mamatha, MD.  Called Nurse Triage reporting Mouth Lesions.  Symptoms began several months ago. Worse the last few weeks  Interventions attempted: Nothing.  Symptoms are: gradually worsening.  Triage Disposition: See PCP When Office is Open (Within 3 Days)  Patient/caregiver understands and will follow disposition?: Yes   Copied from CRM #[id number]. Topic: Clinical - Red Word Triage >> Jan 01, 2024 12:15 PM [name] wrote: Red Word that prompted transfer to Nurse Triage: The patient reports having small blisters in the mouth, causing pain and a burning sensation when eating or drinking. Reason for Disposition  Mouth sore (ulcer) lasts > 2 weeks  Answer Assessment - Initial Assessment Questions 1. LOCATION: Where is the mouth sore (ulcer) located?      On tongue and inside of my mouth, just burns and hurts   4. PAIN: Are they painful? If Yes, ask: How bad is it?  (Scale 0-10; or none, mild, moderate, severe)     Just burns and hurts  5. ONSET: When did you first notice the sore?      4-5 months, worse the last few weeks  6. RECURRENT SYMPTOM: Have you had a mouth ulcer before? If Yes, ask: When was the last time? and What happened that time?      Yeah, a while back.   7. CAUSE: What do you think is causing the mouth sore?     Pt unsure  8. OTHER SYMPTOMS: Do you have any other symptoms? (e.g., fever, swollen lymph node)     Denies  Protocols used: Mouth Ulcers-A-AH

## 2024-01-03 ENCOUNTER — Ambulatory Visit: Admitting: Family Medicine

## 2024-01-03 ENCOUNTER — Encounter: Payer: Self-pay | Admitting: Family Medicine

## 2024-01-03 VITALS — BP 136/82 | HR 71 | Temp 98.0°F | Resp 16 | Ht 65.0 in | Wt 203.8 lb

## 2024-01-03 DIAGNOSIS — K1379 Other lesions of oral mucosa: Secondary | ICD-10-CM | POA: Diagnosis not present

## 2024-01-03 DIAGNOSIS — K130 Diseases of lips: Secondary | ICD-10-CM

## 2024-01-03 DIAGNOSIS — Z6833 Body mass index (BMI) 33.0-33.9, adult: Secondary | ICD-10-CM

## 2024-01-03 DIAGNOSIS — J029 Acute pharyngitis, unspecified: Secondary | ICD-10-CM

## 2024-01-03 DIAGNOSIS — E66811 Obesity, class 1: Secondary | ICD-10-CM | POA: Diagnosis not present

## 2024-01-03 DIAGNOSIS — E6609 Other obesity due to excess calories: Secondary | ICD-10-CM | POA: Diagnosis not present

## 2024-01-03 MED ORDER — AMOXICILLIN 875 MG PO TABS: 875.0000 mg | ORAL_TABLET | Freq: Two times a day (BID) | ORAL | 0 refills | Status: AC

## 2024-01-03 MED ORDER — TRIAMCINOLONE ACETONIDE 0.1 % MT PSTE: 1.0000 | PASTE | Freq: Two times a day (BID) | OROMUCOSAL | 12 refills | Status: AC

## 2024-01-03 NOTE — Progress Notes (Signed)
 "  Acute Office Visit  Subjective:    Patient ID: Connie Garrett, female    DOB: [birth year], 62 y.o.   MRN: [id number]  Chief Complaint  Patient presents with   Mouth Lesions    Discussed the use of AI scribe software for clinical note transcription with the patient, who gave verbal consent to proceed.  History of Present Illness   Connie Garrett is a 64 year old female who presents with mouth pain and sores for several days.  Oral pain and sores - Significant mouth pain and burning sensations, especially with eating or drinking - Pain primarily located on the sides of the mouth and tongue, described as 'real sore' - No bleeding from oral sores - Reports no oral sex  - Suspects dentures may contribute to discomfort - Acidic foods and beverages, such as Pepsi, aggravate symptoms  Pharyngitis - Recent onset of sore throat - Difficulty swallowing - No recent illness or contact with anyone that is sick - No cough, fever, or other associated symptoms  Gastrointestinal symptoms - History of constipation - Currently taking Linzess ; current dose is the highest and causes watery stools - Frequent bloating  Obesity - Weight increased from 189 pounds in December 2024 to 203 pounds currently - Concerned about recent weight gain     Past Medical History:  Diagnosis Date   Anxiety    Arthritis    Cataract    Celiac disease    suspected   Colon polyps    Depression    GERD (gastroesophageal reflux disease)    Hypertension    Osteoporosis    Pre-diabetes    Sleep apnea     Past Surgical History:  Procedure Laterality Date   ABDOMINAL HYSTERECTOMY  1992   BACK SURGERY  1990   colonosocpy     ESOPHAGOGASTRODUODENOSCOPY     EYE SURGERY     TOTAL KNEE ARTHROPLASTY Right 07/18/2018   Procedure: TOTAL KNEE ARTHROPLASTY;  Surgeon: Yvone Rush, MD;  Location: [facility];  Service: Orthopedics;  Laterality: Right;   TOTAL KNEE ARTHROPLASTY Left 07/09/2022   Procedure: TOTAL  KNEE ARTHROPLASTY;  Surgeon: Yvone Rush, MD;  Location: [facility];  Service: Orthopedics;  Laterality: Left;   WRIST FRACTURE SURGERY Left    x 2    Family History  Problem Relation Age of Onset   Diabetes Mother    Colon cancer Neg Hx    Colon polyps Neg Hx    Rectal cancer Neg Hx    Esophageal cancer Neg Hx    Stomach cancer Neg Hx     Social History   Socioeconomic History   Marital status: Single    Spouse name: Not on file   Number of children: 2   Years of education: Not on file   Highest education level: Not on file  Occupational History   Occupation: Homemaker  Tobacco Use   Smoking status: Never   Smokeless tobacco: Never  Vaping Use   Vaping status: Never Used  Substance and Sexual Activity   Alcohol  use: Never   Drug use: Never   Sexual activity: Not Currently  Other Topics Concern   Not on file  Social History Narrative   Not on file   Social Drivers of Health   Tobacco Use: Low Risk (01/03/2024)   Patient History    Smoking Tobacco Use: Never    Smokeless Tobacco Use: Never    Passive Exposure: Not on file  Financial Resource Strain: Low Risk (  05/02/2023)   Overall Financial Resource Strain (CARDIA)    Difficulty of Paying Living Expenses: Not hard at all  Food Insecurity: No Food Insecurity (05/02/2023)   Hunger Vital Sign    Worried About Running Out of Food in the Last Year: Never true    Ran Out of Food in the Last Year: Never true  Transportation Needs: No Transportation Needs (05/02/2023)   PRAPARE - Administrator, Civil Service (Medical): No    Lack of Transportation (Non-Medical): No  Physical Activity: Inactive (05/02/2023)   Exercise Vital Sign    Days of Exercise per Week: 0 days    Minutes of Exercise per Session: 0 min  Stress: No Stress Concern Present (05/02/2023)   [employer] of Occupational Health - Occupational Stress Questionnaire    Feeling of Stress : Only a little  Social Connections: Unknown (05/02/2023)   Social  Connection and Isolation Panel    Frequency of Communication with Friends and Family: More than three times a week    Frequency of Social Gatherings with Friends and Family: Three times a week    Attends Religious Services: Never    Active Member of Clubs or Organizations: No    Attends Banker Meetings: Never    Marital Status: Patient declined  Intimate Partner Violence: Not At Risk (05/02/2023)   Humiliation, Afraid, Rape, and Kick questionnaire    Fear of Current or Ex-Partner: No    Emotionally Abused: No    Physically Abused: No    Sexually Abused: No  Depression (PHQ2-9): High Risk (09/30/2023)   Depression (PHQ2-9)    PHQ-2 Score: 11  Alcohol  Screen: Low Risk (05/02/2023)   Alcohol  Screen    Last Alcohol  Screening Score (AUDIT): 0  Housing: Unknown (05/02/2023)   Housing Stability Vital Sign    Unable to Pay for Housing in the Last Year: No    Number of Times Moved in the Last Year: Not on file    Homeless in the Last Year: No  Utilities: Not At Risk (05/02/2023)   [employer] Utilities    Threatened with loss of utilities: No  Health Literacy: Adequate Health Literacy (05/02/2023)   B1300 Health Literacy    Frequency of need for help with medical instructions: Never    Outpatient Medications Prior to Visit  Medication Sig Dispense Refill   albuterol  (VENTOLIN  HFA) 108 (90 Base) MCG/ACT inhaler Inhale 2 puffs into the lungs every 4 (four) hours as needed for wheezing or shortness of breath. 1 each 1   alendronate  (FOSAMAX ) 70 MG tablet Take 1 tablet (70 mg total) by mouth every 7 (seven) days. Take with a full glass of water  on an empty stomach. 4 tablet 11   atorvastatin  (LIPITOR) 20 MG tablet TAKE ONE TABLET BY MOUTH AT BEDTIME 90 tablet 1   Blood Glucose Monitoring Suppl DEVI 1 each by Does not apply route in the morning, at noon, and at bedtime. May substitute to any manufacturer covered by patient's insurance. 1 each 0   buPROPion  (WELLBUTRIN  XL) 150 MG 24 hr tablet Take  1 tablet (150 mg total) by mouth daily. 90 tablet 1   cariprazine  (VRAYLAR ) 1.5 MG capsule Take 1 capsule (1.5 mg total) by mouth daily. 30 capsule 1   ferrous sulfate  325 (65 FE) MG EC tablet Take 1 tablet (325 mg total) by mouth daily with breakfast. 90 tablet 1   furosemide  (LASIX ) 20 MG tablet Take 1 tablet (20 mg total) by mouth daily  as needed for edema. 30 tablet 0   linaclotide  (LINZESS ) 290 MCG CAPS capsule Take 1 capsule (290 mcg total) by mouth daily before breakfast. Take 30 minutes before a meal 90 capsule 1   losartan  (COZAAR ) 25 MG tablet Take 1 tablet (25 mg total) by mouth daily. 90 tablet 1   meloxicam  (MOBIC ) 15 MG tablet Take 1 tablet (15 mg total) by mouth daily. 90 tablet 1   metoCLOPramide  (REGLAN ) 5 MG tablet Take 1 tablet (5 mg total) by mouth 2 (two) times daily. 60 tablet 1   metoprolol  succinate (TOPROL -XL) 25 MG 24 hr tablet Take 1 tablet (25 mg total) by mouth daily. 90 tablet 1   montelukast  (SINGULAIR ) 10 MG tablet Take 1 tablet (10 mg total) by mouth daily. 30 tablet 2   Multiple Vitamin (MULTIVITAMIN WITH MINERALS) TABS tablet Take 1 tablet by mouth daily.     omeprazole  (PRILOSEC) 20 MG capsule Take 1 capsule (20 mg total) by mouth daily. 90 capsule 1   ondansetron  (ZOFRAN ) 4 MG tablet Take 1 tablet (4 mg total) by mouth every 8 (eight) hours as needed for nausea. 15 tablet 0   ONETOUCH ULTRA test strip daily.     potassium chloride  SA (KLOR-CON  M) 20 MEQ tablet TAKE ONE TABLET BY MOUTH EVERY DAY 90 tablet 1   semaglutide -weight management (WEGOVY ) 0.25 MG/0.5ML SOAJ SQ injection Inject 0.25 mg into the skin once a week. 2 mL 3   traZODone  (DESYREL ) 50 MG tablet Take 1 tablet (50 mg total) by mouth at bedtime. 90 tablet 1   venlafaxine  XR (EFFEXOR -XR) 150 MG 24 hr capsule Take 1 capsule (150 mg total) by mouth daily with breakfast. Along with 75 mg XR for total of 225 mg 90 capsule 1   venlafaxine  XR (EFFEXOR -XR) 75 MG 24 hr capsule Take 1 capsule (75 mg total) by  mouth daily with breakfast. To take along with 150 mg XR for total of 225 mg 90 capsule 1   No facility-administered medications prior to visit.    Allergies[1]  Review of Systems  Constitutional:  Negative for chills, diaphoresis, fatigue and fever.  HENT:  Positive for mouth sores, sore throat and trouble swallowing. Negative for congestion, ear pain and sinus pain.   Respiratory:  Negative for cough and shortness of breath.   Cardiovascular:  Negative for chest pain.  Gastrointestinal:  Positive for constipation. Negative for abdominal pain, diarrhea, nausea and vomiting.  Genitourinary:  Negative for dysuria, frequency and urgency.  Musculoskeletal:  Negative for arthralgias.  Neurological:  Negative for weakness and headaches.  Hematological: Negative.   Psychiatric/Behavioral:  Negative for dysphoric mood. The patient is not nervous/anxious.        Objective:        01/03/2024    9:00 AM 09/30/2023    8:52 AM 06/24/2023    8:33 AM  Vitals with BMI  Height 5' 5 5' 5 5' 5  Weight 203 lbs 13 oz 203 lbs 199 lbs  BMI 33.91 33.78 33.12  Systolic 136 106 885  Diastolic 82 70 70  Pulse 71 72 73    No data found.   Physical Exam Vitals reviewed.  Constitutional:      General: She is not in acute distress.    Appearance: Normal appearance. She is not ill-appearing.  HENT:     Mouth/Throat:     Dentition: Has dentures.     Tongue: Lesions (erythema and slightly raised areas) present.     Pharynx:  Pharyngeal swelling and posterior oropharyngeal erythema present.     Tonsils: No tonsillar exudate. 2+ on the right. 2+ on the left.  Cardiovascular:     Rate and Rhythm: Normal rate and regular rhythm.     Heart sounds: Normal heart sounds. No murmur heard. Pulmonary:     Effort: Pulmonary effort is normal. No respiratory distress.     Breath sounds: Normal breath sounds. No wheezing.  Abdominal:     General: Bowel sounds are normal.     Palpations: Abdomen is soft.      Tenderness: There is no abdominal tenderness.  Neurological:     Mental Status: She is alert. Mental status is at baseline.  Psychiatric:        Mood and Affect: Mood normal.        Behavior: Behavior normal.     Health Maintenance Due  Topic Date Due   Zoster Vaccines- Shingrix (1 of 2) Never done   COVID-19 Vaccine (1 - 2025-26 season) Never done   OPHTHALMOLOGY EXAM  11/07/2023    There are no preventive care reminders to display for this patient.   Lab Results  Component Value Date   TSH 4.20 01/12/2021   Lab Results  Component Value Date   WBC 10.1 01/03/2024   HGB 12.3 01/03/2024   HCT 38.0 01/03/2024   MCV 91 01/03/2024   PLT 265 01/03/2024   Lab Results  Component Value Date   NA 139 01/03/2024   K 4.4 01/03/2024   CO2 27 01/03/2024   GLUCOSE 98 01/03/2024   BUN 13 01/03/2024   CREATININE 0.96 01/03/2024   BILITOT 0.5 01/03/2024   ALKPHOS 126 01/03/2024   AST 14 01/03/2024   ALT 16 01/03/2024   PROT 6.9 01/03/2024   ALBUMIN 4.2 01/03/2024   CALCIUM  9.5 01/03/2024   ANIONGAP 10 07/03/2022   EGFR 67 01/03/2024   GFR 76.78 09/28/2020   Lab Results  Component Value Date   CHOL 135 09/30/2023   Lab Results  Component Value Date   HDL 55 09/30/2023   Lab Results  Component Value Date   LDLCALC 63 09/30/2023   Lab Results  Component Value Date   TRIG 93 09/30/2023   Lab Results  Component Value Date   CHOLHDL 2.5 09/30/2023   Lab Results  Component Value Date   HGBA1C 6.2 (H) 09/30/2023        Results for orders placed or performed in visit on 01/03/24  CBC with Differential   Collection Time: 01/03/24  9:29 AM  Result Value Ref Range   WBC 10.1 3.4 - 10.8 x10E3/uL   RBC 4.18 3.77 - 5.28 x10E6/uL   Hemoglobin 12.3 11.1 - 15.9 g/dL   Hematocrit 61.9 65.9 - 46.6 %   MCV 91 79 - 97 fL   MCH 29.4 26.6 - 33.0 pg   MCHC 32.4 31.5 - 35.7 g/dL   RDW 86.5 88.2 - 84.5 %   Platelets 265 150 - 450 x10E3/uL   Neutrophils 60 Not Estab. %    Lymphs 25 Not Estab. %   Monocytes 10 Not Estab. %   Eos 4 Not Estab. %   Basos 1 Not Estab. %   Neutrophils Absolute 6.2 1.4 - 7.0 x10E3/uL   Lymphocytes Absolute 2.5 0.7 - 3.1 x10E3/uL   Monocytes Absolute 1.0 (H) 0.1 - 0.9 x10E3/uL   EOS (ABSOLUTE) 0.4 0.0 - 0.4 x10E3/uL   Basophils Absolute 0.1 0.0 - 0.2 x10E3/uL   Immature Granulocytes 0  Not Estab. %   Immature Grans (Abs) 0.0 0.0 - 0.1 x10E3/uL  B12 and Folate Panel   Collection Time: 01/03/24  9:29 AM  Result Value Ref Range   Vitamin B-12 476 232 - 1,245 pg/mL   Folate >20.0 >3.0 ng/mL  Iron, TIBC and Ferritin Panel   Collection Time: 01/03/24  9:29 AM  Result Value Ref Range   Total Iron Binding Capacity 213 (L) 250 - 450 ug/dL   UIBC 851 881 - 630 ug/dL   Iron 65 27 - 860 ug/dL   Iron Saturation 31 15 - 55 %   Ferritin 127 15 - 150 ng/mL  Comprehensive metabolic panel with GFR   Collection Time: 01/03/24  9:29 AM  Result Value Ref Range   Glucose 98 70 - 99 mg/dL   BUN 13 8 - 27 mg/dL   Creatinine, Ser 9.03 0.57 - 1.00 mg/dL   eGFR 67 >40 fO/fpw/8.26   BUN/Creatinine Ratio 14 12 - 28   Sodium 139 134 - 144 mmol/L   Potassium 4.4 3.5 - 5.2 mmol/L   Chloride 99 96 - 106 mmol/L   CO2 27 20 - 29 mmol/L   Calcium  9.5 8.7 - 10.3 mg/dL   Total Protein 6.9 6.0 - 8.5 g/dL   Albumin 4.2 3.9 - 4.9 g/dL   Globulin, Total 2.7 1.5 - 4.5 g/dL   Bilirubin Total 0.5 0.0 - 1.2 mg/dL   Alkaline Phosphatase 126 49 - 135 IU/L   AST 14 0 - 40 IU/L   ALT 16 0 - 32 IU/L     Assessment & Plan:   Assessment & Plan Mouth sores Oral mucosal lesions Persistent lesions with burning and pain, likely due to irritation from dentures or acidic foods. No signs of thrush. Oral mucosa and tongue erythematous and slightly swollen.  - Labs drawn to check for other causes - Tried to prescribe lidocaine  or magic mouthwash and allergy to ingredient in product. - Prescribe dental paste - 1 application to mouth and oral sores TWICE A DAY as  needed.  - Advised avoidance of acidic foods and beverages. - Recommended dental follow-up for denture assessment. - If symptoms persist we will swab oral lesions for culture. Orders:   CBC with Differential   B12 and Folate Panel   Iron, TIBC and Ferritin Panel   Comprehensive metabolic panel with GFR   triamcinolone  (KENALOG ) 0.1 % paste; Use as directed 1 Application in the mouth or throat 2 (two) times daily.  Pharyngitis, unspecified etiology Acute pharyngitis Sore throat with difficulty swallowing, possible bacterial cause. - Prescribed amoxicillin  500 mg twice daily for 5 days.  Orders:   amoxicillin  (AMOXIL ) 875 MG tablet; Take 1 tablet (875 mg total) by mouth 2 (two) times daily for 5 days.  Cheilitis Presents with angular cheilitis with cracking, soreness, burning, and pain for several days. Differential diagnosis includes dehydration and possible yeast infection from constant lip licking. Discussed risks of untreated condition, including worsening dryness and potential infection. Benefits of hydration and lip balm use include symptom relief and prevention of complications.  - Recommend staying well hydrated   - Apply a good lip balm such as Burt's Bees several times a day   - Continue ketoconazole -steroid combination cream as prescribed previously.     Class 1 obesity due to excess calories with serious comorbidity and body mass index (BMI) of 33.0 to 33.9 in adult BMI 33.91, not at goal - Recommend continue working on diet and exercise  Follow-up: Return if symptoms worsen or fail to improve.  An After Visit Summary was printed and given to the patient.  Harrie Cedar, FNP [facility] [phone]      [1]  Allergies Allergen Reactions   Anise Extract [Flavoring Agent (Non-Screening)] Hives and Swelling   Flavoring Agent Hives and Swelling   "

## 2024-01-04 ENCOUNTER — Ambulatory Visit: Payer: Self-pay | Admitting: Family Medicine

## 2024-01-04 DIAGNOSIS — J029 Acute pharyngitis, unspecified: Secondary | ICD-10-CM | POA: Insufficient documentation

## 2024-01-04 DIAGNOSIS — K1379 Other lesions of oral mucosa: Secondary | ICD-10-CM | POA: Insufficient documentation

## 2024-01-04 DIAGNOSIS — E66811 Obesity, class 1: Secondary | ICD-10-CM | POA: Insufficient documentation

## 2024-01-04 LAB — CBC WITH DIFFERENTIAL/PLATELET
Basophils Absolute: 0.1 x10E3/uL (ref 0.0–0.2)
Basos: 1 %
EOS (ABSOLUTE): 0.4 x10E3/uL (ref 0.0–0.4)
Eos: 4 %
Hematocrit: 38 % (ref 34.0–46.6)
Hemoglobin: 12.3 g/dL (ref 11.1–15.9)
Immature Grans (Abs): 0 x10E3/uL (ref 0.0–0.1)
Immature Granulocytes: 0 %
Lymphocytes Absolute: 2.5 x10E3/uL (ref 0.7–3.1)
Lymphs: 25 %
MCH: 29.4 pg (ref 26.6–33.0)
MCHC: 32.4 g/dL (ref 31.5–35.7)
MCV: 91 fL (ref 79–97)
Monocytes Absolute: 1 x10E3/uL — ABNORMAL HIGH (ref 0.1–0.9)
Monocytes: 10 %
Neutrophils Absolute: 6.2 x10E3/uL (ref 1.4–7.0)
Neutrophils: 60 %
Platelets: 265 x10E3/uL (ref 150–450)
RBC: 4.18 x10E6/uL (ref 3.77–5.28)
RDW: 13.4 % (ref 11.7–15.4)
WBC: 10.1 x10E3/uL (ref 3.4–10.8)

## 2024-01-04 LAB — COMPREHENSIVE METABOLIC PANEL WITH GFR
ALT: 16 IU/L (ref 0–32)
AST: 14 IU/L (ref 0–40)
Albumin: 4.2 g/dL (ref 3.9–4.9)
Alkaline Phosphatase: 126 IU/L (ref 49–135)
BUN/Creatinine Ratio: 14 (ref 12–28)
BUN: 13 mg/dL (ref 8–27)
Bilirubin Total: 0.5 mg/dL (ref 0.0–1.2)
CO2: 27 mmol/L (ref 20–29)
Calcium: 9.5 mg/dL (ref 8.7–10.3)
Chloride: 99 mmol/L (ref 96–106)
Creatinine, Ser: 0.96 mg/dL (ref 0.57–1.00)
Globulin, Total: 2.7 g/dL (ref 1.5–4.5)
Glucose: 98 mg/dL (ref 70–99)
Potassium: 4.4 mmol/L (ref 3.5–5.2)
Sodium: 139 mmol/L (ref 134–144)
Total Protein: 6.9 g/dL (ref 6.0–8.5)
eGFR: 67 mL/min/1.73

## 2024-01-04 LAB — B12 AND FOLATE PANEL
Folate: >20 ng/mL
Vitamin B-12: 476 pg/mL (ref 232–1245)

## 2024-01-04 LAB — IRON,TIBC AND FERRITIN PANEL
Ferritin: 127 ng/mL (ref 15–150)
Iron Saturation: 31 % (ref 15–55)
Iron: 65 ug/dL (ref 27–139)
Total Iron Binding Capacity: 213 ug/dL — ABNORMAL LOW (ref 250–450)
UIBC: 148 ug/dL (ref 118–369)

## 2024-01-04 NOTE — Assessment & Plan Note (Signed)
 Presents with angular cheilitis with cracking, soreness, burning, and pain for several days. Differential diagnosis includes dehydration and possible yeast infection from constant lip licking. Discussed risks of untreated condition, including worsening dryness and potential infection. Benefits of hydration and lip balm use include symptom relief and prevention of complications.  - Recommend staying well hydrated   - Apply a good lip balm such as Burt's Bees several times a day   - Continue ketoconazole -steroid combination cream as prescribed previously.

## 2024-01-04 NOTE — Assessment & Plan Note (Signed)
 BMI 33.91, not at goal - Recommend continue working on diet and exercise

## 2024-01-04 NOTE — Assessment & Plan Note (Addendum)
 Oral mucosal lesions Persistent lesions with burning and pain, likely due to irritation from dentures or acidic foods. No signs of thrush. Oral mucosa and tongue erythematous and slightly swollen.  - Labs drawn to check for other causes - Tried to prescribe lidocaine  or magic mouthwash and allergy to ingredient in product. - Prescribe dental paste - 1 application to mouth and oral sores TWICE A DAY as needed.  - Advised avoidance of acidic foods and beverages. - Recommended dental follow-up for denture assessment. - If symptoms persist we will swab oral lesions for culture. Orders:   CBC with Differential   B12 and Folate Panel   Iron, TIBC and Ferritin Panel   Comprehensive metabolic panel with GFR   triamcinolone  (KENALOG ) 0.1 % paste; Use as directed 1 Application in the mouth or throat 2 (two) times daily.

## 2024-01-04 NOTE — Assessment & Plan Note (Addendum)
 Acute pharyngitis Sore throat with difficulty swallowing, possible bacterial cause. - Prescribed amoxicillin  500 mg twice daily for 5 days.  Orders:   amoxicillin  (AMOXIL ) 875 MG tablet; Take 1 tablet (875 mg total) by mouth 2 (two) times daily for 5 days.

## 2024-01-06 ENCOUNTER — Ambulatory Visit: Admitting: Physician Assistant

## 2024-01-06 NOTE — Progress Notes (Deleted)
 "     Connie Console, Connie Garrett [address] [place], [place]  [address] Phone: [phone]   Primary Care Physician: Sirivol, Mamatha, MD  Primary Gastroenterologist:  Connie Console, Connie Garrett / ***  Chief Complaint: Follow-up chronic constipation and celiac disease       HPI:   Discussed the use of AI scribe software for clinical note transcription with the patient, who gave verbal consent to proceed.  I last saw patient 05/2023 for follow-up of chronic constipation and celiac disease.  Also has history of GERD and history of adenomatous colon polyps.  She restarted samples of Linzess  145 and 290.  Continued on omeprazole  20 mg daily for GERD.  Strict gluten-free diet.  Was referred to nutritionist for more support.  05/2023 labs: TTG IgA less than 2 (normal).  Deamidated gliadin IgA mild elevated (31).  Vitamins A, D, E, K, B12, and iron levels were all normal.  CBC normal.  Hemoglobin 12.2.  01/03/2024 most recent labs: Normal CBC, CMP, iron, B12, and folate.  Hgb 12.3.  History of Present Illness    Previously treated with MiraLAX , Amitiza, Linzess , and Trulance.  History of iron deficiency anemia and is taking oral iron.  She has had difficulty with constipation alternating with loose stools.  Took Ozempic which caused GI side effects.  History of GERD and dysphagia controlled on omeprazole  20 Mg daily.  EGD showed no evidence of esophageal stricture.  Underwent empiric dilation.   01/2022 EGD by Dr. Leigh: 1 cm hiatal hernia, normal stomach and duodenum.  Mild flattening in the duodenal bulb with a few areas of mild scalloped mucosa in the second portion of duodenum.  Biopsies were consistent with celiac.   01/2022 colonoscopy by Dr. Leigh: 3 small (3 mm to 4 mm) tubular adenoma polyps removed.  Sigmoid diverticulosis.  Small internal hemorrhoids.  Adequate prep.  3-year repeat (due 01/2025).   11/2020 EGD: showed fairly typical changes for celiac disease and pathology was  consistent with Celiac. Had positive DQ 2 genetic test, but DQ 8 was negative.  Other serologic testing was negative as well.   11/2020 Colonoscopy by DR. Armbruster:  12 polyps, 11 of them were adenomas, she was told to repeat a colonoscopy in 1 year for surveillance.  She denies any family history of colon cancer.   Current Outpatient Medications  Medication Sig Dispense Refill   albuterol  (VENTOLIN  HFA) 108 (90 Base) MCG/ACT inhaler Inhale 2 puffs into the lungs every 4 (four) hours as needed for wheezing or shortness of breath. 1 each 1   alendronate  (FOSAMAX ) 70 MG tablet Take 1 tablet (70 mg total) by mouth every 7 (seven) days. Take with a full glass of water  on an empty stomach. 4 tablet 11   amoxicillin  (AMOXIL ) 875 MG tablet Take 1 tablet (875 mg total) by mouth 2 (two) times daily for 5 days. 10 tablet 0   atorvastatin  (LIPITOR) 20 MG tablet TAKE ONE TABLET BY MOUTH AT BEDTIME 90 tablet 1   Blood Glucose Monitoring Suppl DEVI 1 each by Does not apply route in the morning, at noon, and at bedtime. May substitute to any manufacturer covered by patient's insurance. 1 each 0   buPROPion  (WELLBUTRIN  XL) 150 MG 24 hr tablet Take 1 tablet (150 mg total) by mouth daily. 90 tablet 1   cariprazine  (VRAYLAR ) 1.5 MG capsule Take 1 capsule (1.5 mg total) by mouth daily. 30 capsule 1   ferrous sulfate  325 (65 FE) MG EC tablet Take  1 tablet (325 mg total) by mouth daily with breakfast. 90 tablet 1   furosemide  (LASIX ) 20 MG tablet Take 1 tablet (20 mg total) by mouth daily as needed for edema. 30 tablet 0   linaclotide  (LINZESS ) 290 MCG CAPS capsule Take 1 capsule (290 mcg total) by mouth daily before breakfast. Take 30 minutes before a meal 90 capsule 1   losartan  (COZAAR ) 25 MG tablet Take 1 tablet (25 mg total) by mouth daily. 90 tablet 1   meloxicam  (MOBIC ) 15 MG tablet Take 1 tablet (15 mg total) by mouth daily. 90 tablet 1   metoCLOPramide  (REGLAN ) 5 MG tablet Take 1 tablet (5 mg total) by mouth  2 (two) times daily. 60 tablet 1   metoprolol  succinate (TOPROL -XL) 25 MG 24 hr tablet Take 1 tablet (25 mg total) by mouth daily. 90 tablet 1   montelukast  (SINGULAIR ) 10 MG tablet Take 1 tablet (10 mg total) by mouth daily. 30 tablet 2   Multiple Vitamin (MULTIVITAMIN WITH MINERALS) TABS tablet Take 1 tablet by mouth daily.     omeprazole  (PRILOSEC) 20 MG capsule Take 1 capsule (20 mg total) by mouth daily. 90 capsule 1   ondansetron  (ZOFRAN ) 4 MG tablet Take 1 tablet (4 mg total) by mouth every 8 (eight) hours as needed for nausea. 15 tablet 0   ONETOUCH ULTRA test strip daily.     potassium chloride  SA (KLOR-CON  M) 20 MEQ tablet TAKE ONE TABLET BY MOUTH EVERY DAY 90 tablet 1   semaglutide -weight management (WEGOVY ) 0.25 MG/0.5ML SOAJ SQ injection Inject 0.25 mg into the skin once a week. 2 mL 3   traZODone  (DESYREL ) 50 MG tablet Take 1 tablet (50 mg total) by mouth at bedtime. 90 tablet 1   triamcinolone  (KENALOG ) 0.1 % paste Use as directed 1 Application in the mouth or throat 2 (two) times daily. 5 g 12   venlafaxine  XR (EFFEXOR -XR) 150 MG 24 hr capsule Take 1 capsule (150 mg total) by mouth daily with breakfast. Along with 75 mg XR for total of 225 mg 90 capsule 1   venlafaxine  XR (EFFEXOR -XR) 75 MG 24 hr capsule Take 1 capsule (75 mg total) by mouth daily with breakfast. To take along with 150 mg XR for total of 225 mg 90 capsule 1   No current facility-administered medications for this visit.    Allergies as of 01/06/2024 - Review Complete 01/03/2024  Allergen Reaction Noted   Anise extract [flavoring agent (non-screening)] Hives and Swelling 01/03/2022   Flavoring agent Hives and Swelling 01/03/2022    Past Medical History:  Diagnosis Date   Anxiety    Arthritis    Cataract    Celiac disease    suspected   Colon polyps    Depression    GERD (gastroesophageal reflux disease)    Hypertension    Osteoporosis    Pre-diabetes    Sleep apnea     Past Surgical History:   Procedure Laterality Date   ABDOMINAL HYSTERECTOMY  1992   BACK SURGERY  1990   colonosocpy     ESOPHAGOGASTRODUODENOSCOPY     EYE SURGERY     TOTAL KNEE ARTHROPLASTY Right 07/18/2018   Procedure: TOTAL KNEE ARTHROPLASTY;  Surgeon: Yvone Rush, MD;  Location: [facility];  Service: Orthopedics;  Laterality: Right;   TOTAL KNEE ARTHROPLASTY Left 07/09/2022   Procedure: TOTAL KNEE ARTHROPLASTY;  Surgeon: Yvone Rush, MD;  Location: [facility];  Service: Orthopedics;  Laterality: Left;   WRIST FRACTURE SURGERY Left  x 2    Review of Systems:    All systems reviewed and negative except where noted in HPI.    Physical Exam:  There were no vitals taken for this visit. No LMP recorded. Patient has had a hysterectomy.  General: Well-nourished, well-developed in no acute distress.  Lungs: Clear to auscultation bilaterally. Non-labored. Heart: Regular rate and rhythm, no murmurs rubs or gallops.  Abdomen: Bowel sounds are normal; Abdomen is Soft; No hepatosplenomegaly, masses or hernias;  No Abdominal Tenderness; No guarding or rebound tenderness. Neuro: Alert and oriented x 3.  Grossly intact.  Psych: Alert and cooperative, normal mood and affect.   Imaging Studies: No results found.  Labs: CBC    Component Value Date/Time   WBC 10.1 01/03/2024 0929   WBC 8.8 05/10/2023 1440   RBC 4.18 01/03/2024 0929   RBC 4.18 05/10/2023 1440   HGB 12.3 01/03/2024 0929   HCT 38.0 01/03/2024 0929   PLT 265 01/03/2024 0929   MCV 91 01/03/2024 0929   MCH 29.4 01/03/2024 0929   MCH 29.3 07/03/2022 1121   MCHC 32.4 01/03/2024 0929   MCHC 33.6 05/10/2023 1440   RDW 13.4 01/03/2024 0929   LYMPHSABS 2.5 01/03/2024 0929   MONOABS 0.9 05/10/2023 1440   EOSABS 0.4 01/03/2024 0929   BASOSABS 0.1 01/03/2024 0929    CMP     Component Value Date/Time   NA 139 01/03/2024 0929   K 4.4 01/03/2024 0929   CL 99 01/03/2024 0929   CO2 27 01/03/2024 0929   GLUCOSE 98 01/03/2024 0929   GLUCOSE 100 (H)  07/03/2022 1121   BUN 13 01/03/2024 0929   CREATININE 0.96 01/03/2024 0929   CALCIUM  9.5 01/03/2024 0929   PROT 6.9 01/03/2024 0929   ALBUMIN 4.2 01/03/2024 0929   AST 14 01/03/2024 0929   ALT 16 01/03/2024 0929   ALKPHOS 126 01/03/2024 0929   BILITOT 0.5 01/03/2024 0929   GFRNONAA >60 07/03/2022 1121   GFRAA >60 07/15/2018 1047       Assessment and Plan:   Connie Garrett is a 64 y.o. y/o female   1.  Celiac disease - Continue gluten-free diet. - Recent normal labs. - Follow-up with PCP for bone density test. - Follow-up with GI once per year.  2.  Chronic constipation -Continue Linzess ?  3.  GERD -Continue omeprazole  20 mg daily.  4.  History of adenomatous colon polyps - 3 repeat colonoscopy will be due 01/2025.  Assessment and Plan Assessment & Plan       Connie Console, Connie Garrett  Follow up ***   "

## 2024-01-24 ENCOUNTER — Other Ambulatory Visit: Payer: Self-pay | Admitting: Family Medicine

## 2024-01-24 ENCOUNTER — Other Ambulatory Visit: Payer: Self-pay

## 2024-01-24 MED ORDER — MONTELUKAST SODIUM 10 MG PO TABS: 10.0000 mg | ORAL_TABLET | Freq: Every day | ORAL | 2 refills | Status: AC

## 2024-01-24 NOTE — Telephone Encounter (Signed)
 Copied from CRM [id number]. Topic: Clinical - Medication Refill >> Jan 24, 2024  9:43 AM Zebedee SAUNDERS wrote: Medication: montelukast  (SINGULAIR ) 10 MG tablet  Has the patient contacted their pharmacy? Yes (Agent: If no, request that the patient contact the pharmacy for the refill. If patient does not wish to contact the pharmacy document the reason why and proceed with request.) (Agent: If yes, when and what did the pharmacy advise?)  This is the patient's preferred pharmacy:  [facility] - [place], [place] - [address] [place] [address] Phone: [phone] Fax: [phone]  Is this the correct pharmacy for this prescription? Yes If no, delete pharmacy and type the correct one.   Has the prescription been filled recently? Yes  Is the patient out of the medication? Yes  Has the patient been seen for an appointment in the last year OR does the patient have an upcoming appointment? Yes  Can we respond through MyChart? Yes  Agent: Please be advised that Rx refills may take up to 3 business days. We ask that you follow-up with your pharmacy.

## 2024-01-30 ENCOUNTER — Ambulatory Visit

## 2024-01-30 ENCOUNTER — Other Ambulatory Visit: Payer: Self-pay

## 2024-01-30 MED ORDER — BUPROPION HCL ER (XL) 150 MG PO TB24: 150.0000 mg | ORAL_TABLET | Freq: Every day | ORAL | 0 refills | Status: AC

## 2024-02-04 ENCOUNTER — Ambulatory Visit

## 2024-02-05 ENCOUNTER — Ambulatory Visit: Admitting: Physician Assistant

## 2024-02-06 ENCOUNTER — Encounter: Payer: Self-pay | Admitting: *Deleted

## 2024-02-06 NOTE — Progress Notes (Signed)
 ICEL CASTLES                                          MRN: [id number]   02/06/2024   The [facility] Quality Team Specialist reviewed this patient medical record for the purposes of chart review for care gap closure. The following were reviewed: chart review for care gap closure-diabetic eye exam.    [facility] Quality Team

## 2024-02-10 ENCOUNTER — Ambulatory Visit

## 2024-02-13 ENCOUNTER — Ambulatory Visit

## 2024-02-17 ENCOUNTER — Ambulatory Visit

## 2024-03-02 ENCOUNTER — Ambulatory Visit: Admitting: Physician Assistant

## 2024-05-07 ENCOUNTER — Ambulatory Visit
